# Patient Record
Sex: Male | Born: 1990 | Race: Black or African American | Hispanic: No | Smoking: Current every day smoker
Health system: Southern US, Community
[De-identification: ages and names within clinical notes are randomized; demographics above are authoritative.]

## PROBLEM LIST (undated history)

## (undated) ENCOUNTER — Emergency Department (HOSPITAL_COMMUNITY): Discharge status: Against Medical Advice | Payer: Self-pay

## (undated) DIAGNOSIS — F419 Anxiety disorder, unspecified: Secondary | ICD-10-CM

## (undated) DIAGNOSIS — F329 Major depressive disorder, single episode, unspecified: Secondary | ICD-10-CM

## (undated) DIAGNOSIS — I319 Disease of pericardium, unspecified: Secondary | ICD-10-CM

## (undated) DIAGNOSIS — F209 Schizophrenia, unspecified: Secondary | ICD-10-CM

## (undated) DIAGNOSIS — R079 Chest pain, unspecified: Secondary | ICD-10-CM

## (undated) DIAGNOSIS — F1020 Alcohol dependence, uncomplicated: Secondary | ICD-10-CM

## (undated) DIAGNOSIS — F32A Depression, unspecified: Secondary | ICD-10-CM

## (undated) HISTORY — DX: Alcohol dependence, uncomplicated: F10.20

## (undated) HISTORY — DX: Chest pain, unspecified: R07.9

## (undated) HISTORY — DX: Disease of pericardium, unspecified: I31.9

---

## 2011-12-08 ENCOUNTER — Emergency Department (HOSPITAL_COMMUNITY)
Admission: EM | Admit: 2011-12-08 | Discharge: 2011-12-08 | Disposition: A | Discharge status: Home / Self Care | Payer: Medicaid Other | Attending: Emergency Medicine | Admitting: Emergency Medicine

## 2011-12-08 ENCOUNTER — Encounter (HOSPITAL_COMMUNITY): Payer: Self-pay | Admitting: Emergency Medicine

## 2011-12-08 ENCOUNTER — Emergency Department (HOSPITAL_COMMUNITY): Payer: Medicaid Other

## 2011-12-08 DIAGNOSIS — R079 Chest pain, unspecified: Secondary | ICD-10-CM | POA: Insufficient documentation

## 2011-12-08 DIAGNOSIS — I319 Disease of pericardium, unspecified: Secondary | ICD-10-CM | POA: Insufficient documentation

## 2011-12-08 LAB — POCT I-STAT, CHEM 8
BUN: 13 mg/dL (ref 6–23)
Calcium, Ion: 1.15 mmol/L (ref 1.12–1.23)
Chloride: 103 mEq/L (ref 96–112)
Glucose, Bld: 84 mg/dL (ref 70–99)
TCO2: 23 mmol/L (ref 0–100)

## 2011-12-08 LAB — CBC WITH DIFFERENTIAL/PLATELET
Basophils Absolute: 0 10*3/uL (ref 0.0–0.1)
Basophils Relative: 1 % (ref 0–1)
Eosinophils Relative: 2 % (ref 0–5)
Lymphs Abs: 1.4 10*3/uL (ref 0.7–4.0)
MCH: 32.4 pg (ref 26.0–34.0)
Monocytes Absolute: 0.5 10*3/uL (ref 0.1–1.0)
Monocytes Relative: 10 % (ref 3–12)
Neutro Abs: 2.4 10*3/uL (ref 1.7–7.7)
Neutrophils Relative %: 55 % (ref 43–77)
Platelets: 116 10*3/uL — ABNORMAL LOW (ref 150–400)
RBC: 4.5 MIL/uL (ref 4.22–5.81)
WBC: 4.4 10*3/uL (ref 4.0–10.5)

## 2011-12-08 LAB — TROPONIN I: Troponin I: <0.3 ng/mL (ref <0.30)

## 2011-12-08 MED ORDER — NAPROXEN 375 MG PO TABS: 375.0000 mg | ORAL_TABLET | Freq: Two times a day (BID) | ORAL | Status: DC

## 2011-12-08 NOTE — ED Notes (Signed)
Report received-airway intact-no s/s's of distress-will continue to monitor 

## 2011-12-08 NOTE — ED Notes (Addendum)
Pt alert, nad,  arrives from home, c/o left rib pain, onset was 1 week  ago, denies trauma or injury, resp even unlabored, skin pwd, ambulates to triage, steady gait noted

## 2011-12-08 NOTE — ED Provider Notes (Signed)
History     CSN: [id number]  Arrival date & time 12/08/11  8295   First MD Initiated Contact with Patient 12/08/11 0542      Chief Complaint  Patient presents with  . Illegal value: [    Rib Pain    (Consider location/radiation/quality/duration/timing/severity/associated sxs/prior treatment) HPI  21 year old  male presents complaining of left rib pain. Patient reports for the past week he has been experiencing intermittent chest pain. Describe pain as a sharp stabbing sensation lasting for seconds and returned 30 minutes later pain initially started on his right chest but now has migrated over to his left chest. Nonradiating, with no associated nausea, diaphoresis, or shortness of breath. Pain is not alleviate or aggravate with positional change. As fever, chills, nausea, vomiting, diarrhea, abdominal pain, back pain, or rash. He denies any specific trauma.  No family history of premature cardiac death.  No hx of HTN or diabetes.  Patient denies IV drug use.  No hx of immunocompromise.  Pt initially took Peptol bismo thinking that it may be related to heart burn but it provides no relief.    History reviewed. No pertinent past medical history.  History reviewed. No pertinent past surgical history.  No family history on file.  History  Substance Use Topics  . Smoking status: Current Every Day Smoker    Types: Cigarettes  . Smokeless tobacco: Not on file  . Alcohol Use:       Review of Systems  All other systems reviewed and are negative.    Allergies  Review of patient's allergies indicates no known allergies.  Home Medications  No current outpatient prescriptions on file.  BP 116/72  Pulse 63  Temp 98.8 F (37.1 C)  Resp 17  Wt 140 lb (63.504 kg)  SpO2 98%  Physical Exam  Nursing note and vitals reviewed. Constitutional: He is oriented to person, place, and time. He appears well-developed and well-nourished. No distress.       Awake, alert, nontoxic  appearance  HENT:  Head: Atraumatic.  Eyes: Conjunctivae normal are normal. Right eye exhibits no discharge. Left eye exhibits no discharge.  Neck: Normal range of motion. Neck supple.  Cardiovascular: Normal rate and regular rhythm.  Exam reveals no gallop and no friction rub.   No murmur heard. Pulmonary/Chest: Effort normal. No respiratory distress. He exhibits no tenderness.  Abdominal: Soft. There is no tenderness. There is no rebound.  Musculoskeletal: He exhibits no edema and no tenderness.       ROM appears intact, no obvious focal weakness  Neurological: He is alert and oriented to person, place, and time.  Skin: Skin is warm and dry. No rash noted.  Psychiatric: He has a normal mood and affect.    ED Course  Procedures (including critical care time)  Labs Reviewed - No data to display Dg Chest 2 View  12/08/2011  *RADIOLOGY REPORT*  Clinical Data: Chest pain  CHEST - 2 VIEW  Comparison: None.  Findings: Lungs are clear. No pleural effusion or pneumothorax.  Cardiomediastinal silhouette is within normal limits.  Visualized osseous structures are within normal limits.  IMPRESSION: Normal chest radiographs.   Original Report Authenticated By: Charline Bills, M.D.     Results for orders placed during the hospital encounter of 12/08/11  TROPONIN I      Component Value Range   Troponin I <0.30  <0.30 ng/mL  CBC WITH DIFFERENTIAL      Component Value Range   WBC 4.4  4.0 - 10.5 K/uL   RBC 4.50  4.22 - 5.81 MIL/uL   Hemoglobin 14.6  13.0 - 17.0 g/dL   HCT 40.9  81.1 - 91.4 %   MCV 89.6  78.0 - 100.0 fL   MCH 32.4  26.0 - 34.0 pg   MCHC 36.2 (*) 30.0 - 36.0 g/dL   RDW 78.2  95.6 - 21.3 %   Platelets PENDING  150 - 400 K/uL   Neutrophils Relative 55  43 - 77 %   Neutro Abs 2.4  1.7 - 7.7 K/uL   Lymphocytes Relative 33  12 - 46 %   Lymphs Abs 1.4  0.7 - 4.0 K/uL   Monocytes Relative 10  3 - 12 %   Monocytes Absolute 0.5  0.1 - 1.0 K/uL   Eosinophils Relative 2  0 - 5 %     Eosinophils Absolute 0.1  0.0 - 0.7 K/uL   Basophils Relative 1  0 - 1 %   Basophils Absolute 0.0  0.0 - 0.1 K/uL  POCT I-STAT, CHEM 8      Component Value Range   Sodium 140  135 - 145 mEq/L   Potassium 3.6  3.5 - 5.1 mEq/L   Chloride 103  96 - 112 mEq/L   BUN 13  6 - 23 mg/dL   Creatinine, Ser 0.86  0.50 - 1.35 mg/dL   Glucose, Bld 84  70 - 99 mg/dL   Calcium, Ion 5.78  4.69 - 1.23 mmol/L   TCO2 23  0 - 100 mmol/L   Hemoglobin 14.6  13.0 - 17.0 g/dL   HCT 62.9  52.8 - 41.3 %   Dg Chest 2 View  12/08/2011  *RADIOLOGY REPORT*  Clinical Data: Chest pain  CHEST - 2 VIEW  Comparison: None.  Findings: Lungs are clear. No pleural effusion or pneumothorax.  Cardiomediastinal silhouette is within normal limits.  Visualized osseous structures are within normal limits.  IMPRESSION: Normal chest radiographs.   Original Report Authenticated By: Charline Bills, M.D.        Date: 12/08/2011  Rate: 74  Rhythm: normal sinus rhythm  QRS Axis: normal  Intervals: normal  ST/T Wave abnormalities: nonspecific ST/T changes  Conduction Disutrbances:none  Narrative Interpretation: diffused ST elevation suggests pericarditis  Old EKG Reviewed: none available  1. pericarditis  MDM  Presents with atypical chest pain. Pain is nonreproducible on exam. EKG shows diffuse ST elevation which suggested pericarditis. However, patient reports no symptoms changes with positional changes.  He is currently chest pain free.  Is afebrile and VSS.  No other red flags finding.     7:19 AM Pt remains CP free with normal stable VS.  Since ECG changes suggestive of pericarditis, but pt has normal WBC, normal electrolytes, normal troponin and no active pain, will d/c with NSAIDs.  Strict return precaution.  Will refer to cardiology for further care.    BP 116/72  Pulse 63  Temp 98.8 F (37.1 C)  Resp 17  Wt 140 lb (63.504 kg)  SpO2 98%         Fayrene Helper, PA-C 12/08/11 [phone]

## 2011-12-09 NOTE — ED Provider Notes (Signed)
Medical screening examination/treatment/procedure(s) were performed by non-physician practitioner and as supervising physician I was immediately available for consultation/collaboration.  Tobin Chad, MD 12/09/11 [phone]

## 2012-01-03 ENCOUNTER — Encounter: Payer: Self-pay | Admitting: *Deleted

## 2012-01-03 ENCOUNTER — Ambulatory Visit: Payer: Self-pay | Admitting: Cardiology

## 2012-01-03 ENCOUNTER — Encounter: Payer: Self-pay | Admitting: Cardiology

## 2015-07-24 ENCOUNTER — Encounter (HOSPITAL_COMMUNITY): Payer: Self-pay | Admitting: *Deleted

## 2015-07-24 ENCOUNTER — Emergency Department (HOSPITAL_COMMUNITY)
Admission: EM | Admit: 2015-07-24 | Discharge: 2015-07-24 | Disposition: A | Discharge status: Home / Self Care | Payer: Medicaid Other | Attending: Emergency Medicine | Admitting: Emergency Medicine

## 2015-07-24 ENCOUNTER — Emergency Department (HOSPITAL_COMMUNITY): Payer: Medicaid Other

## 2015-07-24 DIAGNOSIS — Y929 Unspecified place or not applicable: Secondary | ICD-10-CM | POA: Diagnosis not present

## 2015-07-24 DIAGNOSIS — F17210 Nicotine dependence, cigarettes, uncomplicated: Secondary | ICD-10-CM | POA: Diagnosis not present

## 2015-07-24 DIAGNOSIS — Z791 Long term (current) use of non-steroidal anti-inflammatories (NSAID): Secondary | ICD-10-CM | POA: Insufficient documentation

## 2015-07-24 DIAGNOSIS — Y939 Activity, unspecified: Secondary | ICD-10-CM | POA: Insufficient documentation

## 2015-07-24 DIAGNOSIS — Y999 Unspecified external cause status: Secondary | ICD-10-CM | POA: Insufficient documentation

## 2015-07-24 DIAGNOSIS — W25XXXA Contact with sharp glass, initial encounter: Secondary | ICD-10-CM | POA: Diagnosis not present

## 2015-07-24 DIAGNOSIS — S61411A Laceration without foreign body of right hand, initial encounter: Secondary | ICD-10-CM

## 2015-07-24 DIAGNOSIS — Z23 Encounter for immunization: Secondary | ICD-10-CM | POA: Diagnosis not present

## 2015-07-24 MED ORDER — BACITRACIN ZINC 500 UNIT/GM EX OINT: 1.0000 "application " | TOPICAL_OINTMENT | Freq: Two times a day (BID) | CUTANEOUS | Status: DC

## 2015-07-24 MED ORDER — LIDOCAINE HCL 2 % IJ SOLN
INTRAMUSCULAR | Status: AC
  Administered 2015-07-24: 400 mg
  Filled 2015-07-24: qty 20

## 2015-07-24 MED ORDER — TETANUS-DIPHTH-ACELL PERTUSSIS 5-2.5-18.5 LF-MCG/0.5 IM SUSP
0.5000 mL | Freq: Once | INTRAMUSCULAR | Status: AC
  Administered 2015-07-24: 0.5 mL via INTRAMUSCULAR
  Filled 2015-07-24: qty 0.5

## 2015-07-24 MED ORDER — NAPROXEN 500 MG PO TABS: 500.0000 mg | ORAL_TABLET | Freq: Two times a day (BID) | ORAL | Status: DC

## 2015-07-24 NOTE — Discharge Instructions (Signed)

## 2015-07-24 NOTE — ED Notes (Signed)
Patient d/c'd self care.  F/U and medications discussed.  Patient verbalized understanding. 

## 2015-07-24 NOTE — ED Notes (Signed)
Pt states that he cut his right hand on a glass candle holder that broke; pt with laceration to rt palm and rt ring finger

## 2015-07-24 NOTE — ED Provider Notes (Signed)
CSN: [id number]     Arrival date & time 07/24/15  0133 History   First MD Initiated Contact with Patient 07/24/15 0258     Chief Complaint  Patient presents with  . Extremity Laceration     (Consider location/radiation/quality/duration/timing/severity/associated sxs/prior Treatment) Patient is a 25 y.o. male presenting with skin laceration. The history is provided by the patient. No language interpreter was used.  Laceration Location:  Hand Hand laceration location:  R hand Length (cm):  4 Depth:  Through dermis Quality: jagged   Bleeding: controlled   Laceration mechanism:  Broken glass Pain details:    Quality:  Aching and throbbing   Severity:  Mild   Timing:  Constant   Progression:  Unchanged Foreign body present:  No foreign bodies Relieved by:  Nothing Ineffective treatments:  None tried Tetanus status:  Unknown   Past Medical History  Diagnosis Date  . Pericarditis   . Chest pain    History reviewed. No pertinent past surgical history. No family history on file. Social History  Substance Use Topics  . Smoking status: Current Every Day Smoker    Types: Cigarettes  . Smokeless tobacco: None  . Alcohol Use: None    Review of Systems  Musculoskeletal: Positive for myalgias.  Skin: Positive for wound.  Neurological: Negative for weakness and numbness.  All other systems reviewed and are negative.   Allergies  Review of patient's allergies indicates no known allergies.  Home Medications   Prior to Admission medications   Medication Sig Start Date End Date Taking? Authorizing Provider  bacitracin ointment Apply 1 application topically 2 (two) times daily. 07/24/15   Antony Madura, PA-C  naproxen (NAPROSYN) 500 MG tablet Take 1 tablet (500 mg total) by mouth 2 (two) times daily. 07/24/15   Antony Madura, PA-C   BP 133/88 mmHg  Pulse 78  Temp(Src) 98.8 F (37.1 C) (Oral)  Resp 13  Ht  (1.727 m)  Wt 63.05 kg  BMI 21.14 kg/m2  SpO2 100%   Physical  Exam  Constitutional: He is oriented to person, place, and time. He appears well-developed and well-nourished. No distress.  Nontoxic/nonseptic appearing  HENT:  Head: Normocephalic and atraumatic.  Eyes: Conjunctivae and EOM are normal. No scleral icterus.  Neck: Normal range of motion.  Cardiovascular: Normal rate, regular rhythm and intact distal pulses.   Distal radial pulse 2+ in the RUE. Capillary refill brisk in all digits.  Pulmonary/Chest: Effort normal. No respiratory distress.  Respirations even and unlabored  Musculoskeletal: Normal range of motion.       Right wrist: Normal.       Right hand: He exhibits tenderness and laceration. He exhibits normal range of motion, normal capillary refill and no deformity. Normal sensation noted. Normal strength noted.       Hands: Neurological: He is alert and oriented to person, place, and time. He exhibits normal muscle tone. Coordination normal.  GCS 15. Patient able to wiggle all fingers. Normal grips in the right hand.  Skin: Skin is warm and dry. No rash noted. He is not diaphoretic. No erythema. No pallor.  Psychiatric: He has a normal mood and affect. His behavior is normal.  Nursing note and vitals reviewed.   ED Course  Procedures (including critical care time) Labs Review Labs Reviewed - No data to display  Imaging Review Dg Hand Complete Right  07/24/2015  CLINICAL DATA:  Laceration with glass. Injury on glass candle holder. EXAM: RIGHT HAND - COMPLETE 3+ VIEW COMPARISON:  None. FINDINGS: No fracture or dislocation. The alignment and joint spaces are maintained. No radiopaque foreign body. A dressing overlies the palm are hand. IMPRESSION: No radiopaque foreign body or osseous abnormality. No evidence of retained glass. Electronically Signed   By: Rubye OaksMelanie  Ehinger M.D.   On: 07/24/2015 03:21   I have personally reviewed and evaluated these images and lab results as part of my medical decision-making.   EKG  Interpretation None       LACERATION REPAIR Performed by: Antony MaduraHUMES, Keyion Knack Authorized by: Antony MaduraHUMES, Christal Lagerstrom Consent: Verbal consent obtained. Risks and benefits: risks, benefits and alternatives were discussed Consent given by: patient Patient identity confirmed: provided demographic data Prepped and Draped in normal sterile fashion Wound explored  Laceration Location: R hand; lateral 2nd digit, proximal  Laceration Length: 4cm  No Foreign Bodies seen or palpated  Anesthesia: local infiltration  Local anesthetic: lidocaine 2% without epinephrine  Anesthetic total: 4ml  Irrigation method: syringe Amount of cleaning: standard  Skin closure: 4-0 prolene  Number of sutures: 5  Technique: simple interrupted  Patient tolerance: Patient tolerated the procedure well with no immediate complications.  MDM   Final diagnoses:  Laceration of right hand, initial encounter    Tdap booster given. Pressure irrigation performed. Laceration occurred < 8 hours prior to repair which was well tolerated. Pt has no comorbidities to effect normal wound healing. Discussed suture home care with pt and answered questions. Pt to follow up for wound check and suture removal in 10 days. Pt is hemodynamically stable with no complaints prior to discharge.     Filed Vitals:   07/24/15 0151  BP: 133/88  Pulse: 78  Temp: 98.8 F (37.1 C)  TempSrc: Oral  Resp: 13  Height: 5\' 8"  (1.727 m)  Weight: 63.05 kg  SpO2: 100%     Antony MaduraKelly Osvaldo Lamping, PA-C 07/24/15 0439  April Palumbo, MD 07/24/15 [phone]

## 2015-08-03 ENCOUNTER — Encounter (HOSPITAL_COMMUNITY): Payer: Self-pay | Admitting: Emergency Medicine

## 2015-08-03 ENCOUNTER — Emergency Department (HOSPITAL_COMMUNITY)
Admission: EM | Admit: 2015-08-03 | Discharge: 2015-08-03 | Disposition: A | Discharge status: Home / Self Care | Payer: Medicaid Other | Attending: Emergency Medicine | Admitting: Emergency Medicine

## 2015-08-03 DIAGNOSIS — Z4802 Encounter for removal of sutures: Secondary | ICD-10-CM | POA: Diagnosis not present

## 2015-08-03 DIAGNOSIS — F17210 Nicotine dependence, cigarettes, uncomplicated: Secondary | ICD-10-CM | POA: Diagnosis not present

## 2015-08-03 NOTE — ED Provider Notes (Signed)
CSN: [id number]     Arrival date & time 08/03/15  1411 History  By signing my name below, I, Chris Olson, attest that this documentation has been prepared under the direction and in the presence of Chris Tibbitts, PA-C.  Electronically Signed: Tanda RockersMargaux Olson, ED Scribe. 08/03/2015. 2:37 PM.   No chief complaint on file.  The history is provided by the patient. No language interpreter was used.    HPI Comments: Chris Olson is a 25 y.o. male who presents to the Emergency Department for suture removal. Pt was seen in the ED on 07/24/2015 (approximately 10 days ago) for laceration to right hand. Pt had 5 sutures placed at that time and was told to return in 10 days for suture removal. Pt reports numbness down his finger where the sutures are placed but otherwise the wound is healing well. Pt is able to flex and extend finger. Pt states he has not used any ointments on his finger. Denies drainage, swelling, or pain.   Past Medical History  Diagnosis Date  . Pericarditis   . Chest pain    No past surgical history on file. No family history on file. Social History  Substance Use Topics  . Smoking status: Current Every Day Smoker    Types: Cigarettes  . Smokeless tobacco: Not on file  . Alcohol Use: Not on file    Review of Systems  Constitutional: Negative for fever.  Musculoskeletal: Negative for joint swelling and arthralgias.  Skin: Negative for color change.    Allergies  Review of patient's allergies indicates no known allergies.  Home Medications   Prior to Admission medications   Medication Sig Start Date End Date Taking? Authorizing Provider  bacitracin ointment Apply 1 application topically 2 (two) times daily. 07/24/15   Antony MaduraKelly Humes, PA-C  naproxen (NAPROSYN) 500 MG tablet Take 1 tablet (500 mg total) by mouth 2 (two) times daily. 07/24/15   Antony MaduraKelly Humes, PA-C   There were no vitals taken for this visit. Physical Exam  Constitutional: He is oriented to person,  place, and time. He appears well-developed and well-nourished. No distress.  HENT:  Head: Normocephalic and atraumatic.  Eyes: Conjunctivae and EOM are normal.  Neck: Neck supple. No tracheal deviation present.  Cardiovascular: Normal rate.   Pulmonary/Chest: Effort normal. No respiratory distress.  Musculoskeletal: Normal range of motion.  Healing laceration to the right hand, specifically over the palmar surface of the hand just proximal to the index finger. 5 sutures are intact. No erythema, swelling, drainage. No dehiscence. Full range of motion of the right index finger and strength is intact with flexion and extension at all joints. There is some decreased sensation to the radial aspect of the finger which patient states does not bother him.  Neurological: He is alert and oriented to person, place, and time.  Skin: Skin is warm and dry.  Psychiatric: He has a normal mood and affect. His behavior is normal.  Nursing note and vitals reviewed.   ED Course  Procedures (including critical care time)  DIAGNOSTIC STUDIES: Oxygen Saturation is 100% on RA, normal by my interpretation.    COORDINATION OF CARE: 2:37 PM-Discussed treatment plan which includes suture removal with pt at bedside and pt agreed to plan.   SUTURE REMOVAL Performed by: Jaynie CrumbleKIRICHENKO, Milee Qualls A Consent: Verbal consent obtained. Patient identity confirmed: provided demographic data Time out: Immediately prior to procedure a "time out" was called to verify the correct patient, procedure, equipment, support staff and site/side marked as required.  Location: right hand Wound Appearance: clean Sutures/Staples Removed: 5 Patient tolerance: Patient tolerated the procedure well with no immediate complications.      EKG Interpretation None     MDM   Final diagnoses:  Visit for suture removal    Patient emergency department for suture removal, 10 days after placement. He does have some numbness to the finger  however he states he does not want to follow-up with hand specialist. Sutures removed. Wound is healing well. We'll discharge home with follow-up as needed.  Filed Vitals:   08/03/15 1420  BP: 134/71  Pulse: 95  Temp: 98.2 F (36.8 C)  TempSrc: Oral  Resp: 16  SpO2: 100%    I personally performed the services described in this documentation, which was scribed in my presence. The recorded information has been reviewed and is accurate.    Jaynie Crumble, PA-C 08/03/15 2145  Lorre Nick, MD 08/04/15 [phone]

## 2015-08-03 NOTE — ED Notes (Signed)
Pt here for suture removal from right hand  

## 2015-08-03 NOTE — Discharge Instructions (Signed)
Continue to keep hand clean. Wash with soap and water. Apply triple antibiotic ointment twice a day until healed. Follow up with your doctor as needed.    Suture Removal, Care After Refer to this sheet in the next few weeks. These instructions provide you with information on caring for yourself after your procedure. Your health care provider may also give you more specific instructions. Your treatment has been planned according to current medical practices, but problems sometimes occur. Call your health care provider if you have any problems or questions after your procedure. WHAT TO EXPECT AFTER THE PROCEDURE After your stitches (sutures) are removed, it is typical to have the following:  Some discomfort and swelling in the wound area.  Slight redness in the area. HOME CARE INSTRUCTIONS   If you have skin adhesive strips over the wound area, do not take the strips off. They will fall off on their own in a few days. If the strips remain in place after 14 days, you may remove them.  Change any bandages (dressings) at least once a day or as directed by your health care provider. If the bandage sticks, soak it off with warm, soapy water.  Apply cream or ointment only as directed by your health care provider. If using cream or ointment, wash the area with soap and water 2 times a day to remove all the cream or ointment. Rinse off the soap and pat the area dry with a clean towel.  Keep the wound area dry and clean. If the bandage becomes wet or dirty, or if it develops a bad smell, change it as soon as possible.  Continue to protect the wound from injury.  Use sunscreen when out in the sun. New scars become sunburned easily. SEEK MEDICAL CARE IF:  You have increasing redness, swelling, or pain in the wound.  You see pus coming from the wound.  You have a fever.  You notice a bad smell coming from the wound or dressing.  Your wound breaks open (edges not staying together).   This  information is not intended to replace advice given to you by your health care provider. Make sure you discuss any questions you have with your health care provider.   Document Released: 11/07/2000 Document Revised: 12/03/2012 Document Reviewed: 09/24/2012 Elsevier Interactive Patient Education [employer]2016 [employer]

## 2015-09-22 ENCOUNTER — Emergency Department (HOSPITAL_COMMUNITY): Payer: Medicaid Other

## 2015-09-22 ENCOUNTER — Encounter (HOSPITAL_COMMUNITY): Payer: Self-pay | Admitting: Emergency Medicine

## 2015-09-22 ENCOUNTER — Emergency Department (HOSPITAL_COMMUNITY)
Admission: EM | Admit: 2015-09-22 | Discharge: 2015-09-22 | Disposition: A | Discharge status: Home / Self Care | Payer: Medicaid Other | Attending: Physician Assistant | Admitting: Physician Assistant

## 2015-09-22 DIAGNOSIS — M545 Low back pain: Secondary | ICD-10-CM | POA: Diagnosis not present

## 2015-09-22 DIAGNOSIS — R0789 Other chest pain: Secondary | ICD-10-CM | POA: Insufficient documentation

## 2015-09-22 DIAGNOSIS — R0602 Shortness of breath: Secondary | ICD-10-CM | POA: Diagnosis not present

## 2015-09-22 DIAGNOSIS — R109 Unspecified abdominal pain: Secondary | ICD-10-CM | POA: Diagnosis not present

## 2015-09-22 DIAGNOSIS — F17210 Nicotine dependence, cigarettes, uncomplicated: Secondary | ICD-10-CM | POA: Diagnosis not present

## 2015-09-22 DIAGNOSIS — R079 Chest pain, unspecified: Secondary | ICD-10-CM

## 2015-09-22 LAB — CBC
HCT: 40.7 % (ref 39.0–52.0)
Hemoglobin: 14.4 g/dL (ref 13.0–17.0)
MCH: 32.8 pg (ref 26.0–34.0)
MCHC: 35.4 g/dL (ref 30.0–36.0)
MCV: 92.7 fL (ref 78.0–100.0)
PLATELETS: 124 10*3/uL — AB (ref 150–400)
RBC: 4.39 MIL/uL (ref 4.22–5.81)
RDW: 11.9 % (ref 11.5–15.5)
WBC: 5.1 10*3/uL (ref 4.0–10.5)

## 2015-09-22 LAB — BASIC METABOLIC PANEL
Anion gap: 6 (ref 5–15)
BUN: 17 mg/dL (ref 6–20)
CALCIUM: 9 mg/dL (ref 8.9–10.3)
CO2: 26 mmol/L (ref 22–32)
CREATININE: 0.96 mg/dL (ref 0.61–1.24)
Chloride: 107 mmol/L (ref 101–111)
Glucose, Bld: 89 mg/dL (ref 65–99)
Potassium: 3.7 mmol/L (ref 3.5–5.1)
SODIUM: 139 mmol/L (ref 135–145)

## 2015-09-22 LAB — URINALYSIS, ROUTINE W REFLEX MICROSCOPIC
BILIRUBIN URINE: NEGATIVE
GLUCOSE, UA: NEGATIVE mg/dL
Hgb urine dipstick: NEGATIVE
KETONES UR: NEGATIVE mg/dL
Leukocytes, UA: NEGATIVE
NITRITE: NEGATIVE
PH: 6 (ref 5.0–8.0)
Protein, ur: NEGATIVE mg/dL
Specific Gravity, Urine: 1.026 (ref 1.005–1.030)

## 2015-09-22 LAB — I-STAT TROPONIN, ED: TROPONIN I, POC: 0 ng/mL (ref 0.00–0.08)

## 2015-09-22 MED ORDER — KETOROLAC TROMETHAMINE 60 MG/2ML IM SOLN
60.0000 mg | Freq: Once | INTRAMUSCULAR | Status: AC
  Administered 2015-09-22: 60 mg via INTRAMUSCULAR
  Filled 2015-09-22: qty 2

## 2015-09-22 MED ORDER — INDOMETHACIN 50 MG PO CAPS: 50.0000 mg | ORAL_CAPSULE | Freq: Two times a day (BID) | ORAL | 0 refills | Status: DC

## 2015-09-22 NOTE — Discharge Instructions (Signed)
Your lab work and urinalysis are normal today. Take indocin for pain and inflammation as prescribed. Follow up with family doctor or cardiology.

## 2015-09-22 NOTE — ED Triage Notes (Signed)
Pt reports intermittent central and L side CP for the past week accompanied by SOB/dizziness. Also reports L flank pain, no dysuria.

## 2015-09-22 NOTE — ED Provider Notes (Signed)
[facility]-EMERGENCY DEPT Provider Note   CSN: [id number] Arrival date & time: 09/22/15  1254  First Provider Contact:  None       History   Chief Complaint Chief Complaint  Patient presents with  . Chest Pain    HPI Chris Olson is a 25 y.o. male.  Chris Olson is a 25 y.o. Male with hx of chest pain and pericarditis, presents to ED with complaint of chest pain for several weeks and bilateral lower back pain. Pt states pain comes and goes. Sharp. Reports associated shortness of breath and dizziness that comes and goes. Denies trying any medications. Denies fever or chills. States started new job, works with Web [employer]. States sometimes inhales fiberglass dust. He is also a current smoker. Denies any travel or surgeries. Denies any swelling in extremities. Diagnosed with possible pericarditis several years ago. Patient also reports bilateral lower back pain, states "my kidneys hurt." States that comes and goes as well. Reports sometimes feels like he has to push to urinate. Denies dysuria, frequency, urgency. No penile discharge.        Past Medical History:  Diagnosis Date  . Chest pain   . Pericarditis     There are no active problems to display for this patient.   History reviewed. No pertinent surgical history.     Home Medications    Prior to Admission medications   Medication Sig Start Date End Date Taking? Authorizing Provider  bacitracin ointment Apply 1 application topically 2 (two) times daily. Patient not taking: Reported on 09/22/2015 07/24/15   Antony Madura, PA-C  naproxen (NAPROSYN) 500 MG tablet Take 1 tablet (500 mg total) by mouth 2 (two) times daily. Patient not taking: Reported on 09/22/2015 07/24/15   Antony Madura, PA-C    Family History History reviewed. No pertinent family history.  Social History Social History  Substance Use Topics  . Smoking status: Current Every Day Smoker    Types: Cigarettes  . Smokeless tobacco: Not on file  .  Alcohol use Not on file     Allergies   Review of patient's allergies indicates no known allergies.   Review of Systems Review of Systems  Constitutional: Negative for chills and fever.  Respiratory: Positive for chest tightness and shortness of breath. Negative for cough.   Cardiovascular: Positive for chest pain. Negative for palpitations and leg swelling.  Gastrointestinal: Negative for abdominal distention, abdominal pain, diarrhea, nausea and vomiting.  Genitourinary: Positive for flank pain. Negative for dysuria, frequency, hematuria and urgency.  Musculoskeletal: Negative for arthralgias, myalgias, neck pain and neck stiffness.  Skin: Negative for rash.  Allergic/Immunologic: Negative for immunocompromised state.  Neurological: Positive for light-headedness. Negative for dizziness, weakness, numbness and headaches.  All other systems reviewed and are negative.    Physical Exam Updated Vital Signs BP 127/91   Pulse 87   Temp 98.1 F (36.7 C) (Oral)   Resp 16   SpO2 100%   Physical Exam  Constitutional: He appears well-developed and well-nourished. No distress.  HENT:  Head: Normocephalic and atraumatic.  Eyes: Conjunctivae are normal.  Neck: Neck supple.  Cardiovascular: Normal rate, regular rhythm and normal heart sounds.   Pulmonary/Chest: Effort normal. No respiratory distress. He has no wheezes. He has no rales. He exhibits no tenderness.  Abdominal: Soft. Bowel sounds are normal. He exhibits no distension. There is no tenderness. There is no rebound.  Musculoskeletal: He exhibits no edema.  Neurological: He is alert.  Skin: Skin is warm and dry.  Nursing note  and vitals reviewed.    ED Treatments / Results  Labs (all labs ordered are listed, but only abnormal results are displayed) Labs Reviewed  CBC - Abnormal; Notable for the following:       Result Value   Platelets 124 (*)    All other components within normal limits  BASIC METABOLIC PANEL    URINALYSIS, ROUTINE W REFLEX MICROSCOPIC (NOT AT [facility])  I-STAT TROPOININ, ED  GC/CHLAMYDIA PROBE AMP ([place]) NOT AT [facility]    EKG  EKG Interpretation  Date/Time:  Thursday September 22 2015 13:08:49 EDT Ventricular Rate:  80 PR Interval:    QRS Duration: 91 QT Interval:  365 QTC Calculation: 421 R Axis:   97 Text Interpretation:  Sinus rhythm Inferior infarct, acute (LCx) ST elevation, consider anterior injury Lateral leads are also involved No significant change since last tracing Confirmed by Kandis Mannan ([id number]) on 09/22/2015 3:19:33 PM       Radiology Dg Chest 2 View  Result Date: 09/22/2015 CLINICAL DATA:  Left-sided chest pain with shortness of breath EXAM: CHEST  2 VIEW COMPARISON:  December 08, 2011 FINDINGS: Lungs are clear. Heart size and pulmonary vascularity are normal. No adenopathy. No pneumothorax. No bone lesions. IMPRESSION: No edema or consolidation. Electronically Signed   By: Bretta Bang III M.D.   On: 09/22/2015 13:39   Procedures Procedures (including critical care time)  Medications Ordered in ED Medications  ketorolac (TORADOL) injection 60 mg (60 mg Intramuscular Given 09/22/15 1544)     Initial Impression / Assessment and Plan / ED Course  I have reviewed the triage vital signs and the nursing notes.  Pertinent labs & imaging results that were available during my care of the patient were reviewed by me and considered in my medical decision making (see chart for details).  Clinical Course   Pt in ED with left sided chest pain and bilateral lower back pain. Pt's EKG shows diffuse ST elevation, most consistent with early repolarization. Consider pericarditis. Patient's chest pain is not positional. He denies any fever or chills. Lungs are clear. Chest x-ray, labs including troponin negative. PERC negative, doubt PE. Patient just started new job, wondering if his pain is musculoskeletal. Will start on NSAIDs. Urinalysis negative. Most likely  muscular skeletal back pain as well. We'll have him follow-up with family doctor.   Final Clinical Impressions(s) / ED Diagnoses   Final diagnoses:  Nonspecific chest pain    New Prescriptions Discharge Medication List as of 09/22/2015  4:20 PM    START taking these medications   Details  indomethacin (INDOCIN) 50 MG capsule Take 1 capsule (50 mg total) by mouth 2 (two) times daily with a meal., Starting Thu 09/22/2015, Print         Jaynie Crumble, PA-C 09/22/15 2155    Courteney Lyn Mackuen, MD 09/22/15 2328

## 2015-09-23 LAB — GC/CHLAMYDIA PROBE AMP (~~LOC~~) NOT AT ARMC
CHLAMYDIA, DNA PROBE: NEGATIVE
NEISSERIA GONORRHEA: NEGATIVE

## 2015-10-17 ENCOUNTER — Emergency Department (HOSPITAL_COMMUNITY)
Admission: EM | Admit: 2015-10-17 | Discharge: 2015-10-17 | Discharge status: Against Medical Advice | Payer: Medicaid Other | Attending: Emergency Medicine | Admitting: Emergency Medicine

## 2015-10-17 DIAGNOSIS — Z791 Long term (current) use of non-steroidal anti-inflammatories (NSAID): Secondary | ICD-10-CM | POA: Insufficient documentation

## 2015-10-17 DIAGNOSIS — F17210 Nicotine dependence, cigarettes, uncomplicated: Secondary | ICD-10-CM | POA: Insufficient documentation

## 2015-10-17 DIAGNOSIS — R51 Headache: Secondary | ICD-10-CM | POA: Insufficient documentation

## 2015-10-17 DIAGNOSIS — R519 Headache, unspecified: Secondary | ICD-10-CM

## 2015-10-17 NOTE — ED Triage Notes (Addendum)
Pt states that he has had a headache since yesterday and also L elbow pain upon movement. Also reports chills last night. Afebrile here. Alert and oriented. Neuro intact.

## 2015-10-17 NOTE — ED Notes (Signed)
Pt walked out without notifying staff; left girlfriend/significant other and children in room.  Gave patient several minutes to come back; pt never came back.

## 2015-10-18 NOTE — ED Provider Notes (Signed)
Went to evaluate patient and found to no longer be in room. Told by RN that patient had eloped. I did not participate in the care or medical decision making of this patient.  Vitals:   10/17/15 2121 10/17/15 2217  BP: 117/75 123/76  Pulse: 79 63  Resp: 18 20  Temp: 98.4 F (36.9 C)   TempSrc: Oral   SpO2: 100% 100%  Weight: 63.5 kg   Height: 5\' 8"  (1.727 m)       Antony MaduraKelly Carlton Buskey, PA-C 10/18/15 [id number]    Tomasita CrumbleAdeleke Oni, MD 10/18/15 [phone]

## 2016-09-11 ENCOUNTER — Encounter: Payer: Self-pay | Admitting: Family Medicine

## 2016-09-11 ENCOUNTER — Ambulatory Visit (INDEPENDENT_AMBULATORY_CARE_PROVIDER_SITE_OTHER): Payer: Medicaid Other | Admitting: Family Medicine

## 2016-09-11 VITALS — BP 112/72 | HR 77 | Temp 98.7°F | Resp 14 | Ht 68.0 in | Wt 130.2 lb

## 2016-09-11 DIAGNOSIS — Z1329 Encounter for screening for other suspected endocrine disorder: Secondary | ICD-10-CM

## 2016-09-11 DIAGNOSIS — F39 Unspecified mood [affective] disorder: Secondary | ICD-10-CM | POA: Diagnosis not present

## 2016-09-11 DIAGNOSIS — Z131 Encounter for screening for diabetes mellitus: Secondary | ICD-10-CM | POA: Diagnosis not present

## 2016-09-11 DIAGNOSIS — Z113 Encounter for screening for infections with a predominantly sexual mode of transmission: Secondary | ICD-10-CM | POA: Diagnosis not present

## 2016-09-11 DIAGNOSIS — Z130 Encounter for screening for diseases of the blood and blood-forming organs and certain disorders involving the immune mechanism: Secondary | ICD-10-CM | POA: Diagnosis not present

## 2016-09-11 LAB — POCT URINALYSIS DIP (DEVICE)
BILIRUBIN URINE: NEGATIVE
Glucose, UA: NEGATIVE mg/dL
HGB URINE DIPSTICK: NEGATIVE
Ketones, ur: NEGATIVE mg/dL
Leukocytes, UA: NEGATIVE
NITRITE: NEGATIVE
PH: 7 (ref 5.0–8.0)
Protein, ur: NEGATIVE mg/dL
Specific Gravity, Urine: 1.01 (ref 1.005–1.030)
UROBILINOGEN UA: 0.2 mg/dL (ref 0.0–1.0)

## 2016-09-11 NOTE — Patient Instructions (Signed)
Exercising to Stay Healthy Exercising regularly is important. It has many health benefits, such as:  Improving your overall fitness, flexibility, and endurance.  Increasing your bone density.  Helping with weight control.  Decreasing your body fat.  Increasing your muscle strength.  Reducing stress and tension.  Improving your overall health.  In order to become healthy and stay healthy, it is recommended that you do moderate-intensity and vigorous-intensity exercise. You can tell that you are exercising at a moderate intensity if you have a higher heart rate and faster breathing, but you are still able to hold a conversation. You can tell that you are exercising at a vigorous intensity if you are breathing much harder and faster and cannot hold a conversation while exercising. How often should I exercise? Choose an activity that you enjoy and set realistic goals. Your health care provider can help you to make an activity plan that works for you. Exercise regularly as directed by your health care provider. This may include:  Doing resistance training twice each week, such as: ? Push-ups. ? Sit-ups. ? Lifting weights. ? Using resistance bands.  Doing a given intensity of exercise for a given amount of time. Choose from these options: ? 150 minutes of moderate-intensity exercise every week. ? 75 minutes of vigorous-intensity exercise every week. ? A mix of moderate-intensity and vigorous-intensity exercise every week.  Children, pregnant women, people who are out of shape, people who are overweight, and older adults may need to consult a health care provider for individual recommendations. If you have any sort of medical condition, be sure to consult your health care provider before starting a new exercise program. What are some exercise ideas? Some moderate-intensity exercise ideas include:  Walking at a rate of 1 mile in 15  minutes.  Biking.  Hiking.  Golfing.  Dancing.  Some vigorous-intensity exercise ideas include:  Walking at a rate of at least 4.5 miles per hour.  Jogging or running at a rate of 5 miles per hour.  Biking at a rate of at least 10 miles per hour.  Lap swimming.  Roller-skating or in-line skating.  Cross-country skiing.  Vigorous competitive sports, such as football, basketball, and soccer.  Jumping rope.  Aerobic dancing.  What are some everyday activities that can help me to get exercise?  Yard work, such as: ? Pushing a lawn mower. ? Raking and bagging leaves.  Washing and waxing your car.  Pushing a stroller.  Shoveling snow.  Gardening.  Washing windows or floors. How can I be more active in my day-to-day activities?  Use the stairs instead of the elevator.  Take a walk during your lunch break.  If you drive, park your car farther away from work or school.  If you take public transportation, get off one stop early and walk the rest of the way.  Make all of your phone calls while standing up and walking around.  Get up, stretch, and walk around every 30 minutes throughout the day. What guidelines should I follow while exercising?  Do not exercise so much that you hurt yourself, feel dizzy, or get very short of breath.  Consult your health care provider before starting a new exercise program.  Wear comfortable clothes and shoes with good support.  Drink plenty of water while you exercise to prevent dehydration or heat stroke. Body water is lost during exercise and must be replaced.  Work out until you breathe faster and your heart beats faster. This information is not   intended to replace advice given to you by your health care provider. Make sure you discuss any questions you have with your health care provider. Document Released: 03/17/2010 Document Revised: 07/21/2015 Document Reviewed: 07/16/2013 Elsevier Interactive Patient Education  2018  Elsevier Inc.  

## 2016-09-11 NOTE — Progress Notes (Signed)
Patient ID: Chris Olson, male    DOB: 10/13/1990, 26 y.o.   MRN: [id number]  PCP: Bing NeighborsHarris, Damisha Wolff S, FNP  Chief Complaint  Patient presents with  . Establish Care    Subjective:  HPI Chris Olson is a 26 y.o. male presents to establish care today. Medical history pericarditis and mood disorder. [facility] and prescribed Seroquel 100 mg daily at bedtime and [facility] and medication management. He denies any present anxiety or depression. He denies any other complaints today.Maxum is a current everyday smoker and reports on average that he smokes less than a half a pack per day. Chronic user of marijuana. Denies any history of asthma, chest tightness, or chronic cough. He has a positive family history of diabetes, mother is insulin-dependent and grandmother had diabetes. His mother also suffers from hypertension. He request STI testing today. Social History   Social History  . Marital status: Single    Spouse name: N/A  . Number of children: N/A  . Years of education: N/A   Occupational History  . Not on file.   Social History Main Topics  . Smoking status: Current Every Day Smoker    Types: Cigarettes  . Smokeless tobacco: Never Used  . Alcohol use Yes  . Drug use: Yes    Types: Marijuana  . Sexual activity: Not on file   Other Topics Concern  . Not on file   Social History Narrative  . No narrative on file    No family history on file.   Review of Systems  HENT: Negative.   Respiratory: Negative.   Cardiovascular: Negative.   Genitourinary: Negative.   Musculoskeletal: Negative.   Skin: Negative.   Neurological: Negative.   Psychiatric/Behavioral: Negative.     There are no active problems to display for this patient.   No Known Allergies  Prior to Admission medications   Medication Sig Start Date End Date Taking? Authorizing Provider  QUEtiapine (SEROQUEL) 100 MG  tablet Take 100 mg by mouth at bedtime.   Yes [provider]  bacitracin ointment Apply 1 application topically 2 (two) times daily. Patient not taking: Reported on 09/22/2015 07/24/15   Antony MaduraHumes, Kelly, PA-C  indomethacin (INDOCIN) 50 MG capsule Take 1 capsule (50 mg total) by mouth 2 (two) times daily with a meal. Patient not taking: Reported on 10/17/2015 09/22/15   Jaynie CrumbleKirichenko, Tatyana, PA-C  naproxen (NAPROSYN) 500 MG tablet Take 1 tablet (500 mg total) by mouth 2 (two) times daily. Patient not taking: Reported on 09/22/2015 07/24/15   Antony MaduraHumes, Kelly, PA-C    Past Medical, Surgical Family and Social History reviewed and updated.    Objective:   Today's Vitals   09/11/16 1324  BP: 112/72  Pulse: 77  Resp: 14  Temp: 98.7 F (37.1 C)  TempSrc: Oral  SpO2: 98%  Weight: 130 lb 3.2 oz (59.1 kg)  Height: 5\' 8"  (1.727 m)    Wt Readings from Last 3 Encounters:  09/11/16 130 lb 3.2 oz (59.1 kg)  10/17/15 140 lb (63.5 kg)  07/24/15 139 lb (63 kg)   Physical Exam  Constitutional: He is oriented to person, place, and time. He appears well-developed and well-nourished.  HENT:  Head: Normocephalic and atraumatic.  Right Ear: External ear normal.  Left Ear: External ear normal.  Nose: Nose normal.  Mouth/Throat: Oropharynx is clear and moist.  Eyes: Pupils are equal, round, and reactive to light. Conjunctivae and EOM are  normal.  Neck: Normal range of motion. Neck supple.  Cardiovascular: Normal rate, regular rhythm, normal heart sounds and intact distal pulses.   Pulmonary/Chest: Effort normal and breath sounds normal.  Abdominal: Soft. Bowel sounds are normal.  Musculoskeletal: Normal range of motion.  Neurological: He is alert and oriented to person, place, and time.  Skin: Skin is warm and dry.  Psychiatric: He has a normal mood and affect. His behavior is normal. Judgment and thought content normal.    Assessment & Plan:  1. Routine screening for STI (sexually transmitted  infection) - GC/Chlamydia Probe Amp - RPR - HSV(herpes simplex vrs) 1+2 ab-IgG - HIV antibody  2. Screening for thyroid disorder - Thyroid Panel With TSH  3. Screening for deficiency anemia - CBC with 3 part Differential  4. Screening for diabetes mellitus - COMPLETE METABOLIC PANEL WITH GFR - Hemoglobin A1c  5. Mood disorder (HCC) -Continue follow-up with [facility] services and current medication regimen.  RTC: 12 months for routine physical exam or sooner if needed.   Godfrey Pick. Tiburcio Pea, MSN, FNP-C [facility]  [address] [place], [place] [address] [phone]

## 2016-09-12 LAB — CBC WITH 3 PART DIFFERENTIAL
BASOS ABS: 0 {cells}/uL (ref 0–200)
Basophils Relative: 0 %
EOS PCT: 2 %
Eosinophils Absolute: 110 cells/uL (ref 15–500)
HCT: 45.2 % (ref 38.5–50.0)
Hemoglobin: 15.1 g/dL (ref 13.2–17.1)
LYMPHS ABS: 1375 {cells}/uL (ref 850–3900)
Lymphocytes Relative: 25 %
MCH: 32.5 pg (ref 27.0–33.0)
MCHC: 33.4 g/dL (ref 32.0–36.0)
MCV: 97.4 fL (ref 80.0–100.0)
MPV: 11.8 fL (ref 7.5–12.5)
Monocytes Absolute: 385 cells/uL (ref 200–950)
Monocytes Relative: 7 %
NEUTROS PCT: 66 %
Neutro Abs: 3630 cells/uL (ref 1500–7800)
Platelets: 149 10*3/uL (ref 140–400)
RBC: 4.64 MIL/uL (ref 4.20–5.80)
RDW: 13.7 % (ref 11.0–15.0)
WBC: 5.5 10*3/uL (ref 3.8–10.8)

## 2016-09-12 LAB — HSV(HERPES SIMPLEX VRS) I + II AB-IGG
HSV 1 GLYCOPROTEIN G AB, IGG: 2.97 {index} — AB (ref <0.90)
HSV 2 Glycoprotein G Ab, IgG: <0.9 Index (ref <0.90)

## 2016-09-12 LAB — COMPLETE METABOLIC PANEL WITH GFR
ALT: 20 U/L (ref 9–46)
AST: 21 U/L (ref 10–40)
Albumin: 4.8 g/dL (ref 3.6–5.1)
Alkaline Phosphatase: 48 U/L (ref 40–115)
BILIRUBIN TOTAL: 0.5 mg/dL (ref 0.2–1.2)
BUN: 10 mg/dL (ref 7–25)
CHLORIDE: 104 mmol/L (ref 98–110)
CO2: 26 mmol/L (ref 20–31)
CREATININE: 0.98 mg/dL (ref 0.60–1.35)
Calcium: 8.9 mg/dL (ref 8.6–10.3)
GFR, Est African American: >89 mL/min (ref >=60)
GFR, Est Non African American: >89 mL/min (ref >=60)
Glucose, Bld: 65 mg/dL (ref 65–99)
Potassium: 4.2 mmol/L (ref 3.5–5.3)
Sodium: 136 mmol/L (ref 135–146)
TOTAL PROTEIN: 6.9 g/dL (ref 6.1–8.1)

## 2016-09-12 LAB — GC/CHLAMYDIA PROBE AMP
CT PROBE, AMP APTIMA: NOT DETECTED
GC Probe RNA: NOT DETECTED

## 2016-09-12 LAB — RPR

## 2016-09-12 LAB — HEMOGLOBIN A1C
Hgb A1c MFr Bld: 4.9 % (ref <5.7)
MEAN PLASMA GLUCOSE: 94 mg/dL

## 2016-09-12 LAB — THYROID PANEL WITH TSH
Free Thyroxine Index: 2.3 (ref 1.4–3.8)
T3 Uptake: 35 % (ref 22–35)
T4, Total: 6.5 ug/dL (ref 4.5–12.0)
TSH: 0.84 m[IU]/L (ref 0.40–4.50)

## 2016-09-12 LAB — HIV ANTIBODY (ROUTINE TESTING W REFLEX): HIV 1&2 Ab, 4th Generation: NONREACTIVE

## 2016-09-15 DIAGNOSIS — F39 Unspecified mood [affective] disorder: Secondary | ICD-10-CM | POA: Insufficient documentation

## 2017-04-02 ENCOUNTER — Emergency Department (HOSPITAL_COMMUNITY)
Admission: EM | Admit: 2017-04-02 | Discharge: 2017-04-03 | Disposition: A | Discharge status: Other Acute Inpatient Hospital | Payer: Medicaid Other | Attending: Emergency Medicine | Admitting: Emergency Medicine

## 2017-04-02 DIAGNOSIS — F329 Major depressive disorder, single episode, unspecified: Secondary | ICD-10-CM | POA: Insufficient documentation

## 2017-04-02 DIAGNOSIS — R45851 Suicidal ideations: Secondary | ICD-10-CM | POA: Insufficient documentation

## 2017-04-02 DIAGNOSIS — F22 Delusional disorders: Secondary | ICD-10-CM | POA: Insufficient documentation

## 2017-04-02 DIAGNOSIS — F17210 Nicotine dependence, cigarettes, uncomplicated: Secondary | ICD-10-CM | POA: Insufficient documentation

## 2017-04-02 DIAGNOSIS — R45850 Homicidal ideations: Secondary | ICD-10-CM | POA: Insufficient documentation

## 2017-04-02 DIAGNOSIS — Z79899 Other long term (current) drug therapy: Secondary | ICD-10-CM | POA: Insufficient documentation

## 2017-04-03 ENCOUNTER — Inpatient Hospital Stay (HOSPITAL_COMMUNITY)
Admission: AD | Admit: 2017-04-03 | Discharge: 2017-04-07 | DRG: 885 | Disposition: A | Discharge status: Home / Self Care | Payer: Medicaid Other | Source: Intra-hospital | Attending: Psychiatry | Admitting: Psychiatry

## 2017-04-03 ENCOUNTER — Other Ambulatory Visit: Payer: Self-pay

## 2017-04-03 ENCOUNTER — Encounter (HOSPITAL_COMMUNITY): Payer: Self-pay

## 2017-04-03 DIAGNOSIS — Z598 Other problems related to housing and economic circumstances: Secondary | ICD-10-CM

## 2017-04-03 DIAGNOSIS — F332 Major depressive disorder, recurrent severe without psychotic features: Secondary | ICD-10-CM | POA: Diagnosis not present

## 2017-04-03 DIAGNOSIS — R45851 Suicidal ideations: Secondary | ICD-10-CM | POA: Diagnosis present

## 2017-04-03 DIAGNOSIS — G4700 Insomnia, unspecified: Secondary | ICD-10-CM | POA: Diagnosis present

## 2017-04-03 DIAGNOSIS — Z915 Personal history of self-harm: Secondary | ICD-10-CM | POA: Diagnosis not present

## 2017-04-03 DIAGNOSIS — F1011 Alcohol abuse, in remission: Secondary | ICD-10-CM | POA: Diagnosis present

## 2017-04-03 DIAGNOSIS — F1290 Cannabis use, unspecified, uncomplicated: Secondary | ICD-10-CM | POA: Diagnosis not present

## 2017-04-03 DIAGNOSIS — F1210 Cannabis abuse, uncomplicated: Secondary | ICD-10-CM | POA: Diagnosis present

## 2017-04-03 DIAGNOSIS — F22 Delusional disorders: Secondary | ICD-10-CM | POA: Diagnosis present

## 2017-04-03 DIAGNOSIS — R450 Nervousness: Secondary | ICD-10-CM | POA: Diagnosis not present

## 2017-04-03 DIAGNOSIS — Z630 Problems in relationship with spouse or partner: Secondary | ICD-10-CM

## 2017-04-03 DIAGNOSIS — F17210 Nicotine dependence, cigarettes, uncomplicated: Secondary | ICD-10-CM | POA: Diagnosis present

## 2017-04-03 DIAGNOSIS — Z9114 Patient's other noncompliance with medication regimen: Secondary | ICD-10-CM | POA: Diagnosis not present

## 2017-04-03 DIAGNOSIS — F419 Anxiety disorder, unspecified: Secondary | ICD-10-CM | POA: Diagnosis present

## 2017-04-03 LAB — ACETAMINOPHEN LEVEL: Acetaminophen (Tylenol), Serum: <10 ug/mL — ABNORMAL LOW (ref 10–30)

## 2017-04-03 LAB — COMPREHENSIVE METABOLIC PANEL
ALT: 21 U/L (ref 17–63)
AST: 32 U/L (ref 15–41)
Albumin: 5.2 g/dL — ABNORMAL HIGH (ref 3.5–5.0)
Alkaline Phosphatase: 64 U/L (ref 38–126)
Anion gap: 8 (ref 5–15)
BUN: 13 mg/dL (ref 6–20)
CO2: 27 mmol/L (ref 22–32)
Calcium: 9.4 mg/dL (ref 8.9–10.3)
Chloride: 101 mmol/L (ref 101–111)
Creatinine, Ser: 0.89 mg/dL (ref 0.61–1.24)
GFR calc Af Amer: >60 mL/min (ref >60)
GFR calc non Af Amer: >60 mL/min (ref >60)
Glucose, Bld: 80 mg/dL (ref 65–99)
Potassium: 3.7 mmol/L (ref 3.5–5.1)
Sodium: 136 mmol/L (ref 135–145)
Total Bilirubin: 0.6 mg/dL (ref 0.3–1.2)
Total Protein: 8 g/dL (ref 6.5–8.1)

## 2017-04-03 LAB — CBC
HCT: 46.8 % (ref 39.0–52.0)
Hemoglobin: 16.8 g/dL (ref 13.0–17.0)
MCH: 33.9 pg (ref 26.0–34.0)
MCHC: 35.9 g/dL (ref 30.0–36.0)
MCV: 94.4 fL (ref 78.0–100.0)
Platelets: 161 10*3/uL (ref 150–400)
RBC: 4.96 MIL/uL (ref 4.22–5.81)
RDW: 11.7 % (ref 11.5–15.5)
WBC: 11.4 10*3/uL — ABNORMAL HIGH (ref 4.0–10.5)

## 2017-04-03 LAB — RAPID URINE DRUG SCREEN, HOSP PERFORMED
Amphetamines: NOT DETECTED
Barbiturates: NOT DETECTED
Benzodiazepines: NOT DETECTED
Cocaine: NOT DETECTED
Opiates: NOT DETECTED
Tetrahydrocannabinol: NOT DETECTED

## 2017-04-03 LAB — SALICYLATE LEVEL: Salicylate Lvl: <7 mg/dL (ref 2.8–30.0)

## 2017-04-03 LAB — ETHANOL

## 2017-04-03 MED ORDER — ALUM & MAG HYDROXIDE-SIMETH 200-200-20 MG/5ML PO SUSP: 30.0000 mL | ORAL | Status: DC | PRN

## 2017-04-03 MED ORDER — TRAZODONE HCL 50 MG PO TABS
50.0000 mg | ORAL_TABLET | Freq: Every evening | ORAL | Status: DC | PRN
  Filled 2017-04-03: qty 1

## 2017-04-03 MED ORDER — HYDROXYZINE HCL 25 MG PO TABS
25.0000 mg | ORAL_TABLET | Freq: Three times a day (TID) | ORAL | Status: DC | PRN
  Administered 2017-04-03: 25 mg via ORAL
  Filled 2017-04-03: qty 1

## 2017-04-03 MED ORDER — QUETIAPINE FUMARATE 100 MG PO TABS
100.0000 mg | ORAL_TABLET | Freq: Every day | ORAL | Status: DC
  Administered 2017-04-03 – 2017-04-06 (×4): 100 mg via ORAL
  Filled 2017-04-03 (×7): qty 1

## 2017-04-03 MED ORDER — NICOTINE 21 MG/24HR TD PT24: 21.0000 mg | MEDICATED_PATCH | Freq: Every day | TRANSDERMAL | Status: DC

## 2017-04-03 MED ORDER — NICOTINE POLACRILEX 2 MG MT GUM
2.0000 mg | CHEWING_GUM | OROMUCOSAL | Status: DC | PRN
  Administered 2017-04-05 (×2): 2 mg via ORAL
  Filled 2017-04-03: qty 1

## 2017-04-03 MED ORDER — ACETAMINOPHEN 325 MG PO TABS: 650.0000 mg | ORAL_TABLET | Freq: Four times a day (QID) | ORAL | Status: DC | PRN

## 2017-04-03 MED ORDER — MAGNESIUM HYDROXIDE 400 MG/5ML PO SUSP: 30.0000 mL | Freq: Every day | ORAL | Status: DC | PRN

## 2017-04-03 MED ORDER — NICOTINE 21 MG/24HR TD PT24
21.0000 mg | MEDICATED_PATCH | Freq: Every day | TRANSDERMAL | Status: DC
  Filled 2017-04-03: qty 1

## 2017-04-03 NOTE — Progress Notes (Signed)
Chris Olson is a [age] year oldVerneita Olson male being admitted involuntarily to 302-1 from [facility].  He came in under IVC taken out by his wife for suicidal ideation and plan to jump off a bridge.  [facility] he denied SI/HI or A/V hallucinations.  He just stated that he has been having relationship issues and not on any psychiatric medications.  He did report quitting drinking two months ago.  He continues to report feeling depressed and worthless.  He denies any pain or discomfort and appears to be in no physical distress.  Oriented him to the unit.  Admission paperwork completed and signed.  Belongings searched and secured in locker # 23, no contraband found.  Skin assessment completed and no skin issues noted.  Q 15 minute checks initiated for safety.  We will continue to monitor the progress towards his goals.

## 2017-04-03 NOTE — BH Assessment (Addendum)
[facility]  Per Juanetta BeetsJacqueline Norman, DO, this pt requires psychiatric hospitalization at this time.  Malva LimesLinsey Strader, RN, [facility]; Linsey will call [facility].  Pt presents under IVC initiated by pt's wife, and upheld by Dr Sharma CovertNorman, and [facility]  Pt's nurse, Carlisle BeersLuann, has been notified, and agrees to call report to [phone].  Pt is to be transported via [employer] when the time comes.   Doylene Canninghomas Lechelle Wrigley, [place] Behavioral Health Coordinator [phone]   Addendum:  Richelle ItoLinsey calls to request that [facility] at [facility].  Morrie Sheldonshley has been notified.  Doylene Canninghomas Ennifer Harston, [place] Behavioral Health Coordinator [phone]

## 2017-04-03 NOTE — ED Notes (Signed)
[employer] called for transport 

## 2017-04-03 NOTE — ED Provider Notes (Signed)
[facility]-EMERGENCY DEPT Provider Note   CSN: [id number] Arrival date & time: 04/02/17  2327     History   Chief Complaint Chief Complaint  Patient presents with  . Suicidal  . Homicidal    HPI Chris Olson is a 27 y.o. male.  Patient here for evaluation of suicidal feelings, paranoia described as the feeling that people are laughing at him causing him to avoid public places. He is having relationship difficulty and feels he is always wrong and this makes him depressed.    The history is provided by the patient. No language interpreter was used.    Past Medical History:  Diagnosis Date  . Chest pain   . Pericarditis     Patient Active Problem List   Diagnosis Date Noted  . Mood disorder (HCC) 09/15/2016    History reviewed. No pertinent surgical history.     Home Medications    Prior to Admission medications   Medication Sig Start Date End Date Taking? Authorizing Provider  bacitracin ointment Apply 1 application topically 2 (two) times daily. Patient not taking: Reported on 09/22/2015 07/24/15   Antony Madura, PA-C  indomethacin (INDOCIN) 50 MG capsule Take 1 capsule (50 mg total) by mouth 2 (two) times daily with a meal. Patient not taking: Reported on 10/17/2015 09/22/15   Jaynie Crumble, PA-C  naproxen (NAPROSYN) 500 MG tablet Take 1 tablet (500 mg total) by mouth 2 (two) times daily. Patient not taking: Reported on 09/22/2015 07/24/15   Antony Madura, PA-C  QUEtiapine (SEROQUEL) 100 MG tablet Take 100 mg by mouth at bedtime.    [provider]    Family History History reviewed. No pertinent family history.  Social History Social History   Tobacco Use  . Smoking status: Current Every Day Smoker    Types: Cigarettes  . Smokeless tobacco: Never Used  Substance Use Topics  . Alcohol use: Yes  . Drug use: Yes    Types: Marijuana     Allergies   Patient has no known allergies.   Review of Systems Review of  Systems  Constitutional: Negative for chills and fever.  HENT: Negative.   Respiratory: Negative.   Cardiovascular: Negative.   Gastrointestinal: Negative.   Musculoskeletal: Negative.   Skin: Negative.   Neurological: Negative.   Psychiatric/Behavioral: Positive for dysphoric mood and suicidal ideas.       Paranoia     Physical Exam Updated Vital Signs BP (!) 134/92   Pulse 93   Temp 98.4 F (36.9 C) (Oral)   Resp 14   SpO2 100%   Physical Exam  Constitutional: He is oriented to person, place, and time. He appears well-developed and well-nourished.  Neck: Normal range of motion.  Pulmonary/Chest: Effort normal.  Musculoskeletal: Normal range of motion.  Neurological: He is alert and oriented to person, place, and time.  Skin: Skin is warm and dry.  Psychiatric: His speech is delayed. He is not actively hallucinating. Thought content is paranoid. He exhibits a depressed mood. He expresses suicidal ideation.     ED Treatments / Results  Labs (all labs ordered are listed, but only abnormal results are displayed) Labs Reviewed  COMPREHENSIVE METABOLIC PANEL - Abnormal; Notable for the following components:      Result Value   Albumin 5.2 (*)    All other components within normal limits  ACETAMINOPHEN LEVEL - Abnormal; Notable for the following components:   Acetaminophen (Tylenol), Serum <10 (*)    All other components within normal  limits  CBC - Abnormal; Notable for the following components:   WBC 11.4 (*)    All other components within normal limits  ETHANOL  SALICYLATE LEVEL  RAPID URINE DRUG SCREEN, HOSP PERFORMED    EKG  EKG Interpretation None       Radiology No results found.  Procedures Procedures (including critical care time)  Medications Ordered in ED Medications  nicotine (NICODERM CQ - dosed in mg/24 hours) patch 21 mg (not administered)     Initial Impression / Assessment and Plan / ED Course  I have reviewed the triage vital signs  and the nursing notes.  Pertinent labs & imaging results that were available during my care of the patient were reviewed by me and considered in my medical decision making (see chart for details).     Patient presents for help with depression and passive SI, HI. He describes paranoia interfering with usual activity. TTS consultation requested to determine disposition.   Final Clinical Impressions(s) / ED Diagnoses   Final diagnoses:  None   1. S 2. Depression 3. Paranoia  ED Discharge Orders    None       Elpidio AnisUpstill, Corrion Stirewalt, PA-C 04/03/17 [id number]    Derwood KaplanNanavati, Ankit, MD 04/04/17 [phone]

## 2017-04-03 NOTE — ED Notes (Signed)
Patient admitted on unit. Endorses depression of 5/10. Patient stated that he's been depressed for almost his lifetime. Denies pain, SI/HI, AH/VH.  Will continue to monitor patient.

## 2017-04-03 NOTE — Tx Team (Signed)
Initial Treatment Plan 04/03/2017 10:23 PM Chris Olson ZOX:[id number]    PATIENT STRESSORS: Financial difficulties Marital or family conflict   PATIENT STRENGTHS: General fund of knowledge Motivation for treatment/growth Physical Health   PATIENT IDENTIFIED PROBLEMS: Suicidal ideation  Depression  "I don't want to be sad and depressed anymore"  "Get better to be there for my kids"  "I want my family to be proud of me and see a better me"             DISCHARGE CRITERIA:  Improved stabilization in mood, thinking, and/or behavior Verbal commitment to aftercare and medication compliance  PRELIMINARY DISCHARGE PLAN: Outpatient therapy Medication management  PATIENT/FAMILY INVOLVEMENT: This treatment plan has been presented to and reviewed with the patient, Chris Olson.  The patient and family have been given the opportunity to ask questions and make suggestions.  Levin BaconHeather V Romanita Fager, RN 04/03/2017, 10:23 PM

## 2017-04-03 NOTE — ED Notes (Signed)
Pt dc to police transport

## 2017-04-03 NOTE — ED Triage Notes (Addendum)
Pt reports that he is going through a lot and having thoughts about hurting himself and others. He states that he is always feeling depressed, paranoid, and stressed. He is currently going through a divorce. He states that he feels like he is lost and cant do anything right. He feels like no one is giving him a chance. He wants a long term treatment. He did mention things to live for (his children). A&Ox4.

## 2017-04-03 NOTE — Progress Notes (Signed)
Report received from admitting RN.  Introduced self to pt.  Pt presents anxious and fidgety.  Support and encouragement provided.  He denies SI/HI, hallucinations, pain, and withdrawal symptoms.  He reports he has not consumed alcohol since 2 months ago.  Pt verbally contracts for safety.  PRN medication administered for anxiety.  Medication education provided.  Pt is on Q15 minute safety checks.  Will continue to monitor and assess.
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
## 2017-04-03 NOTE — ED Notes (Signed)
Wanded by security 

## 2017-04-04 ENCOUNTER — Encounter (HOSPITAL_COMMUNITY): Payer: Self-pay | Admitting: Registered Nurse

## 2017-04-04 DIAGNOSIS — F22 Delusional disorders: Secondary | ICD-10-CM | POA: Diagnosis present

## 2017-04-04 DIAGNOSIS — R450 Nervousness: Secondary | ICD-10-CM

## 2017-04-04 DIAGNOSIS — F419 Anxiety disorder, unspecified: Secondary | ICD-10-CM

## 2017-04-04 DIAGNOSIS — F332 Major depressive disorder, recurrent severe without psychotic features: Principal | ICD-10-CM

## 2017-04-04 DIAGNOSIS — R45851 Suicidal ideations: Secondary | ICD-10-CM

## 2017-04-04 MED ORDER — ARIPIPRAZOLE 2 MG PO TABS
2.0000 mg | ORAL_TABLET | Freq: Every day | ORAL | Status: DC
  Filled 2017-04-04 (×4): qty 1

## 2017-04-04 MED ORDER — BENZTROPINE MESYLATE 0.5 MG PO TABS
0.5000 mg | ORAL_TABLET | Freq: Every day | ORAL | Status: DC
  Filled 2017-04-04 (×3): qty 1

## 2017-04-04 MED ORDER — ARIPIPRAZOLE 2 MG PO TABS
2.0000 mg | ORAL_TABLET | Freq: Every day | ORAL | Status: DC
  Administered 2017-04-04: 2 mg via ORAL
  Filled 2017-04-04 (×2): qty 1

## 2017-04-04 MED ORDER — SERTRALINE HCL 50 MG PO TABS
50.0000 mg | ORAL_TABLET | Freq: Every day | ORAL | Status: DC
  Filled 2017-04-04 (×2): qty 1

## 2017-04-04 MED ORDER — BENZTROPINE MESYLATE 0.5 MG PO TABS
0.5000 mg | ORAL_TABLET | Freq: Every day | ORAL | Status: DC
  Administered 2017-04-04: 0.5 mg via ORAL
  Filled 2017-04-04 (×2): qty 1

## 2017-04-04 NOTE — [facility] Counselor (Signed)
Adult Comprehensive Assessment  Patient ID: Chris Olson, male   DOB: 06/02/1990, 27 y.o.   MRN: [id number]  Information Source: Information source: Patient  Current Stressors:  Educational / Learning stressors: graduate high school Employment / Job issues: "i've never been able to hold down a job." Family Relationships: fair relationship with father; wife is asking for divorce. close to his 2 kids; "I haven't spoken to my mother in months." Financial / Lack of resources (include bankruptcy): [place] and Disability income Housing / Lack of housing: living with wife temporarily. will likely move in with father Physical health (include injuries & life threatening diseases): none identified Social relationships: poor social support network Substance abuse: "I haven't drank or used any drugs in two months." prior to this, pt reports intermittent alcohol, cocaine, and marijuana use Bereavement / Loss: loss of marriage. "I'm having trouble accepting that my wife wants a divorce when I've been trying so hard to make things right."   Living/Environment/Situation:  Living Arrangements: Spouse/significant other, Children Living conditions (as described by patient or guardian): lives with wife temporarily. "I try to see my kids everyday and they stay with me sometimes."  How long has patient lived in current situation?: five years on and off  What is atmosphere in current home: Temporary, Chaotic  Family History:  Marital status: Separated Separated, when?: few days ago What types of issues is patient dealing with in the relationship?: Pt reports that his wife wants a divorce. "I don't know what the problem is but I feel like it's me." Additional relationship information: n/a  Are you sexually active?: Yes What is your sexual orientation?: heterosexual Has your sexual activity been affected by drugs, alcohol, medication, or emotional stress?: n/a  Does patient have children?:  Yes How many children?: 2 How is patient's relationship with their children?: 8yo boy and 6yo girl. "I see them everyday. We have a good relationship."   Childhood History:  By whom was/is the patient raised?: Both parents Additional childhood history information: Mom and dad were married until patient turned [phone]. "My childhood was okay. My parents were controlling."  Description of patient's relationship with caregiver when they were a child: close to mother; close to father Patient's description of current relationship with people who raised him/her: no relationship with mother currently; strained/fair relationship with father. "He tries to help me out but I really don't feel like I can talk to him."  How were you disciplined when you got in trouble as a child/adolescent?: n/a  Does patient have siblings?: Yes Number of Siblings: 2 Description of patient's current relationship with siblings: one brother and one sister. "My brother is very depressed." close to both siblings.  Did patient suffer any verbal/emotional/physical/sexual abuse as a child?: No Did patient suffer from severe childhood neglect?: No Has patient ever been sexually abused/assaulted/raped as an adolescent or adult?: No Was the patient ever a victim of a crime or a disaster?: No  Education:  Highest grade of school patient has completed: hs diploma Currently a student?: No Name of school: NA Learning disability?: No  Employment/Work Situation:   Employment situation: On disability Why is patient on disability: schizophrenia/mental illness How long has patient been on disability: few years Patient's job has been impacted by current illness: Yes Describe how patient's job has been impacted: unable to [employer] employment due to "thoughts getting jumpled in my head and agitation."  What is the longest time patient has a held a job?: 2 months in high school  Where was the patient employed at that time?: [employer]  Has  patient ever been in the [employer]?: No Has patient ever served in combat?: No Did You Receive Any Psychiatric Treatment/Services While in Equities trader?: No Are There Guns or Other Weapons in Your Home?: No Are These Weapons Safely Secured?: (n/a)  Financial Resources:   Financial resources: Insurance claims handler, Medicaid, Support from parents / caregiver Does patient have a Lawyer or guardian?: No  Alcohol/Substance Abuse:   What has been your use of drugs/alcohol within the last 12 months?: Pt reports that he has been sober for 2 months although in assessment he reports drinking recently. pt reports history of intermittent alcohol, marijuana, and occassional cocaine use.  If attempted suicide, did drugs/alcohol play a role in this?: No(pt reports that he was sober when he felt suicidal) Alcohol/Substance Abuse Treatment Hx: Denies past history, Past Tx, Outpatient If yes, describe treatment: [facility] history but has not gone to appt or taken psychiatric medications in over a month.  Has alcohol/substance abuse ever caused legal problems?: No  Social Support System:   Forensic psychologist System: Poor Describe Community Support System: pt denies positive social supports Type of faith/religion: christian How does patient's faith help to cope with current illness?: prayer; occassional church  Leisure/Recreation:   Leisure and Hobbies: spending time with my kids.   Strengths/Needs:   What things does the patient do well?: "I don't know." In what areas does patient struggle / problems for patient: coping with my wife wanting a divorce; lonliness; medication compliance; building a support network  Discharge Plan:   Does patient have access to transportation?: Yes(bus) Will patient be returning to same living situation after discharge?: Yes(may stay with wife or father until "my tax money comes in and I can get an apt." ) Currently receiving community mental health services:  No If no, would patient like referral for services when discharged?: Yes (What county?)([place]) Does patient have financial barriers related to discharge medications?: No  Summary/Recommendations:   Summary and Recommendations (to be completed by the evaluator): Patient is 26yo male living in [place], [place] [facility] county) with his wife. He presents involuntarily to the hospital due to [facility] with plan, increased mood lability/depression, (history of recent suicide attempts), and for medication stabilization. Patient reports that he is on disbility, is currently married with 2 chidlren, and has a diagnosis of Schizophrenia. Patient reports that he has not used drugs or alcohol in about 2 months with a history of intermittent alcohol, cocaine, and marijuana use. He denies SI/HI/AVH currently and states that he has been off psychiatric medication for about 1 month. Recommendations for patient include: crisis stabilization, therapeutic milieu, encourage group attendance and participation, medication management for mood stabilization, and development of comprehensive mental wellness plan. CSW assessing for appropriate referrals.   Ledell Peoples Smart LCSW 04/04/2017 10:40 AM

## 2017-04-04 NOTE — H&P (Signed)
Psychiatric Admission Assessment Adult  Patient Identification: Chris Olson MRN:  [id number] Date of Evaluation:  04/04/2017 Chief Complaint:  MDD RECURRENT WITH PSYCHOTIC FEATURES ALCOHOL USE DISORDER Principal Diagnosis: MDD (major depressive disorder), recurrent severe, without psychosis ([place]) Diagnosis:   Patient Active Problem List   Diagnosis Date Noted  . Paranoia ([place]) [F22] 04/04/2017  . MDD (major depressive disorder), recurrent severe, without psychosis ([place]) [F33.2] 04/03/2017  . Mood disorder ([place]) [F39] 09/15/2016   History of Present Illness: Chris Olson, 27 y.o., male admitted to [facility] after presenting to ED under IVC by his wife; with complaints that after an argument with his wife he told her that he was going to kill himself by jumping off bridge.  Patient also had complaints of paranoia stating he felt that people were watching and laughing at him.    Patient seen face to face by this provider; chart reviewed and consulted with Dr. Parke Poisson and treatment team on 04/04/17.  On evaluation Chris Olson reports that he is in the hospital because he was having depression related to him and his wife going through a divorce.  Patient states "I try to respect my wife and what she wants but get depressed when things don't go my way.  She acts like she doesn't even want me to hug her.  I just need to get my life together and get more independent so I can take care of my self and my kids."  Patient states that he has not had suicidal thoughts since admission but still feels worthless, hopeless, and useless.  Patient states that he has prior history psychiatric inpatient treatment and has taken psychotropic in past.  At this time patient denies homicidal ideation and psychosis; but states that he does have paranoia feel that everyone is talking about him and laughing at him.  States that he does have a violent history but no violence in the last 10 yrs.  patient states that he is  currently living with his wife but waiting on taxes to come back so that he can get his own place.  Unemployed but gets disability related to his psychiatric illness.      Associated Signs/Symptoms: Depression Symptoms:  depressed mood, insomnia, feelings of worthlessness/guilt, hopelessness, suicidal thoughts with specific plan, anxiety, (Hypo) Manic Symptoms:  Impulsivity, Irritable Mood, Anxiety Symptoms:  Denies Psychotic Symptoms:  Paranoia, PTSD Symptoms: Denies Total Time spent with patient: 45 minutes  Past Psychiatric History: MDD with psychiatric features; Anxiety.  Inpatient and outpatient services.  Has been on Zoloft and Seroquel in past but doesn't feel that the Zoloft really worked.  Prior outpatient services with [facility]  Is the patient at risk to self? Yes.    Has the patient been a risk to self in the past 6 months? No.  Has the patient been a risk to self within the distant past? Yes.    Is the patient a risk to others? No.  Has the patient been a risk to others in the past 6 months? No.  Has the patient been a risk to others within the distant past? No.   Prior Inpatient Therapy:   Prior Outpatient Therapy:    Alcohol Screening: 1. How often do you have a drink containing alcohol?: 2 to 4 times a month 2. How many drinks containing alcohol do you have on a typical day when you are drinking?: 3 or 4 3. How often do you have six or more drinks on one occasion?: Less than monthly  AUDIT-C Score: 4 4. How often during the last year have you found that you were not able to stop drinking once you had started?: Never 5. How often during the last year have you failed to do what was normally expected from you becasue of drinking?: Never 6. How often during the last year have you needed a first drink in the morning to get yourself going after a heavy drinking session?: Never 7. How often during the last year have you had a feeling of guilt of remorse after drinking?:  Never 8. How often during the last year have you been unable to remember what happened the night before because you had been drinking?: Never 9. Have you or someone else been injured as a result of your drinking?: No 10. Has a relative or friend or a doctor or another health worker been concerned about your drinking or suggested you cut down?: Yes, but not in the last year Alcohol Use Disorder Identification Test Final Score (AUDIT): 6 Intervention/Follow-up: AUDIT Score <7 follow-up not indicated Substance Abuse History in the last 12 months:  Yes.   Consequences of Substance Abuse: Legal Consequences:  Arrest No substance use in last couple months Previous Psychotropic Medications: Yes  Psychological Evaluations: No  Past Medical History:  Past Medical History:  Diagnosis Date  . Chest pain   . Pericarditis    History reviewed. No pertinent surgical history. Family History: History reviewed. No pertinent family history. Family Psychiatric  History: Unaware Tobacco Screening: Have you used any form of tobacco in the last 30 days? (Cigarettes, Smokeless Tobacco, Cigars, and/or Pipes): Yes Tobacco use, Select all that apply: 5 or more cigarettes per day Are you interested in Tobacco Cessation Medications?: Yes, will notify MD for an order Counseled patient on smoking cessation including recognizing danger situations, developing coping skills and basic information about quitting provided: Refused/Declined practical counseling Social History:  Social History   Substance and Sexual Activity  Alcohol Use Yes     Social History   Substance and Sexual Activity  Drug Use Yes  . Types: Marijuana    Additional Social History: Marital status: Separated Separated, when?: few days ago What types of issues is patient dealing with in the relationship?: Pt reports that his wife wants a divorce. "I don't know what the problem is but I feel like it's me." Additional relationship information:  n/a  Are you sexually active?: Yes What is your sexual orientation?: heterosexual Has your sexual activity been affected by drugs, alcohol, medication, or emotional stress?: n/a  Does patient have children?: Yes How many children?: 2 How is patient's relationship with their children?: 35yo boy and 6yo girl. "I see them everyday. We have a good relationship."                          Allergies:  No Known Allergies Lab Results:  Results for orders placed or performed during the hospital encounter of 04/02/17 (from the past 48 hour(s))  Rapid urine drug screen (hospital performed)     Status: None   Collection Time: 04/03/17 12:12 AM  Result Value Ref Range   Opiates NONE DETECTED NONE DETECTED   Cocaine NONE DETECTED NONE DETECTED   Benzodiazepines NONE DETECTED NONE DETECTED   Amphetamines NONE DETECTED NONE DETECTED   Tetrahydrocannabinol NONE DETECTED NONE DETECTED   Barbiturates NONE DETECTED NONE DETECTED    Comment: (NOTE) DRUG SCREEN FOR MEDICAL PURPOSES ONLY.  IF CONFIRMATION IS NEEDED FOR  ANY PURPOSE, NOTIFY LAB WITHIN 5 DAYS. LOWEST DETECTABLE LIMITS FOR URINE DRUG SCREEN Drug Class                     Cutoff (ng/mL) Amphetamine and metabolites    1000 Barbiturate and metabolites    200 Benzodiazepine                 092 Tricyclics and metabolites     300 Opiates and metabolites        300 Cocaine and metabolites        300 THC                            50 Performed at [facility], [place] [address], [place], [place] [address]   Comprehensive metabolic panel     Status: Abnormal   Collection Time: 04/03/17 12:29 AM  Result Value Ref Range   Sodium 136 135 - 145 mmol/L   Potassium 3.7 3.5 - 5.1 mmol/L   Chloride 101 101 - 111 mmol/L   CO2 27 22 - 32 mmol/L   Glucose, Bld 80 65 - 99 mg/dL   BUN 13 6 - 20 mg/dL   Creatinine, Ser 0.89 0.61 - 1.24 mg/dL   Calcium 9.4 8.9 - 10.3 mg/dL   Total Protein 8.0 6.5 - 8.1 g/dL   Albumin 5.2 (H)  3.5 - 5.0 g/dL   AST 32 15 - 41 U/L   ALT 21 17 - 63 U/L   Alkaline Phosphatase 64 38 - 126 U/L   Total Bilirubin 0.6 0.3 - 1.2 mg/dL   GFR calc non Af Amer >60 >60 mL/min   GFR calc Af Amer >60 >60 mL/min    Comment: (NOTE) The eGFR has been calculated using the CKD EPI equation. This calculation has not been validated in all clinical situations. eGFR's persistently <60 mL/min signify possible Chronic Kidney Disease.    Anion gap 8 5 - 15    Comment: Performed at [facility], [place] [address], [place], [place] [address]  Ethanol     Status: None   Collection Time: 04/03/17 12:29 AM  Result Value Ref Range   Alcohol, Ethyl (B) <10 <10 mg/dL    Comment:        LOWEST DETECTABLE LIMIT FOR SERUM ALCOHOL IS 10 mg/dL FOR MEDICAL PURPOSES ONLY Performed at [facility], [place] [address], [place], [place] [address]   Salicylate level     Status: None   Collection Time: 04/03/17 12:29 AM  Result Value Ref Range   Salicylate Lvl <6.2 2.8 - 30.0 mg/dL    Comment: Performed at [facility], [place] [address] [place], [place] [address]  Acetaminophen level     Status: Abnormal   Collection Time: 04/03/17 12:29 AM  Result Value Ref Range   Acetaminophen (Tylenol), Serum <10 (L) 10 - 30 ug/mL    Comment:        THERAPEUTIC CONCENTRATIONS VARY SIGNIFICANTLY. A RANGE OF 10-30 ug/mL MAY BE AN EFFECTIVE CONCENTRATION FOR MANY PATIENTS. HOWEVER, SOME ARE BEST TREATED AT CONCENTRATIONS OUTSIDE THIS RANGE. ACETAMINOPHEN CONCENTRATIONS >150 ug/mL AT 4 HOURS AFTER INGESTION AND >50 ug/mL AT 12 HOURS AFTER INGESTION ARE OFTEN ASSOCIATED WITH TOXIC REACTIONS. Performed at [facility] [place] [address], [place], [place] [address]   cbc     Status: Abnormal   Collection Time: 04/03/17 12:29 AM  Result  Value Ref Range   WBC 11.4 (H) 4.0 - 10.5 K/uL   RBC 4.96 4.22 - 5.81 MIL/uL   Hemoglobin 16.8 13.0 - 17.0 g/dL   HCT  46.8 39.0 - 52.0 %   MCV 94.4 78.0 - 100.0 fL   MCH 33.9 26.0 - 34.0 pg   MCHC 35.9 30.0 - 36.0 g/dL   RDW 11.7 11.5 - 15.5 %   Platelets 161 150 - 400 K/uL    Comment: Performed at [facility], [place] [address] [place], [place] [address]    Blood Alcohol level:  Lab Results  Component Value Date   ETH <10 42/68/3419    Metabolic Disorder Labs:  Lab Results  Component Value Date   HGBA1C 4.9 09/11/2016   MPG 94 09/11/2016   No results found for: PROLACTIN No results found for: CHOL, TRIG, HDL, CHOLHDL, VLDL, LDLCALC  Current Medications: Current Facility-Administered Medications  Medication Dose Route Frequency Provider Last Rate Last Dose  . acetaminophen (TYLENOL) tablet 650 mg  650 mg Oral Q6H PRN Ethelene Hal, NP      . alum & mag hydroxide-simeth (MAALOX/MYLANTA) 200-200-20 MG/5ML suspension 30 mL  30 mL Oral Q4H PRN Ethelene Hal, NP      . hydrOXYzine (ATARAX/VISTARIL) tablet 25 mg  25 mg Oral TID PRN Ethelene Hal, NP   25 mg at 04/03/17 2044  . magnesium hydroxide (MILK OF MAGNESIA) suspension 30 mL  30 mL Oral Daily PRN Ethelene Hal, NP      . nicotine polacrilex (NICORETTE) gum 2 mg  2 mg Oral PRN Izediuno, Laruth Bouchard, MD      . QUEtiapine (SEROQUEL) tablet 100 mg  100 mg Oral QHS Patriciaann Clan E, PA-C   100 mg at 04/03/17 2150  . sertraline (ZOLOFT) tablet 50 mg  50 mg Oral Daily Demyan Fugate, Myer Peer, MD       PTA Medications: Medications Prior to Admission  Medication Sig Dispense Refill Last Dose  . bacitracin ointment Apply 1 application topically 2 (two) times daily. (Patient not taking: Reported on 09/22/2015) 15 g 0 Not Taking  . indomethacin (INDOCIN) 50 MG capsule Take 1 capsule (50 mg total) by mouth 2 (two) times daily with a meal. (Patient not taking: Reported on 10/17/2015) 20 capsule 0 Not Taking  . naproxen (NAPROSYN) 500 MG tablet Take 1 tablet (500 mg total) by mouth 2 (two) times daily. (Patient not  taking: Reported on 09/22/2015) 30 tablet 0 Not Taking    Musculoskeletal: Strength & Muscle Tone: within normal limits Gait & Station: normal Patient leans: N/A  Psychiatric Specialty Exam: Physical Exam  Vitals reviewed. Constitutional: He is oriented to person, place, and time. He appears well-developed and well-nourished.  Neck: Normal range of motion. Neck supple.  Respiratory: Effort normal.  Musculoskeletal: Normal range of motion.  Neurological: He is alert and oriented to person, place, and time.  Skin: Skin is warm and dry.    Review of Systems  Psychiatric/Behavioral: Positive for depression and suicidal ideas. Negative for hallucinations, memory loss and substance abuse (History of substance use but has been clean for several months). The patient is nervous/anxious.   All other systems reviewed and are negative.   Blood pressure 108/79, pulse 87, temperature (!) 97.5 F (36.4 C), resp. rate 18, height _0  (1.727 m), weight 62.1 kg (137 lb).Body mass index is 20.83 kg/m.  General Appearance: Casual  Eye Contact:  Good  Speech:  Clear and Coherent and  Normal Rate  Volume:  Normal  Mood:  Depressed and Hopeless  Affect:  Congruent and Depressed  Thought Process:  Goal Directed  Orientation:  Full (Time, Place, and Person)  Thought Content:  Paranoid Ideation  Suicidal Thoughts:  Denies suicidal ideation at this current time; but states that he had a plan to jump off of bridge.  Homicidal Thoughts:  No  Memory:  Immediate;   Good Recent;   Good Remote;   Good  Judgement:  Fair  Insight:  Fair  Psychomotor Activity:  Normal  Concentration:  Concentration: Good and Attention Span: Good  Recall:  Good  Fund of Knowledge:  Fair  Language:  Good  Akathisia:  No  Handed:  Right  AIMS (if indicated):     Assets:  Communication Skills Desire for Improvement Housing Social Support  ADL's:  Intact  Cognition:  WNL  Sleep:  Number of Hours: 6.75    Treatment  Plan Summary: Daily contact with patient to assess and evaluate symptoms and progress in treatment and Medication management  Observation Level/Precautions:  15 minute checks  Laboratory:  CBC Chemistry Profile UDS UA  Psychotherapy:   Individual and group sessions  Medications:  Started Abilify 2 mg daily for MDD/paranoia//mood stabilization; may titrate up to 5 mg if no adverse reaction; Seroquel 100 mg mood stabilization/Insomnia, Vistaril 25 mg Tid prn Anxiety; Cogentin 0.5 mg daily EPS  Consultations:  As needed  Discharge Concerns:  Safety, stabilization, and access to medication  Estimated LOS:  5-7 days  Other:     Physician Treatment Plan for Primary Diagnosis: MDD (major depressive disorder), recurrent severe, without psychosis ([place]) Long Term Goal(s): Improvement in symptoms so as ready for discharge  Short Term Goals: Ability to identify changes in lifestyle to reduce recurrence of condition will improve, Ability to verbalize feelings will improve, Ability to disclose and discuss suicidal ideas, Ability to demonstrate self-control will improve, Ability to identify and develop effective coping behaviors will improve and Ability to identify triggers associated with substance abuse/mental health issues will improve  Physician Treatment Plan for Secondary Diagnosis: Principal Problem:   MDD (major depressive disorder), recurrent severe, without psychosis ([place]) Active Problems:   Paranoia ([place])  Long Term Goal(s): Improvement in symptoms so as ready for discharge  Short Term Goals: Ability to identify changes in lifestyle to reduce recurrence of condition will improve, Ability to maintain clinical measurements within normal limits will improve, Compliance with prescribed medications will improve and Ability to identify triggers associated with substance abuse/mental health issues will improve  I certify that inpatient services furnished can reasonably be expected to improve the  patient's condition.    Shuvon Rankin, NP 2/7/20195:05 PM   I have discussed case with NP and have met with patient  Agree with NP note and assessment  27 year old married male, has two children ( 6,8) who are currently with mother. On disability.  Patient came to ED on 2/6  - reports he has been depressed, which he attributes to going through divorce . ED notes indicate that he made suicidal statements , stating he wanted to jump off a bridge.  Patient states he made these statements during an argument with wife  and that he was not actually suicidal or intending to harm self . He reports neuro-vegetative symptoms of depression- poor sleep, low energy level, sense of sadness,anhedonia. Denies hallucinations, but reports vague sense of paranoia, which he describes as feeling that people are staring at him or possibly  laughing about him .  He does have a history of depression, prior suicidal attempts ( overdosed in November 2018), prior psychiatric admissions , most recently late last year.  Reports he had been on Seroquel and Zoloft in the past , ran out about a month ago.  Reports history of cannabis and alcohol abuse, but states he has not been drinking x 1 month. Admission BAL negative, admission UDS negative .  Denies medical illnesses, NKDA.  Dx- MDD. Alcohol Use Disorder in early remission  Plan- we discussed options- patient states he had done well on combination of Seroquel and Zoloft, without side effects and which he took for several months up to when he ran out a few weeks ago. Expresses interest in restarting these meds.  Seroquel 100 mgrs QHS Zoloft 50 mgrs QDAY  ( as on Seroquel, will discontinue Abilify , and will discontinue Cogentin, as EPS not likely on Seroquel )

## 2017-04-04 NOTE — Progress Notes (Signed)
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

## 2017-04-04 NOTE — [facility] Suicide Risk Assessment (Addendum)
[facility]   Nursing information obtained from:  Patient Demographic factors:  Male Current Mental Status:  Self-harm thoughts Loss Factors:  Financial problems / change in socioeconomic status, Loss of significant relationship Historical Factors:  Prior suicide attempts Risk Reduction Factors:  NA  Total Time spent with patient: 45 minutes Principal Problem:  Major Depression, Recurrent  Diagnosis:   Patient Active Problem List   Diagnosis Date Noted  . MDD (major depressive disorder), recurrent severe, without psychosis ([facility]) [F33.2] 04/03/2017  . Mood disorder ([facility]) [F39] 09/15/2016    Continued Clinical Symptoms:  Alcohol Use Disorder Identification Test Final Score (AUDIT): 6 The "Alcohol Use Disorders Identification Test", Guidelines for Use in Primary Care, Second Edition.  World Science writerHealth Organization [facility](WHO). Score between 0-7:  no or low risk or alcohol related problems. Score between 8-15:  moderate risk of alcohol related problems. Score between 16-19:  high risk of alcohol related problems. Score 20 or above:  warrants further diagnostic evaluation for alcohol dependence and treatment.   CLINICAL FACTORS:  27 year old married male, has two children ( 6,8) who are currently with mother. On disability.  Patient came to ED on 2/6  - reports he has been depressed, which he attributes to going through divorce . ED notes indicate that he made suicidal statements , stating he wanted to jump off a bridge.  Patient states he made these statements during an argument with wife  and that he was not actually suicidal or intending to harm self . He reports neuro-vegetative symptoms of depression- poor sleep, low energy level, sense of sadness,anhedonia. Denies hallucinations, but reports vague sense of paranoia, which he describes as feeling that people are staring at him or possibly laughing about him .  He does have a history of depression, prior suicidal  attempts ( overdosed in November 2018), prior psychiatric admissions , most recently late last year.  Reports he had been on Seroquel and Zoloft in the past , ran out about a month ago.  Reports history of cannabis and alcohol abuse, but states he has not been drinking x 1 month. Admission BAL negative, admission UDS negative .  Denies medical illnesses, NKDA.  Dx- MDD. Alcohol Use Disorder in early remission  Plan- we discussed options- patient states he had done well on combination of Seroquel and Zoloft, without side effects and which he took for several months up to when he ran out a few weeks ago. Expresses interest in restarting these meds.  Seroquel 100 mgrs QHS Zoloft 50 mgrs QDAY  ( as on Seroquel, will discontinue Abilify , and will discontinue Cogentin, as EPS not likely on Seroquel )    Musculoskeletal: Strength & Muscle Tone: within normal limits Gait & Station: normal Patient leans: N/A  Psychiatric Specialty Exam: Physical Exam  ROS no headache, no chest pain, no shortness of breath, no fever, no chills, no fever   Blood pressure 108/79, pulse 87, temperature (!) 97.5 F (36.4 C), resp. rate 18, height 5\' 8"  (1.727 m), weight 62.1 kg (137 lb).Body mass index is 20.83 kg/m.  General Appearance: Fairly Groomed  Eye Contact:  Fair  Speech:  Normal Rate  Volume:  Normal  Mood:  Depressed  Affect:  constricted , irritable   Thought Process:  Linear and Descriptions of Associations: Intact  Orientation:  Other:  fully alert and attentive   Thought Content:  denies hallucinations, no delusions endorses but reports self referential ideations   Suicidal Thoughts:  No  denies any suicidal ideations at this time, denies any homicidal or violent ideations   Homicidal Thoughts:  No  Memory:  recent and remote grossly intact   Judgement:  Fair  Insight:  Fair  Psychomotor Activity:  Normal  Concentration:  Concentration: Good and Attention Span: Good  Recall:  Good  Fund  of Knowledge:  Good  Language:  Good  Akathisia:  Negative  Handed:  Right  AIMS (if indicated):     Assets:  Communication Skills Desire for Improvement Resilience  ADL's:  Intact  Cognition:  WNL  Sleep:  Number of Hours: 6.75      COGNITIVE FEATURES THAT CONTRIBUTE TO RISK:  Closed-mindedness and Loss of executive function    SUICIDE RISK:   Moderate:  Frequent suicidal ideation with limited intensity, and duration, some specificity in terms of plans, no associated intent, good self-control, limited dysphoria/symptomatology, some risk factors present, and identifiable protective factors, including available and accessible social support.  PLAN OF CARE: Patient will be admitted to inpatient psychiatric unit for stabilization and safety. Will provide and encourage milieu participation. Provide medication management and maked adjustments as needed.  Will follow daily.    I certify that inpatient services furnished can reasonably be expected to improve the patient's condition.   Craige Cotta, MD 04/04/2017, 4:18 PM

## 2017-04-04 NOTE — [facility] Group Notes (Signed)
LCSW Group Therapy Note  04/04/2017 1:15pm  Type of Therapy/Topic:  Group Therapy:  Emotion Regulation  Participation Level:  Active   Description of Group:   The purpose of this group is to assist patients in learning to regulate negative emotions and experience positive emotions. Patients will be guided to discuss ways in which they have been vulnerable to their negative emotions. These vulnerabilities will be juxtaposed with experiences of positive emotions or situations, and patients will be challenged to use positive emotions to combat negative ones. Special emphasis will be placed on coping with negative emotions in conflict situations, and patients will process healthy conflict resolution skills.  Therapeutic Goals: 1. Patient will identify two positive emotions or experiences to reflect on in order to balance out negative emotions 2. Patient will label two or more emotions that they find the most difficult to experience 3. Patient will demonstrate positive conflict resolution skills through discussion and/or role plays  Summary of Patient Progress:  Chris Olson was attentive and engaged during today's processing group. He shared that he struggles with feelings of regret, anger, and depression. "I get really mad when people don't accept my apology." Chris Olson demonstrates limited insight but was able to participate in group discussion and was receptive to feedback from the group. He continues to show progress in the group setting with improving insight.    Therapeutic Modalities:   Cognitive Behavioral Therapy Feelings Identification Dialectical Behavioral Therapy   Chris PeoplesHeather N Smart, LCSW 04/04/2017 2:37 PM

## 2017-04-04 NOTE — Progress Notes (Signed)
Patient ID: Chris Olson, male   DOB: 10/01/1990, 27 y.o.   MRN: [id number] D: Patient has been visible in the milieu so far, and has been interactive with staff and peers.  Pt is cooperative with care and is calm, and denies SI/HI/AVH.  Pt reported that his sleep quality is good, reports his appetite as being good, reports that his energy level is normal, and that his concentration level is good.  Pt states that his depression level is 0 (10 being the highest level possible), denies being anxious, and denies being hopeless.  Pt has eaten breakfast and lunch so far, and has attended all group sessions for the day.  A: Q15 minute safety checks being maintained for pt's safety.  R: Pt denies having any current concerns, will continue to monitor.

## 2017-04-05 DIAGNOSIS — F17210 Nicotine dependence, cigarettes, uncomplicated: Secondary | ICD-10-CM

## 2017-04-05 DIAGNOSIS — F1290 Cannabis use, unspecified, uncomplicated: Secondary | ICD-10-CM

## 2017-04-05 LAB — LIPID PANEL
Cholesterol: 165 mg/dL (ref 0–200)
HDL: 44 mg/dL (ref >40)
LDL Cholesterol: 78 mg/dL (ref 0–99)
Total CHOL/HDL Ratio: 3.8 RATIO
Triglycerides: 213 mg/dL — ABNORMAL HIGH (ref <150)
VLDL: 43 mg/dL — ABNORMAL HIGH (ref 0–40)

## 2017-04-05 LAB — HEMOGLOBIN A1C
HEMOGLOBIN A1C: 5.2 % (ref 4.8–5.6)
MEAN PLASMA GLUCOSE: 102.54 mg/dL

## 2017-04-05 MED ORDER — ARIPIPRAZOLE 5 MG PO TABS
5.0000 mg | ORAL_TABLET | Freq: Every day | ORAL | Status: DC
  Filled 2017-04-05 (×2): qty 1

## 2017-04-05 NOTE — Progress Notes (Signed)
[facility] MD Progress Note  04/05/2017 1:46 PM Chris Olson  MRN:  [id number]   Subjective:  Patient reports " I need to discharged soon in order to help my wife and kids."  Objective: Lory Routt is awake, alert and oriented. Patient seen resting in bed with concerns of discharging soon. Denies suicidal or homicidal ideation. Denies auditory or visual hallucination and does not appear to be responding to internal stimuli.  Patient continues to express that  "everything is good."   Family phone session with social worker and patient's father is schedule for later today.   Patient reports he is medication compliant without mediation side effects. Dicussed will consider a possible weekend discharge.  Reports good appetite and states he is well. Support, encouragement and reassurance was provided.   Principal Problem: MDD (major depressive disorder), recurrent severe, without psychosis (HCC) Diagnosis:   Patient Active Problem List   Diagnosis Date Noted  . Paranoia (HCC) [F22] 04/04/2017  . MDD (major depressive disorder), recurrent severe, without psychosis (HCC) [F33.2] 04/03/2017  . Mood disorder (HCC) [F39] 09/15/2016   Total Time spent with patient: 20 minutes  Past Psychiatric History:   Past Medical History:  Past Medical History:  Diagnosis Date  . Chest pain   . Pericarditis    History reviewed. No pertinent surgical history. Family History: History reviewed. No pertinent family history. Family Psychiatric  History:  Social History:  Social History   Substance and Sexual Activity  Alcohol Use Yes     Social History   Substance and Sexual Activity  Drug Use Yes  . Types: Marijuana    Social History   Socioeconomic History  . Marital status: Single    Spouse name: None  . Number of children: None  . Years of education: None  . Highest education level: None  Social Needs  . Financial resource strain: None  . Food insecurity - worry: None  . Food insecurity -  inability: None  . Transportation needs - medical: None  . Transportation needs - non-medical: None  Occupational History  . None  Tobacco Use  . Smoking status: Current Every Day Smoker    Types: Cigarettes  . Smokeless tobacco: Never Used  Substance and Sexual Activity  . Alcohol use: Yes  . Drug use: Yes    Types: Marijuana  . Sexual activity: None  Other Topics Concern  . None  Social History Narrative  . None   Additional Social History:                         Sleep: Fair  Appetite:  Fair  Current Medications: Current Facility-Administered Medications  Medication Dose Route Frequency Provider Last Rate Last Dose  . acetaminophen (TYLENOL) tablet 650 mg  650 mg Oral Q6H PRN Laveda Abbe, NP      . alum & mag hydroxide-simeth (MAALOX/MYLANTA) 200-200-20 MG/5ML suspension 30 mL  30 mL Oral Q4H PRN Laveda Abbe, NP      . ARIPiprazole (ABILIFY) tablet 2 mg  2 mg Oral Daily Rankin, Shuvon B, NP   Stopped at 04/05/17 0830  . benztropine (COGENTIN) tablet 0.5 mg  0.5 mg Oral Daily Rankin, Shuvon B, NP   Stopped at 04/05/17 0830  . hydrOXYzine (ATARAX/VISTARIL) tablet 25 mg  25 mg Oral TID PRN Laveda Abbe, NP   25 mg at 04/03/17 2044  . magnesium hydroxide (MILK OF MAGNESIA) suspension 30 mL  30 mL Oral Daily PRN Arville Care,  Dorise HissLaurie Britton, NP      . nicotine polacrilex (NICORETTE) gum 2 mg  2 mg Oral PRN Georgiann CockerIzediuno, Vincent A, MD   2 mg at 04/05/17 0909  . QUEtiapine (SEROQUEL) tablet 100 mg  100 mg Oral QHS Kerry HoughSimon, Spencer E, PA-C   100 mg at 04/04/17 2203    Lab Results:  Results for orders placed or performed during the hospital encounter of 04/03/17 (from the past 48 hour(s))  Hemoglobin A1c     Status: None   Collection Time: 04/05/17  7:33 AM  Result Value Ref Range   Hgb A1c MFr Bld 5.2 4.8 - 5.6 %    Comment: (NOTE) Pre diabetes:          5.7%-6.4% Diabetes:              >6.4% Glycemic control for   <7.0% adults with diabetes     Mean Plasma Glucose 102.54 mg/dL    Comment: Performed at [facility], [address] [place], [place] [address]  Lipid panel     Status: Abnormal   Collection Time: 04/05/17  7:33 AM  Result Value Ref Range   Cholesterol 165 0 - 200 mg/dL   Triglycerides 914213 (H) <150 mg/dL   HDL 44 >78>40 mg/dL   Total CHOL/HDL Ratio 3.8 RATIO   VLDL 43 (H) 0 - 40 mg/dL   LDL Cholesterol 78 0 - 99 mg/dL    Comment:        Total Cholesterol/HDL:CHD Risk Coronary Heart Disease Risk Table                     Men   Women  1/2 Average Risk   3.4   3.3  Average Risk       5.0   4.4  2 X Average Risk   9.6   7.1  3 X Average Risk  23.4   11.0        Use the calculated Patient Ratio above and the CHD Risk Table to determine the patient's CHD Risk.        ATP III CLASSIFICATION (LDL):  <100     mg/dL   Optimal  295-621100-129  mg/dL   Near or Above                    Optimal  130-159  mg/dL   Borderline  [id number]  mg/dL   High  >846>190     mg/dL   Very High Performed at [facility], [address] [place], [place] [address]     Blood Alcohol level:  Lab Results  Component Value Date   ETH <10 04/03/2017    Metabolic Disorder Labs: Lab Results  Component Value Date   HGBA1C 5.2 04/05/2017   MPG 102.54 04/05/2017   MPG 94 09/11/2016   No results found for: PROLACTIN Lab Results  Component Value Date   CHOL 165 04/05/2017   TRIG 213 (H) 04/05/2017   HDL 44 04/05/2017   CHOLHDL 3.8 04/05/2017   VLDL 43 (H) 04/05/2017   LDLCALC 78 04/05/2017    Physical Findings: AIMS: Facial and Oral Movements Muscles of Facial Expression: None, normal Lips and Perioral Area: None, normal Jaw: None, normal Tongue: None, normal,Extremity Movements Upper (arms, wrists, hands, fingers): None, normal Lower (legs, knees, ankles, toes): None, normal, Trunk Movements Neck, shoulders, hips: None, normal, Overall Severity Severity of abnormal movements (highest score from questions  above): None, normal Incapacitation  due to abnormal movements: None, normal Patient's awareness of abnormal movements (rate only patient's report): No Awareness, Dental Status Current problems with teeth and/or dentures?: No Does patient usually wear dentures?: No  CIWA:    COWS:     Musculoskeletal: Strength & Muscle Tone: within normal limits Gait & Station: normal Patient leans: N/A  Psychiatric Specialty Exam: Physical Exam  Vitals reviewed. Constitutional: He appears well-developed.  Cardiovascular: Normal rate.  Neurological: He is alert.  Psychiatric: He has a normal mood and affect. His behavior is normal.    Review of Systems  Psychiatric/Behavioral: Positive for depression. The patient is nervous/anxious.   All other systems reviewed and are negative.   Blood pressure 115/65, pulse 69, temperature 98.2 F (36.8 C), resp. rate 18, height 5\' 8"  (1.727 m), weight 62.1 kg (137 lb).Body mass index is 20.83 kg/m.  General Appearance: Casual and Guarded  Eye Contact:  Fair  Speech:  Clear and Coherent  Volume:  Normal  Mood:  Depressed and Dysphoric  Affect:  Congruent  Thought Process:  Coherent  Orientation:  Full (Time, Place, and Person)  Thought Content:  Hallucinations: None  Suicidal Thoughts:  No  Homicidal Thoughts:  No  Memory:  Immediate;   Fair Recent;   Fair Remote;   Fair  Judgement:  Fair  Insight:  Fair  Psychomotor Activity:  Normal  Concentration:  Concentration: Fair  Recall:  [employer] of Knowledge:  Fair  Language:  Fair  Akathisia:  No  Handed:  Right  AIMS (if indicated):     Assets:  Communication Skills Desire for Improvement Resilience Social Support  ADL's:  Intact  Cognition:  WNL  Sleep:  Number of Hours: 4     Treatment Plan Summary: Daily contact with patient to assess and evaluate symptoms and progress in treatment and Medication management    Continue with current treatment plan on 04/05/2017 except where  noted  Continue with Cogentin 0.5mg , Seroquel 100 mg and Increased Abilify 2 mg to 5 mg  for mood stabilization.  Will continue to monitor vitals ,medication compliance and treatment side effects while patient is here.  CSW will start working on disposition.  Patient to participate in therapeutic milieu  Oneta Rack, NP 04/05/2017, 1:46 PM    Agree with NP Progress Note

## 2017-04-05 NOTE — Progress Notes (Signed)
Psychoeducational Group Note  Date:  04/04/2017 Time: 2045  Group Topic/Focus:  wrap up group  Participation Level: Did Not Attend  Participation Quality:  Not Applicable  Affect:  Not Applicable  Cognitive:  Not Applicable  Insight:  Not Applicable  Engagement in Group: Not Applicable  Additional Comments:  Pt was notified that group was beginning but remained in room.  Johann CapersMcNeil, Jorgina Binning S 04/05/2017, 12:03 AM

## 2017-04-05 NOTE — [facility] Group Notes (Signed)
LCSW Group Therapy Note 04/05/2017 3:26 PM  Type of Therapy and Topic: Group Therapy: Feelings around Relapse and Recovery  Participation Level: Did Not Attend   Description of Group:  Patients in this group will discuss emotions they experience before and after a relapse. They will process how experiencing these feelings, or avoidance of experiencing them, relates to having a relapse. Facilitator will guide patients to explore emotions they have related to recovery. Patients will be encouraged to process which emotions are more powerful. They will be guided to discuss the emotional reaction significant others in their lives may have to their relapse or recovery. Patients will be assisted in exploring ways to respond to the emotions of others without this contributing to a relapse.  Therapeutic Goals: 1. Patient will identify two or more emotions that lead to a relapse for them 2. Patient will identify two emotions that result when they relapse 3. Patient will identify two emotions related to recovery 4. Patient will demonstrate ability to communicate their needs through discussion and/or role plays  Summary of Patient Progress:   Invited, chose not to attend.    Therapeutic Modalities:  Cognitive Behavioral Therapy Solution-Focused Therapy Assertiveness Training Relapse Prevention Therapy   Chris Olson LCSWA Clinical Social Worker

## 2017-04-05 NOTE — Tx Team (Signed)
Interdisciplinary Treatment and Diagnostic Plan Update  04/05/2017 Time of Session: [id number] Chris CalamityLendell Olson MRN: [id number]  Principal Diagnosis: MDD (major depressive disorder), recurrent severe, without psychosis (HCC)  Secondary Diagnoses: Principal Problem:   MDD (major depressive disorder), recurrent severe, without psychosis (HCC) Active Problems:   Paranoia (HCC)   Current Medications:  Current Facility-Administered Medications  Medication Dose Route Frequency Provider Last Rate Last Dose  . acetaminophen (TYLENOL) tablet 650 mg  650 mg Oral Q6H PRN Laveda AbbeParks, Laurie Britton, NP      . alum & mag hydroxide-simeth (MAALOX/MYLANTA) 200-200-20 MG/5ML suspension 30 mL  30 mL Oral Q4H PRN Laveda AbbeParks, Laurie Britton, NP      . ARIPiprazole (ABILIFY) tablet 2 mg  2 mg Oral Daily Rankin, Shuvon B, NP   Stopped at 04/05/17 0830  . benztropine (COGENTIN) tablet 0.5 mg  0.5 mg Oral Daily Rankin, Shuvon B, NP   Stopped at 04/05/17 0830  . hydrOXYzine (ATARAX/VISTARIL) tablet 25 mg  25 mg Oral TID PRN Laveda AbbeParks, Laurie Britton, NP   25 mg at 04/03/17 2044  . magnesium hydroxide (MILK OF MAGNESIA) suspension 30 mL  30 mL Oral Daily PRN Laveda AbbeParks, Laurie Britton, NP      . nicotine polacrilex (NICORETTE) gum 2 mg  2 mg Oral PRN Izediuno, Delight OvensVincent A, MD      . QUEtiapine (SEROQUEL) tablet 100 mg  100 mg Oral QHS Donell SievertSimon, Spencer E, PA-C   100 mg at 04/04/17 2203   PTA Medications: Medications Prior to Admission  Medication Sig Dispense Refill Last Dose  . bacitracin ointment Apply 1 application topically 2 (two) times daily. (Patient not taking: Reported on 09/22/2015) 15 g 0 Not Taking  . indomethacin (INDOCIN) 50 MG capsule Take 1 capsule (50 mg total) by mouth 2 (two) times daily with a meal. (Patient not taking: Reported on 10/17/2015) 20 capsule 0 Not Taking  . naproxen (NAPROSYN) 500 MG tablet Take 1 tablet (500 mg total) by mouth 2 (two) times daily. (Patient not taking: Reported on 09/22/2015) 30 tablet 0 Not  Taking    Patient Stressors: Financial difficulties Marital or family conflict  Patient Strengths: General fund of knowledge Motivation for treatment/growth Physical Health  Treatment Modalities: Medication Management, Group therapy, Case management,  1 to 1 session with clinician, Psychoeducation, Recreational therapy.   Physician Treatment Plan for Primary Diagnosis: MDD (major depressive disorder), recurrent severe, without psychosis (HCC) Long Term Goal(s): Improvement in symptoms so as ready for discharge Improvement in symptoms so as ready for discharge   Short Term Goals: Ability to identify changes in lifestyle to reduce recurrence of condition will improve Ability to verbalize feelings will improve Ability to disclose and discuss suicidal ideas Ability to demonstrate self-control will improve Ability to identify and develop effective coping behaviors will improve Ability to identify triggers associated with substance abuse/mental health issues will improve Ability to identify changes in lifestyle to reduce recurrence of condition will improve Ability to maintain clinical measurements within normal limits will improve Compliance with prescribed medications will improve Ability to identify triggers associated with substance abuse/mental health issues will improve  Medication Management: Evaluate patient's response, side effects, and tolerance of medication regimen.  Therapeutic Interventions: 1 to 1 sessions, Unit Group sessions and Medication administration.  Evaluation of Outcomes: Progressing  Physician Treatment Plan for Secondary Diagnosis: Principal Problem:   MDD (major depressive disorder), recurrent severe, without psychosis (HCC) Active Problems:   Paranoia (HCC)  Long Term Goal(s): Improvement in symptoms so as ready for discharge  Improvement in symptoms so as ready for discharge   Short Term Goals: Ability to identify changes in lifestyle to reduce  recurrence of condition will improve Ability to verbalize feelings will improve Ability to disclose and discuss suicidal ideas Ability to demonstrate self-control will improve Ability to identify and develop effective coping behaviors will improve Ability to identify triggers associated with substance abuse/mental health issues will improve Ability to identify changes in lifestyle to reduce recurrence of condition will improve Ability to maintain clinical measurements within normal limits will improve Compliance with prescribed medications will improve Ability to identify triggers associated with substance abuse/mental health issues will improve     Medication Management: Evaluate patient's response, side effects, and tolerance of medication regimen.  Therapeutic Interventions: 1 to 1 sessions, Unit Group sessions and Medication administration.  Evaluation of Outcomes: Progressing   RN Treatment Plan for Primary Diagnosis: MDD (major depressive disorder), recurrent severe, without psychosis (HCC) Long Term Goal(s): Knowledge of disease and therapeutic regimen to maintain health will improve  Short Term Goals: Ability to remain free from injury will improve, Ability to disclose and discuss suicidal ideas and Ability to identify and develop effective coping behaviors will improve  Medication Management: RN will administer medications as ordered by provider, will assess and evaluate patient's response and provide education to patient for prescribed medication. RN will report any adverse and/or side effects to prescribing provider.  Therapeutic Interventions: 1 on 1 counseling sessions, Psychoeducation, Medication administration, Evaluate responses to treatment, Monitor vital signs and CBGs as ordered, Perform/monitor CIWA, COWS, AIMS and Fall Risk screenings as ordered, Perform wound care treatments as ordered.  Evaluation of Outcomes: Progressing   LCSW Treatment Plan for Primary  Diagnosis: MDD (major depressive disorder), recurrent severe, without psychosis (HCC) Long Term Goal(s): Safe transition to appropriate next level of care at discharge, Engage patient in therapeutic group addressing interpersonal concerns.  Short Term Goals: Engage patient in aftercare planning with referrals and resources, Facilitate patient progression through stages of change regarding substance use diagnoses and concerns and Increase skills for wellness and recovery  Therapeutic Interventions: Assess for all discharge needs, 1 to 1 time with Social worker, Explore available resources and support systems, Assess for adequacy in community support network, Educate family and significant other(s) on suicide prevention, Complete Psychosocial Assessment, Interpersonal group therapy.  Evaluation of Outcomes: Progressing   Progress in Treatment: Attending groups: Yes. Participating in groups: Yes. Taking medication as prescribed: Yes. Toleration medication: Yes. Family/Significant other contact made: No, will contact:  pt's father for collateral contact. SPI pamphlet and [facility] Crisis informaton provided to pt as well.  Patient understands diagnosis: Yes. Discussing patient identified problems/goals with staff: Yes. Medical problems stabilized or resolved: Yes Denies suicidal/homicidal ideation: Yes. Issues/concerns per patient self-inventory: No. Other: n/a   New problem(s) identified: No, Describe:  n/a  New Short Term/Long Term Goal(s): Medication management for mood stabilization; elimination of SI thoughts; development of comprehensive mental wellness/sobriety plan.   Patient Goal: "To get back on my medicine and figure out how to get a support system for outside of the hospital."   Discharge Plan or Barriers: CSW assessing. Pt plans to attend [facility] for outpatient mental health follow-up. MHAG pamphlet and AA/NA list provided for additional community support. He plans to stay with  either his wife or father until his tax money comes in and he can get his own apartment at discharge.  .  Reason for Continuation of Hospitalization: Anxiety Depression Medication stabilization  Estimated Length of Stay:  Monday, 04/08/17  Attendees: Patient: Chris Olson  04/05/2017 8:58 AM  Physician: Dr. Jama Flavors MD; Dr. Altamese [place] MD 04/05/2017 8:58 AM  Nursing: Rayfield Citizen RN; [place] RN 04/05/2017 8:58 AM  RN Care Manager: Onnie Boer CM 04/05/2017 8:58 AM  Social Worker: Trula Slade, LCSW 04/05/2017 8:58 AM  Recreational Therapist: x 04/05/2017 8:58 AM  Other: Armandina Stammer NP; Hillery Jacks NP 04/05/2017 8:58 AM  Other:  04/05/2017 8:58 AM  Other: 04/05/2017 8:58 AM    Scribe for Treatment Team: Ledell Peoples Smart, LCSW 04/05/2017 8:58 AM

## 2017-04-05 NOTE — Progress Notes (Signed)
Adult Psychoeducational Group Note  Date:  04/05/2017 Time:  7:24 PM  Group Topic/Focus:  Relapse Prevention Planning:   The focus of this group is to define relapse and discuss the need for planning to combat relapse.  Participation Level:  Active  Participation Quality:  Appropriate, Attentive, Sharing and Supportive  Affect:  Appropriate  Cognitive:  Alert, Appropriate and Oriented  Insight: Improving  Engagement in Group:  Developing/Improving  Modes of Intervention:  Discussion, Education, Problem-solving and Support  Additional Comments:  Pt. Shared openly about hope for his future and spoke with peers about the importance of obtaining a sponsor and going to groups. Problems solved to avoid substance use  Delila PereyraMichels, Kyaire Gruenewald Louise 04/05/2017, 7:24 PM

## 2017-04-05 NOTE — [facility] Suicide Risk Assessment (Signed)
[facility] INPATIENT:  Family/Significant Other Suicide Prevention Education  Suicide Prevention Education:  Education Completed; Chris Olson (pt's father) [phone] has been identified by the patient as the family member/significant other with whom the patient will be residing, and identified as the person(s) who will aid the patient in the event of a mental health crisis (suicidal ideations/suicide attempt).  With written consent from the patient, the family member/significant other has been provided the following suicide prevention education, prior to the and/or following the discharge of the patient.  The suicide prevention education provided includes the following:  Suicide risk factors  Suicide prevention and interventions  National Suicide Hotline telephone number  [facility] assessment telephone number  [facility] Emergency Assistance 911  [facility] and/or Residential Mobile Crisis Unit telephone number  Request made of family/significant other to:  Remove weapons (e.g., guns, rifles, knives), all items previously/currently identified as safety concern.    Remove drugs/medications (over-the-counter, prescriptions, illicit drugs), all items previously/currently identified as a safety concern.  The family member/significant other verbalizes understanding of the suicide prevention education information provided.  The family member/significant other agrees to remove the items of safety concern listed above.  SPE and aftercare reviewed with pt's father. Pt's father states that pt does not have access to weapons/firearms. He states that he wishes he could force his son into long term treatment but understands that his son must make that decision for himself. He reports no other questions or concerns at this time.   Chris Olson Smart LCSW 04/05/2017, 11:17 AM

## 2017-04-05 NOTE — Progress Notes (Signed)
Recreation Therapy Notes  Date: 04/05/17 Time: 0930 Location: 300 [address] Dayroom  Group Topic: Stress Management  Goal Area(s) Addresses:  Patient will verbalize importance of using healthy stress management.  Patient will identify positive emotions associated with healthy stress management.   Behavioral Response: Engaged  Intervention: Stress Management  Activity :  Progressive Muscle Relaxation.  LRT introduced the stress management technique of progressive muscle relaxation.  Patients were to follow along as LRT read script to tense each muscle group and then relax them separately.   Education:  Stress Management, Discharge Planning.   Education Outcome: Acknowledges edcuation/In group clarification offered/Needs additional education  Clinical Observations/Feedback: Pt attended group.     Maritza Goldsborough, LRT/CTRS         Elaine Roanhorse A 04/05/2017 11:59 AM 

## 2017-04-05 NOTE — Progress Notes (Signed)
D: Patient refused his abilify and cogentin this morning stating, "I'm supposed to be on seroquel.  Patient wanted to speak with MD regarding his medications.  He is pleasant upon approach.  He denies any depressive or thoughts of self harm.  He is visible in the milieu and seen interacting with his peers.  He is sleeping and eating well; his energy level is normal and his concentration is good.  He wrote on his self inventory that he is "ready to go home."  A: Continue to monitor medication management and MD orders.  Safety checks continued every 15 minutes per protocol.  Offer support and encouragement as needed.  R: Patient is receptive to staff; his behavior is appropriate.

## 2017-04-05 NOTE — Progress Notes (Signed)
D   Pt is pleasant on approach and cooperative  He interacts appropriately with others   He attends groups   He did not request medications for sleep he just wanted his scheduled medications A   Verbal support given   Medication administered and effectiveness monitored   Q 15 min checks  R   Pt is safe at present time

## 2017-04-06 MED ORDER — NICOTINE 21 MG/24HR TD PT24
MEDICATED_PATCH | TRANSDERMAL | Status: AC
  Filled 2017-04-06: qty 1

## 2017-04-06 MED ORDER — NICOTINE POLACRILEX 2 MG MT GUM
CHEWING_GUM | OROMUCOSAL | Status: AC
  Filled 2017-04-06: qty 1

## 2017-04-06 MED ORDER — SERTRALINE HCL 25 MG PO TABS
25.0000 mg | ORAL_TABLET | Freq: Every day | ORAL | Status: DC
  Administered 2017-04-06 – 2017-04-07 (×2): 25 mg via ORAL
  Filled 2017-04-06 (×4): qty 1

## 2017-04-06 MED ORDER — NICOTINE 21 MG/24HR TD PT24
21.0000 mg | MEDICATED_PATCH | Freq: Every day | TRANSDERMAL | Status: DC
  Administered 2017-04-06: 21 mg via TRANSDERMAL
  Filled 2017-04-06 (×2): qty 1

## 2017-04-06 MED ORDER — NICOTINE POLACRILEX 2 MG MT GUM
2.0000 mg | CHEWING_GUM | OROMUCOSAL | Status: DC | PRN
  Administered 2017-04-07: 2 mg via ORAL
  Filled 2017-04-06: qty 1

## 2017-04-06 MED ORDER — SERTRALINE HCL 50 MG PO TABS
50.0000 mg | ORAL_TABLET | Freq: Every day | ORAL | Status: DC
  Filled 2017-04-06 (×2): qty 1

## 2017-04-06 MED ORDER — SERTRALINE HCL 25 MG PO TABS: 25.0000 mg | ORAL_TABLET | Freq: Every day | ORAL | Status: DC

## 2017-04-06 NOTE — Progress Notes (Signed)
D   Pt is pleasant on approach and cooperative  He interacts appropriately with others   He attends groups   He did not request medications for sleep he just wanted his scheduled medications   Pt expressed desire to be discharged in the morning and requesting help with housing  A   Verbal support given   Medication administered and effectiveness monitored   Q 15 min checks    Encouraged pt to talk to his social worker in the morning R   Pt is safe at present time

## 2017-04-06 NOTE — [facility] Group Notes (Signed)
[facility] Group Notes:  (Nursing/MHT/Case Management/Adjunct)  Date:  04/06/2017  Time:  4:39 PM  Type of Therapy:  Goals/Orientation Group.  Participation Level:  Active  Participation Quality:  Appropriate  Affect:  Appropriate  Cognitive:  Appropriate  Insight:  Appropriate  Engagement in Group:  Engaged  Modes of Intervention:  Discussion  Summary of Progress/Problems: Pt attended goals/orientation group, pt was receptive.   Jacquelyne BalintForrest, Tasmine Hipwell Shanta 04/06/2017, 4:39 PM

## 2017-04-06 NOTE — Progress Notes (Signed)
[facility] MD Progress Note  04/06/2017 2:51 PM Chris Olson  MRN:  [id number]   Subjective:  Patient reports " I am feeling ready to discharge now that I am back on my right medications"   Objective: Chris Olson is awake, alert and oriented. Patient reports he didn't like the Abilify and is requesting to be restarted on Zoloft and Seroquel . Reports he was prescribed Zoloft 50 mg however has been of his medication for the past few months. Discussed restarting Zoloft 25 mg. Patient is requesting to be discharged as soon a possible.  Reports he dosnt have a plan as of now, however is sure that he will be able to sleep on his wife couch. States he will get his own apartment after his taxes come back.   Denies suicidal or homicidal ideation. Denies auditory or visual hallucination and does not appear to be responding to internal stimuli.    Patient reports he is medication compliant without mediation side effects. Support, encouragement and reassurance was provided.   Principal Problem: MDD (major depressive disorder), recurrent severe, without psychosis (HCC) Diagnosis:   Patient Active Problem List   Diagnosis Date Noted  . Paranoia (HCC) [F22] 04/04/2017  . MDD (major depressive disorder), recurrent severe, without psychosis (HCC) [F33.2] 04/03/2017  . Mood disorder (HCC) [F39] 09/15/2016   Total Time spent with patient: 20 minutes  Past Psychiatric History:   Past Medical History:  Past Medical History:  Diagnosis Date  . Chest pain   . Pericarditis    History reviewed. No pertinent surgical history. Family History: History reviewed. No pertinent family history. Family Psychiatric  History:  Social History:  Social History   Substance and Sexual Activity  Alcohol Use Yes     Social History   Substance and Sexual Activity  Drug Use Yes  . Types: Marijuana    Social History   Socioeconomic History  . Marital status: Single    Spouse name: None  . Number of children: None   . Years of education: None  . Highest education level: None  Social Needs  . Financial resource strain: None  . Food insecurity - worry: None  . Food insecurity - inability: None  . Transportation needs - medical: None  . Transportation needs - non-medical: None  Occupational History  . None  Tobacco Use  . Smoking status: Current Every Day Smoker    Types: Cigarettes  . Smokeless tobacco: Never Used  Substance and Sexual Activity  . Alcohol use: Yes  . Drug use: Yes    Types: Marijuana  . Sexual activity: None  Other Topics Concern  . None  Social History Narrative  . None   Additional Social History:                         Sleep: Fair  Appetite:  Fair  Current Medications: Current Facility-Administered Medications  Medication Dose Route Frequency Provider Last Rate Last Dose  . nicotine (NICODERM CQ - dosed in mg/24 hours) 21 mg/24hr patch           . acetaminophen (TYLENOL) tablet 650 mg  650 mg Oral Q6H PRN Laveda Abbe, NP      . alum & mag hydroxide-simeth (MAALOX/MYLANTA) 200-200-20 MG/5ML suspension 30 mL  30 mL Oral Q4H PRN Laveda Abbe, NP      . hydrOXYzine (ATARAX/VISTARIL) tablet 25 mg  25 mg Oral TID PRN Laveda Abbe, NP   25 mg at  04/03/17 2044  . magnesium hydroxide (MILK OF MAGNESIA) suspension 30 mL  30 mL Oral Daily PRN Laveda Abbe, NP      . nicotine (NICODERM CQ - dosed in mg/24 hours) patch 21 mg  21 mg Transdermal Q0600 Oneta Rack, NP   21 mg at 04/06/17 0823  . QUEtiapine (SEROQUEL) tablet 100 mg  100 mg Oral QHS Donell Sievert E, PA-C   100 mg at 04/05/17 2202  . sertraline (ZOLOFT) tablet 25 mg  25 mg Oral Daily Oneta Rack, NP   25 mg at 04/06/17 1610    Lab Results:  Results for orders placed or performed during the hospital encounter of 04/03/17 (from the past 48 hour(s))  Hemoglobin A1c     Status: None   Collection Time: 04/05/17  7:33 AM  Result Value Ref Range   Hgb A1c MFr Bld  5.2 4.8 - 5.6 %    Comment: (NOTE) Pre diabetes:          5.7%-6.4% Diabetes:              >6.4% Glycemic control for   <7.0% adults with diabetes    Mean Plasma Glucose 102.54 mg/dL    Comment: Performed at [facility], [address], [place], [place] [address]  Lipid panel     Status: Abnormal   Collection Time: 04/05/17  7:33 AM  Result Value Ref Range   Cholesterol 165 0 - 200 mg/dL   Triglycerides 409 (H) <150 mg/dL   HDL 44 >81 mg/dL   Total CHOL/HDL Ratio 3.8 RATIO   VLDL 43 (H) 0 - 40 mg/dL   LDL Cholesterol 78 0 - 99 mg/dL    Comment:        Total Cholesterol/HDL:CHD Risk Coronary Heart Disease Risk Table                     Men   Women  1/2 Average Risk   3.4   3.3  Average Risk       5.0   4.4  2 X Average Risk   9.6   7.1  3 X Average Risk  23.4   11.0        Use the calculated Patient Ratio above and the CHD Risk Table to determine the patient's CHD Risk.        ATP III CLASSIFICATION (LDL):  <100     mg/dL   Optimal  191-478  mg/dL   Near or Above                    Optimal  130-159  mg/dL   Borderline  295-621  mg/dL   High  >308     mg/dL   Very High Performed at [facility], [address], [place], [place] [address]     Blood Alcohol level:  Lab Results  Component Value Date   ETH <10 04/03/2017    Metabolic Disorder Labs: Lab Results  Component Value Date   HGBA1C 5.2 04/05/2017   MPG 102.54 04/05/2017   MPG 94 09/11/2016   No results found for: PROLACTIN Lab Results  Component Value Date   CHOL 165 04/05/2017   TRIG 213 (H) 04/05/2017   HDL 44 04/05/2017   CHOLHDL 3.8 04/05/2017   VLDL 43 (H) 04/05/2017   LDLCALC 78 04/05/2017    Physical Findings: AIMS: Facial and Oral Movements Muscles of Facial Expression: None, normal Lips and  Perioral Area: None, normal Jaw: None, normal Tongue: None, normal,Extremity Movements Upper (arms, wrists, hands, fingers): None, normal Lower (legs, knees, ankles,  toes): None, normal, Trunk Movements Neck, shoulders, hips: None, normal, Overall Severity Severity of abnormal movements (highest score from questions above): None, normal Incapacitation due to abnormal movements: None, normal Patient's awareness of abnormal movements (rate only patient's report): No Awareness, Dental Status Current problems with teeth and/or dentures?: No Does patient usually wear dentures?: No  CIWA:    COWS:     Musculoskeletal: Strength & Muscle Tone: within normal limits Gait & Station: normal Patient leans: N/A  Psychiatric Specialty Exam: Physical Exam  Vitals reviewed. Constitutional: He appears well-developed.  Cardiovascular: Normal rate.  Neurological: He is alert.  Psychiatric: He has a normal mood and affect. His behavior is normal.    Review of Systems  Psychiatric/Behavioral: Positive for depression. The patient is nervous/anxious.   All other systems reviewed and are negative.   Blood pressure 122/90, pulse 90, temperature 97.6 F (36.4 C), temperature source Oral, resp. rate 20, height 5\' 8"  (1.727 m), weight 62.1 kg (137 lb).Body mass index is 20.83 kg/m.  General Appearance: Casual and Guarded  Eye Contact:  Fair  Speech:  Clear and Coherent  Volume:  Normal  Mood:  Depressed and Dysphoric  Affect:  Congruent  Thought Process:  Coherent  Orientation:  Full (Time, Place, and Person)  Thought Content:  Hallucinations: None  Suicidal Thoughts:  No  Homicidal Thoughts:  No  Memory:  Immediate;   Fair Recent;   Fair Remote;   Fair  Judgement:  Fair  Insight:  Fair  Psychomotor Activity:  Normal  Concentration:  Concentration: Fair  Recall:  [employer]Fair  Fund of Knowledge:  Fair  Language:  Fair  Akathisia:  No  Handed:  Right  AIMS (if indicated):     Assets:  Communication Skills Desire for Improvement Resilience Social Support  ADL's:  Intact  Cognition:  WNL  Sleep:  Number of Hours: 6.25     Treatment Plan Summary: Daily  contact with patient to assess and evaluate symptoms and progress in treatment and Medication management    Continue with current treatment plan on 04/06/2017 except where noted  Mood stabilization:   Discontinue with Cogentin 0.5mg   Discontinued Abilify 5 mg    . Restarted Zoloft 25 mg  Continue Seroquel 100 mg   Will continue to monitor vitals ,medication compliance and treatment side effects while patient is here.  CSW will start working on disposition.  Patient to participate in therapeutic milieu  Oneta Rackanika N Lewis, NP 04/06/2017, 2:51 PM  Agree with NP Progress Note

## 2017-04-06 NOTE — Progress Notes (Signed)
Patient ID: Chris Olson, male   DOB: 10/02/1990, 27 y.o.   MRN: [id number]    D: Pt has been appropriate today on the unit today, he attended all groups and engaged in treatment. Pt reported that he was supposed to be taken off Abilify and started on Zoloft, Tanika NP was made aware new orders noted. Pt took his first dose of Zoloft today. Pt reported that his depression was a 0, his hopelessness was a 0, and his anxiety was a 0. Pt reported that his goal for today was to stay on track. Pt reported being negative SI/HI, no AH/VH noted. A: 15 min checks continued for patient safety. R: Pt safety maintained.

## 2017-04-06 NOTE — [facility] Group Notes (Signed)
LCSW Group Therapy Note  04/06/2017 9:30-10:30AM - 300 Hall, 10:30-11:30 - 400 Hall, 11:30-12:00 - 500 Hall  Type of Therapy and Topic:  Group Therapy: Anger Cues and Responses  Participation Level:  Active   Description of Group:   In this group, patients learned how to recognize the physical, cognitive, emotional, and behavioral responses they have to anger-provoking situations.  They identified a recent time they became angry and how they reacted.  They analyzed how their reaction was possibly beneficial and how it was possibly unhelpful.  The group discussed a variety of healthier coping skills that could help with such a situation in the future.  Deep breathing was practiced briefly.  Therapeutic Goals: 1. Patients will remember their last incident of anger and how they felt emotionally and physically, what their thoughts were at the time, and how they behaved. 2. Patients will identify how their behavior at that time worked for them, as well as how it worked against them. 3. Patients will explore possible new behaviors to use in future anger situations. 4. Patients will learn that anger itself is normal and cannot be eliminated, and that healthier reactions can assist with resolving conflict rather than worsening situations.  Summary of Patient Progress:  The patient shared that their most recent time of anger was about a week ago before [facility] and he participated fully, not always with appropriate comments, but redirectable.  Therapeutic Modalities:   Cognitive Behavioral Therapy  Lynnell ChadMareida J Grossman-Orr  04/06/2017 8:31 AM

## 2017-04-06 NOTE — [facility] Group Notes (Signed)
[facility] Group Notes:  (Nursing/MHT/Case Management/Adjunct)  Date:  04/06/2017  Time:  4:45 PM  Type of Therapy:  Psychoeducational Skills  Participation Level:  Active  Participation Quality:  Appropriate  Affect:  Appropriate  Cognitive:  Appropriate  Insight:  Appropriate  Engagement in Group:  Engaged  Modes of Intervention:  Problem-solving  Summary of Progress/Problems: Pt attended Psychoeducational group with top topic anger management.   Jacquelyne BalintForrest, Marckus Hanover Shanta 04/06/2017, 4:45 PM

## 2017-04-07 MED ORDER — SERTRALINE HCL 25 MG PO TABS: 25.0000 mg | ORAL_TABLET | Freq: Every day | ORAL | 0 refills | Status: DC

## 2017-04-07 MED ORDER — QUETIAPINE FUMARATE 100 MG PO TABS: 100.0000 mg | ORAL_TABLET | Freq: Every day | ORAL | 0 refills | Status: DC

## 2017-04-07 MED ORDER — NICOTINE POLACRILEX 2 MG MT GUM: 2.0000 mg | CHEWING_GUM | OROMUCOSAL | 0 refills | Status: DC | PRN

## 2017-04-07 NOTE — [facility] Suicide Risk Assessment (Addendum)
Chris Olson Discharge Suicide Risk Assessment   Principal Problem: MDD (major depressive disorder), recurrent severe, without psychosis (HCC) Discharge Diagnoses:  Patient Active Problem List   Diagnosis Date Noted  . Paranoia (HCC) [F22] 04/04/2017  . MDD (major depressive disorder), recurrent severe, without psychosis (HCC) [F33.2] 04/03/2017  . Mood disorder (HCC) [F39] 09/15/2016    Total Time spent with patient: 30 minutes  Musculoskeletal: Strength & Muscle Tone: within normal limits Gait & Station: normal Patient leans: N/A  Psychiatric Specialty Exam: ROS no headache, no nausea, no vomiting, no chest pain, no shortness of breath  Blood pressure 120/80, pulse 60, temperature 98.2 F (36.8 C), temperature source Oral, resp. rate 20, height 5\' 8"  (1.727 m), weight 62.1 kg (137 lb).Body mass index is 20.83 kg/m.  General Appearance: improved grooming   Eye Contact::  Good  Speech:  Normal Rate409  Volume:  Normal  Mood:  reports he is feeling "OK", states " I feel my mind is clearer", denies depression  Affect:  more reactive   Thought Process:  Linear and Descriptions of Associations: Intact  Orientation:  Other:  fully alert and attentive  Thought Content:  no hallucinations, no delusions, not internally preoccupied, future oriented States he has been feeling less anxious in group settings , with less concerns about people talking about him or laughing at him.  Suicidal Thoughts:  No denies suicidal ideations, denies self injurious ideations, presents future oriented, states " I am going to stay with my wife for a little while but I am going to move out on my own when I have the money"  Homicidal Thoughts:  No denies any homicidal ideations, specifically denies any homicidal or violent ideations towards wife   Memory:  recent and remote grossly intact   Judgement:  Other:  improving   Insight:  improving  Psychomotor Activity:  Normal  Concentration:  Good  Recall:  Good  Fund  of Knowledge:Good  Language: Good  Akathisia:  Negative  Handed:  Right  AIMS (if indicated):     Assets:  Communication Skills Desire for Improvement Resilience  Sleep:  Number of Hours: 6.25  Cognition: WNL  ADL's:  Intact   Mental Status Per Nursing Assessment::   On Admission:  Self-harm thoughts  Demographic Factors:  27 year old male , separated, has two children, on disability  Loss Factors: Marital issues, separation, disability Medication non compliance prior to admission  Historical Factors: History of depression, history of prior suicide attempt by overdose 11/18.   Risk Reduction Factors:   Sense of responsibility to family and Positive coping skills or problem solving skills  Continued Clinical Symptoms:  At this time patient is alert, attentive, calm, mood is reported as improving, affect is fuller in range, smiles at times appropriately, no thought disorder, no suicidal or self injurious ideations, no homicidal or violent ideations, and specifically denies any homicidal or violent ideations towards his wife, no hallucinations, no delusions, decreased self referential ideations. States "  I felt better each group I went to group, I did not feel so anxious", and states he is not currently feeling as though patient are talking about him behind his back. He presents future oriented at this time. States " my wife is going to let me stay at home for a period of time until I can get a place of my own". States " I am thinking of going to [employer]  Denies medication side effects. Side effects discussed including potential for sedation, weight  gain, metabolic and motor side effects.  On unit has remained calm, interactive with peers, visible on unit, currently calm , pleasant on approach.  Cognitive Features That Contribute To Risk:  No gross cognitive deficits noted upon discharge. Is alert , attentive, and oriented x 3   Suicide Risk:  Mild:  Suicidal ideation of  limited frequency, intensity, duration, and specificity.  There are no identifiable plans, no associated intent, mild dysphoria and related symptoms, good self-control (both objective and subjective assessment), few other risk factors, and identifiable protective factors, including available and accessible social support.  Follow-up Information    [facility] Follow up on 04/10/2017.   Specialty:  Behavioral Health Why:  Hospital follow-up on Wednesday, 2/13 at 8:45AM. Please bring: photo ID, medicaid card, and hospital discharge paperwork. Thank you.  Contact information: [address] [place] [id number] [phone]           Plan Of Care/Follow-up recommendations:  Activity:  as tolerated  Diet:  Regular  Tests:  NA Other:  See below  Patient is expressing readiness for discharge and there are no current grounds for ongoing involuntary commitment  He is leaving in good spirits. States his wife is coming to pick up later today . As above , patient states he will stay with her for several days ( they are separating but still living in the same household) but plans to get a place of his own in the near future once he has the funds . Follow up as above .    Craige CottaFernando A Cobos, MD 04/07/2017, 1:20 PM

## 2017-04-07 NOTE — [facility] Group Notes (Signed)
[facility] Group Notes:  (Nursing/MHT/Case Management/Adjunct)  Date:  04/07/2017  Time:  1:54 PM  Type of Therapy:  Psychoeducational Skills  Participation Level:  Did Not Attend  Participation Quality:  Did not attend  Affect:  Did not attend  Cognitive:  Did not attend  Insight:  None  Engagement in Group:  Did not attend  Modes of Intervention:  Did not attend  Summary of Progress/Problems: Pt did not attend Psychoeducational group with topic healthy support systems.   Jacquelyne BalintForrest, Ledarrius Beauchaine Shanta 04/07/2017, 1:54 PM

## 2017-04-07 NOTE — [facility] Group Notes (Signed)
[facility]  Date/Time:  04/07/2017 10:00-11:00AM  Type of Therapy and Topic:  Group Therapy:  Healthy and Unhealthy Supports  Participation Level:  Active   Description of Group:  Patients in this group were introduced to the idea of adding a variety of healthy supports to address the various needs in their lives.Patients discussed what additional healthy supports could be helpful in their recovery and wellness after discharge in order to prevent future hospitalizations.   An emphasis was placed on using counselor, doctor, therapy groups, 12-step groups, and problem-specific support groups to expand supports.  They also worked as a group on developing a specific plan for several patients to deal with unhealthy supports through boundary-setting, psychoeducation with loved ones, and even termination of relationships.   Therapeutic Goals:   1)  discuss importance of adding supports to stay well once out of the hospital  2)  compare healthy versus unhealthy supports and identify some examples of each  3)  generate ideas and descriptions of healthy supports that can be added  4)  offer mutual support about how to address unhealthy supports  5)  encourage active participation in and adherence to discharge plan    Summary of Patient Progress:  The patient expressed a willingness to add support people and a healthier environment, including a therapist, to help in his recovery journey.  He said there are drugs everywhere in his world and indicated a real doubt that he will be able to create boundaries to avoid being around them.   Therapeutic Modalities:   Motivational Interviewing Brief Solution-Focused Therapy  Ambrose MantleMareida Grossman-Orr, LCSW

## 2017-04-07 NOTE — [facility] Group Notes (Signed)
[facility] Group Notes:  (Nursing/MHT/Case Management/Adjunct)  Date:  04/07/2017  Time:  11:51 AM  Type of Therapy:  Goals/Orientation Group.  Participation Level:  Active  Participation Quality:  Appropriate  Affect:  Appropriate  Cognitive:  Appropriate  Insight:  Appropriate  Engagement in Group:  Engaged  Modes of Intervention:  Discussion  Summary of Progress/Problems: Pt attended goals/orientation group, pt was receptive.   Jacquelyne BalintForrest, Yong Grieser Shanta 04/07/2017, 11:51 AM

## 2017-04-07 NOTE — Discharge Summary (Signed)
Physician Discharge Summary Note  Patient:  Chris Olson is an 27 y.o., male MRN:  [id number] DOB:  02/09/1991 Patient phone:  [phone] (home)  Patient address:   [address] [place] [id number],  Total Time spent with patient: 15 minutes  Date of Admission:  04/03/2017 Date of Discharge: 04/07/2017  Reason for Admission: Per assessment note- Chris Olson, 27 y.o., male [facility] after presenting to ED under IVC by his wife; with complaints that after an argument with his wife he told her that he was going to kill himself by jumping off bridge.  Patient also had complaints of paranoia stating he felt that people were watching and laughing at him.    Patient seen face to face by this provider; chart reviewed and consulted with Dr. Jama Flavorsobos and treatment team on 04/04/17.  On evaluation [name] reports that he is in the hospital because he was having depression related to him and his wife going through a divorce.  Patient states "I try to respect my wife and what she wants but get depressed when things don't go my way.  She acts like she doesn't even want me to hug her.  I just need to get my life together and get more independent so I can take care of my self and my kids."  Patient states that he has not had suicidal thoughts since admission but still feels worthless, hopeless, and useless.  Patient states that he has prior history psychiatric inpatient treatment and has taken psychotropic in past.  At this time patient denies homicidal ideation and psychosis; but states that he does have paranoia feel that everyone is talking about him and laughing at him.  States that he does have a violent history but no violence in the last 10 yrs.  patient states that he is currently living with his wife but waiting on taxes to come back so that he can get his own place.  Unemployed but gets disability related to his psychiatric illness.       Principal Problem: MDD (major  depressive disorder), recurrent severe, without psychosis [facility](HCC) Discharge Diagnoses: Patient Active Problem List   Diagnosis Date Noted  . Paranoia (HCC) [F22] 04/04/2017  . MDD (major depressive disorder), recurrent severe, without psychosis (HCC) [F33.2] 04/03/2017  . Mood disorder (HCC) [F39] 09/15/2016    Past Psychiatric History:   Past Medical History:  Past Medical History:  Diagnosis Date  . Chest pain   . Pericarditis    History reviewed. No pertinent surgical history. Family History: History reviewed. No pertinent family history. Family Psychiatric  History:  Social History:  Social History   Substance and Sexual Activity  Alcohol Use Yes     Social History   Substance and Sexual Activity  Drug Use Yes  . Types: Marijuana    Social History   Socioeconomic History  . Marital status: Single    Spouse name: None  . Number of children: None  . Years of education: None  . Highest education level: None  Social Needs  . Financial resource strain: None  . Food insecurity - worry: None  . Food insecurity - inability: None  . Transportation needs - medical: None  . Transportation needs - non-medical: None  Occupational History  . None  Tobacco Use  . Smoking status: Current Every Day Smoker    Types: Cigarettes  . Smokeless tobacco: Never Used  Substance and Sexual Activity  . Alcohol use: Yes  . Drug  use: Yes    Types: Marijuana  . Sexual activity: None  Other Topics Concern  . None  Social History Narrative  . None    Hospital Course: Britain Spurgin was admitted for MDD (major depressive disorder), recurrent severe, without psychosis (HCC) , with psychosis and crisis management.  Pt was treated discharged with the medications listed below under Medication List.  Medical problems were identified and treated as needed.  Home medications were restarted as appropriate.  Improvement was monitored by observation and Terryn Hooley 's daily report of  symptom reduction.  Emotional and mental status was monitored by daily self-inventory reports completed by Frankey Ballentine and clinical staff.         Carless Vaeth was evaluated by the treatment team for stability and plans for continued recovery upon discharge. Farren Stetler 's motivation was an integral factor for scheduling further treatment. Employment, transportation, bed availability, health status, family support, and any pending legal issues were also considered during hospital stay. Pt was offered further treatment options upon discharge including but not limited to Residential, Intensive Outpatient, and Outpatient treatment.  Kaimana Favaro will follow up with the services as listed below under Follow Up Information.     Upon completion of this admission the patient was both mentally and medically stable for discharge denying suicidal or homicidal ideation, auditory visual delusional thoughts and paranoia.    Honor Bonus responded well to treatment with Abilify and  Seroquel without adverse effects. Initially, pt did complain of " not feeling my self with the Abilify"  with these medications, but this resolved and NP discontinued the Abilify and Restarted Zoloft. Pt demonstrated improvement without reported or observed adverse effects to the point of stability appropriate for outpatient management. Pertinent labs include: Lipid and CBC, CMP  for which outpatient follow-up is necessary for lab recheck as mentioned below. Reviewed CBC, CMP, BAL, and UDS; all unremarkable aside from noted exceptions.   Physical Findings: AIMS: Facial and Oral Movements Muscles of Facial Expression: None, normal Lips and Perioral Area: None, normal Jaw: None, normal Tongue: None, normal,Extremity Movements Upper (arms, wrists, hands, fingers): None, normal Lower (legs, knees, ankles, toes): None, normal, Trunk Movements Neck, shoulders, hips: None, normal, Overall Severity Severity of abnormal  movements (highest score from questions above): None, normal Incapacitation due to abnormal movements: None, normal Patient's awareness of abnormal movements (rate only patient's report): No Awareness, Dental Status Current problems with teeth and/or dentures?: No Does patient usually wear dentures?: No  CIWA:    COWS:     Musculoskeletal: Strength & Muscle Tone: within normal limits Gait & Station: normal Patient leans: N/A  Psychiatric Specialty Exam: Physical Exam  Vitals reviewed. Constitutional: He is oriented to person, place, and time. He appears well-developed.  Neurological: He is alert and oriented to person, place, and time.  Psychiatric: He has a normal mood and affect. His behavior is normal.    Review of Systems  Psychiatric/Behavioral: Negative for suicidal ideas. Depression: stable. The patient is not nervous/anxious.     Blood pressure 120/80, pulse 60, temperature 98.2 F (36.8 C), temperature source Oral, resp. rate 20, height 5\' 8"  (1.727 m), weight 62.1 kg (137 lb).Body mass index is 20.83 kg/m.  Have you used any form of tobacco in the last 30 days? (Cigarettes, Smokeless Tobacco, Cigars, and/or Pipes): Yes  Has this patient used any form of tobacco in the last 30 days? (Cigarettes, Smokeless Tobacco, Cigars, and/or Pipes) Yes, Yes, A prescription for an FDA-approved tobacco cessation  medication was offered at discharge and the patient refused  Blood Alcohol level:  Lab Results  Component Value Date   ETH <10 04/03/2017    Metabolic Disorder Labs:  Lab Results  Component Value Date   HGBA1C 5.2 04/05/2017   MPG 102.54 04/05/2017   MPG 94 09/11/2016   No results found for: PROLACTIN Lab Results  Component Value Date   CHOL 165 04/05/2017   TRIG 213 (H) 04/05/2017   HDL 44 04/05/2017   CHOLHDL 3.8 04/05/2017   VLDL 43 (H) 04/05/2017   LDLCALC 78 04/05/2017    See Psychiatric Specialty Exam and Suicide Risk Assessment completed by Attending  Physician prior to discharge.  Discharge destination:  Home  Is patient on multiple antipsychotic therapies at discharge:  No   Has Patient had three or more failed trials of antipsychotic monotherapy by history:  No  Recommended Plan for Multiple Antipsychotic Therapies: NA  Discharge Instructions    Diet - low sodium heart healthy   Complete by:  As directed    Discharge instructions   Complete by:  As directed    Take all medications as prescribed. Keep all follow-up appointments as scheduled.  Do not consume alcohol or use illegal drugs while on prescription medications. Report any adverse effects from your medications to your primary care provider promptly.  In the event of recurrent symptoms or worsening symptoms, call 911, a crisis hotline, or go to the nearest emergency department for evaluation.   Increase activity slowly   Complete by:  As directed      Allergies as of 04/07/2017   No Known Allergies     Medication List    STOP taking these medications   bacitracin ointment   indomethacin 50 MG capsule Commonly known as:  INDOCIN   naproxen 500 MG tablet Commonly known as:  NAPROSYN     TAKE these medications     Indication  nicotine polacrilex 2 MG gum Commonly known as:  NICORETTE Take 1 each (2 mg total) by mouth as needed for smoking cessation.  Indication:  Nicotine Addiction   QUEtiapine 100 MG tablet Commonly known as:  SEROQUEL Take 1 tablet (100 mg total) by mouth at bedtime.  Indication:  Depressive Phase of Manic-Depression   sertraline 25 MG tablet Commonly known as:  ZOLOFT Take 1 tablet (25 mg total) by mouth daily. Start taking on:  04/08/2017  Indication:  Major Depressive Disorder      Follow-up Information    [facility] Follow up on 04/10/2017.   Specialty:  Behavioral Health Why:  Hospital follow-up on Wednesday, 2/13 at 8:45AM. Please bring: photo ID, medicaid card, and hospital discharge paperwork. Thank you.  Contact  [address] [place] [address] [phone]           Follow-up recommendations:  Activity:  as tolerated Diet:  heart healthy  Comments:  Take all medications as prescribed. Keep all follow-up appointments as scheduled.  Do not consume alcohol or use illegal drugs while on prescription medications. Report any adverse effects from your medications to your primary care provider promptly.  In the event of recurrent symptoms or worsening symptoms, call 911, a crisis hotline, or go to the nearest emergency department for evaluation.   Signed: Oneta Rack, NP 04/07/2017, 10:48 AM    Patient seen, Suicide Assessment Completed.  Disposition Plan Reviewed

## 2017-04-07 NOTE — Progress Notes (Signed)
Patient ID: Chris Olson, male   DOB: 02/10/1991, 27 y.o.   MRN: [id number]    Pt was discharged home with a bus pass, no issues or concerns noted. Pt reported that his depression was a 0, his hopelessness was a 0, and his anxiety was a 0. Pt reported being negative SI/HI, no AH/VH noted. Pt was given all discharge paperwork.

## 2017-04-07 NOTE — Progress Notes (Signed)
  [facility] Adult Case Management Discharge Plan :  Will you be returning to the same living situation after discharge:  No.  Going to sleep on wife's couch.  Confirmed by CSW through phone call to wife Marisa SeverinKimberly Ortloff [phone]. At discharge, do you have transportation home?: Yes,  wife Do you have the ability to pay for your medications: Yes,  patient denied barriers  Release of information consent forms completed and turned in to Medical Records by CSW.  Patient to Follow up at: Follow-up Information    [facility] Follow up on 04/10/2017.   Specialty:  Behavioral Health Why:  Hospital follow-up on Wednesday, 2/13 at 8:45AM. Please bring: photo ID, medicaid card, and hospital discharge paperwork. Thank you.  Contact information: [address] [place] [phone]           Next level of care provider has access to [facility] Link:no  Safety Planning and Suicide Prevention discussed: Yes,  with father  Have you used any form of tobacco in the last 30 days? (Cigarettes, Smokeless Tobacco, Cigars, and/or Pipes): Yes  Has patient been referred to the Quitline?: Patient refused referral  Patient has been referred for addiction treatment: Yes  Lynnell ChadMareida J Grossman-Orr, LCSW 04/07/2017, 1:11 PM

## 2017-04-17 ENCOUNTER — Encounter (HOSPITAL_COMMUNITY): Payer: Self-pay

## 2017-04-17 ENCOUNTER — Emergency Department (HOSPITAL_COMMUNITY)
Admission: EM | Admit: 2017-04-17 | Discharge: 2017-04-18 | Disposition: A | Discharge status: Home / Self Care | Payer: Medicaid Other | Attending: Emergency Medicine | Admitting: Emergency Medicine

## 2017-04-17 DIAGNOSIS — Z79899 Other long term (current) drug therapy: Secondary | ICD-10-CM | POA: Diagnosis not present

## 2017-04-17 DIAGNOSIS — T438X1A Poisoning by other psychotropic drugs, accidental (unintentional), initial encounter: Secondary | ICD-10-CM | POA: Diagnosis not present

## 2017-04-17 DIAGNOSIS — R000 Tachycardia, unspecified: Secondary | ICD-10-CM | POA: Diagnosis not present

## 2017-04-17 DIAGNOSIS — F17210 Nicotine dependence, cigarettes, uncomplicated: Secondary | ICD-10-CM | POA: Diagnosis not present

## 2017-04-17 DIAGNOSIS — T50901A Poisoning by unspecified drugs, medicaments and biological substances, accidental (unintentional), initial encounter: Secondary | ICD-10-CM | POA: Diagnosis present

## 2017-04-17 DIAGNOSIS — T50992A Poisoning by other drugs, medicaments and biological substances, intentional self-harm, initial encounter: Secondary | ICD-10-CM | POA: Diagnosis present

## 2017-04-17 DIAGNOSIS — F4325 Adjustment disorder with mixed disturbance of emotions and conduct: Secondary | ICD-10-CM | POA: Diagnosis not present

## 2017-04-17 DIAGNOSIS — F1290 Cannabis use, unspecified, uncomplicated: Secondary | ICD-10-CM | POA: Diagnosis not present

## 2017-04-17 DIAGNOSIS — F329 Major depressive disorder, single episode, unspecified: Secondary | ICD-10-CM | POA: Insufficient documentation

## 2017-04-17 LAB — ETHANOL: Alcohol, Ethyl (B): <10 mg/dL (ref <10)

## 2017-04-17 LAB — COMPREHENSIVE METABOLIC PANEL
ALBUMIN: 4.5 g/dL (ref 3.5–5.0)
ALT: 19 U/L (ref 17–63)
ANION GAP: 10 (ref 5–15)
AST: 24 U/L (ref 15–41)
Alkaline Phosphatase: 56 U/L (ref 38–126)
BUN: 14 mg/dL (ref 6–20)
CHLORIDE: 101 mmol/L (ref 101–111)
CO2: 22 mmol/L (ref 22–32)
CREATININE: 0.89 mg/dL (ref 0.61–1.24)
Calcium: 8.9 mg/dL (ref 8.9–10.3)
GFR calc non Af Amer: >60 mL/min (ref >60)
Glucose, Bld: 133 mg/dL — ABNORMAL HIGH (ref 65–99)
Potassium: 3.6 mmol/L (ref 3.5–5.1)
SODIUM: 133 mmol/L — AB (ref 135–145)
Total Bilirubin: 0.9 mg/dL (ref 0.3–1.2)
Total Protein: 7.2 g/dL (ref 6.5–8.1)

## 2017-04-17 LAB — ACETAMINOPHEN LEVEL

## 2017-04-17 LAB — RAPID URINE DRUG SCREEN, HOSP PERFORMED
Amphetamines: NOT DETECTED
BARBITURATES: NOT DETECTED
Benzodiazepines: NOT DETECTED
COCAINE: NOT DETECTED
Opiates: NOT DETECTED
Tetrahydrocannabinol: NOT DETECTED

## 2017-04-17 LAB — SALICYLATE LEVEL

## 2017-04-17 MED ORDER — SODIUM CHLORIDE 0.9 % IV BOLUS (SEPSIS): 500.0000 mL | Freq: Once | INTRAVENOUS | Status: DC

## 2017-04-17 MED ORDER — NICOTINE POLACRILEX 2 MG MT GUM
2.0000 mg | CHEWING_GUM | OROMUCOSAL | Status: DC | PRN
  Administered 2017-04-17: 2 mg via ORAL
  Filled 2017-04-17: qty 1

## 2017-04-17 NOTE — ED Notes (Signed)
Patient's father Verneita GriffesLendell [phone]

## 2017-04-17 NOTE — ED Notes (Addendum)
I have just spoken with Rose at [facility] and she advised the following: 1 Monitor for CNS depression 2 Monitor for possibility of seizure(s) and if necessary they may be treated with benzodiazepines 3 Monitor for QTc prolongation 4 Monitor for tachycardia, and give IV fluids as necessary for this 5 Based on time of ingestion, obtain Tylenol level at 1300 hours 6 Recommend EKG and electrolytes 7 Recommend 6 hour observation time.

## 2017-04-17 NOTE — ED Notes (Signed)
SBAR Report received from previous nurse. Pt received calm and visible on unit. Pt denies current SI/ HI, A/V H,  anxiety, or pain at this time, and appears otherwise stable and free of distress. Pt rated depression 6/10 but has contracted for safety. Pt reminded of camera surveillance, q 15 min rounds, and rules of the milieu. Will continue to assess.

## 2017-04-17 NOTE — ED Notes (Signed)
[facility] nurse phones and I give her an update; they are pleased with pt's. Condition and results and are signing out of his case.
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
## 2017-04-17 NOTE — ED Notes (Signed)
Bed: RESA Expected date:  Expected time:  Means of arrival:  Comments: EMS/overdose 

## 2017-04-17 NOTE — ED Triage Notes (Signed)
He phoned EMS to report that he had taken ~20 each: 100mg  Quetiapine and 25mg  Sertraline tablets. He denies any intention of self-harm or suicide. He states he was "lonely and down and I just wanted to relax". He arrives here awake, alert and in no distress.

## 2017-04-17 NOTE — ED Provider Notes (Addendum)
[facility]-EMERGENCY DEPT Provider Note   CSN: [id number] Arrival date & time: 04/17/17  1108     History   Chief Complaint Chief Complaint  Patient presents with  . Drug Overdose    HPI Shoichi Difonzo is a 27 y.o. male. Level 5 caveat due to intoxication. HPI Patient presents with overdose.  Patient took between 10 and 20 of 100 mg Seroquel's and between 10 and 20 of 25 mill grams Zoloft.  Took them around 3 hours prior to arrival.  States it was not a suicide attempt but states he is very unhappy.  States he was doing it to get high and not have to deal with everything.  History of major depressive order.  He is somewhat sedated.  Difficult to get a history from.  They were his own medicines.  States he was occasionally drink some alcohol and has not been drinking today. Past Medical History:  Diagnosis Date  . Chest pain   . Pericarditis     Patient Active Problem List   Diagnosis Date Noted  . Paranoia (HCC) 04/04/2017  . MDD (major depressive disorder), recurrent severe, without psychosis (HCC) 04/03/2017  . Mood disorder (HCC) 09/15/2016    History reviewed. No pertinent surgical history.     Home Medications    Prior to Admission medications   Medication Sig Start Date End Date Taking? Authorizing Provider  QUEtiapine (SEROQUEL) 100 MG tablet Take 1 tablet (100 mg total) by mouth at bedtime. 04/07/17  Yes Oneta Rack, NP  sertraline (ZOLOFT) 25 MG tablet Take 1 tablet (25 mg total) by mouth daily. 04/08/17  Yes Oneta Rack, NP  nicotine polacrilex (NICORETTE) 2 MG gum Take 1 each (2 mg total) by mouth as needed for smoking cessation. Patient not taking: Reported on 04/17/2017 04/07/17   Oneta Rack, NP    Family History History reviewed. No pertinent family history.  Social History Social History   Tobacco Use  . Smoking status: Current Every Day Smoker    Types: Cigarettes  . Smokeless tobacco: Never Used  Substance Use  Topics  . Alcohol use: Yes  . Drug use: Yes    Types: Marijuana     Allergies   Patient has no known allergies.   Review of Systems Review of Systems  Unable to perform ROS: Mental status change     Physical Exam Updated Vital Signs BP 133/85 (BP Location: Left Arm)   Pulse 82   Temp 98.3 F (36.8 C) (Oral)   Resp 17   SpO2 99%   Physical Exam  Constitutional: He appears well-developed.  HENT:  Head: Atraumatic.  Eyes: Pupils are equal, round, and reactive to light.  Neck: Neck supple.  Cardiovascular:  Tachycardia  Pulmonary/Chest: Effort normal.  Abdominal: Soft.  Musculoskeletal: He exhibits no tenderness.  Neurological:  Patient is sedate.  Speech is somewhat slurred  Skin: Skin is warm.  Psychiatric:  Flat affect him to have slurred speech.     ED Treatments / Results  Labs (all labs ordered are listed, but only abnormal results are displayed) Labs Reviewed  COMPREHENSIVE METABOLIC PANEL - Abnormal; Notable for the following components:      Result Value   Sodium 133 (*)    Glucose, Bld 133 (*)    All other components within normal limits  ACETAMINOPHEN LEVEL - Abnormal; Notable for the following components:   Acetaminophen (Tylenol), Serum <10 (*)    All other components within normal limits  ETHANOL  SALICYLATE LEVEL  RAPID URINE DRUG SCREEN, HOSP PERFORMED    EKG  EKG Interpretation  Date/Time:  Wednesday April 17 2017 11:44:43 EST Ventricular Rate:  133 PR Interval:    QRS Duration: 73 QT Interval:  290 QTC Calculation: 432 R Axis:   90 Text Interpretation:  Sinus tachycardia LAE, consider biatrial enlargement Borderline right axis deviation Abnrm T, consider ischemia, anterolateral lds Borderline ST elevation, anterior leads lateral T wave changes are new Confirmed by Benjiman CorePickering, Aysha Livecchi [phone]([id number]) on 04/17/2017 11:47:52 AM       Radiology No results found.  Procedures Procedures (including critical care time)  Medications  Ordered in ED Medications  sodium chloride 0.9 % bolus 500 mL (500 mLs Intravenous Refused 04/17/17 1223)  nicotine polacrilex (NICORETTE) gum 2 mg (not administered)     Initial Impression / Assessment and Plan / ED Course  I have reviewed the triage vital signs and the nursing notes.  Pertinent labs & imaging results that were available during my care of the patient were reviewed by me and considered in my medical decision making (see chart for details).     Patient presents with intentional overdose of Seroquel and Zoloft.  Questionable self-harm intent.  At this point he appears to be medically cleared.  To be seen by TTS.  Patient's wife and father both think that he may need long patient treatment.  Recent inpatient treatment at behavioral health and overdosed again.  F Final Clinical Impressions(s) / ED Diagnoses   Final diagnoses:  Intentional drug overdose, initial encounter [facility](HCC)    ED Discharge Orders    None       Benjiman CorePickering, Alene Bergerson, MD 04/17/17 1606    Benjiman CorePickering, Berenize Gatlin, MD 04/17/17 [phone]

## 2017-04-18 DIAGNOSIS — T438X1A Poisoning by other psychotropic drugs, accidental (unintentional), initial encounter: Secondary | ICD-10-CM

## 2017-04-18 DIAGNOSIS — T50901A Poisoning by unspecified drugs, medicaments and biological substances, accidental (unintentional), initial encounter: Secondary | ICD-10-CM | POA: Diagnosis present

## 2017-04-18 DIAGNOSIS — F1290 Cannabis use, unspecified, uncomplicated: Secondary | ICD-10-CM | POA: Diagnosis not present

## 2017-04-18 DIAGNOSIS — F4325 Adjustment disorder with mixed disturbance of emotions and conduct: Secondary | ICD-10-CM

## 2017-04-18 DIAGNOSIS — F17210 Nicotine dependence, cigarettes, uncomplicated: Secondary | ICD-10-CM

## 2017-04-18 NOTE — Consult Note (Addendum)
[facility] Consult   Reason for Consult:  Overdose to get "high" Referring Physician:  EDP Patient Identification: Chris Olson MRN:  [id number] Principal Diagnosis: Accidental drug overdose Diagnosis:   Patient Active Problem List   Diagnosis Date Noted  . Adjustment disorder with mixed disturbance of emotions and conduct [F43.25] 04/18/2017    Priority: High    Total Time spent with patient: 45 minutes  Subjective:   Chris Olson is a 27 y.o. male patient does not warrant admission.  HPI: 27 yo male who presented to the ED after taking Seroquel to get "high", denies this was a suicide attempt.  No suicidal/homicidal ideations, hallucinations, or withdrawal symptoms.  His wife was contacted for collateral and she had no safety concerns.  He was on the phone prior to this with her and saying that he would be able to take their children to the bus stop tomorrow and that he loved her.  Explained to him, however, if he presented in the future with questionable overdose even though he may be trying to get high, he would be admitted.  At [facility] earlier this month for suicidal ideations and reports he does not feel that way at this time.  Past Psychiatric History: substance abuse, depression  Risk to Self: None Risk to Others: Homicidal Ideation: No Thoughts of Harm to Others: No Current Homicidal Intent: No Current Homicidal Plan: No Access to Homicidal Means: No History of harm to others?: No Assessment of Violence: None Noted Does patient have access to weapons?: No Criminal Charges Pending?: No Does patient have a court date: No Prior Inpatient Therapy: Prior Inpatient Therapy: Yes Prior Therapy Dates: Feb 2019; November 2018 Prior Therapy Facilty/Provider(s): [facility], Facility in [place] Reason for Treatment: Suicide Attempt; SI Prior Outpatient Therapy: Prior Outpatient Therapy: Yes Prior Therapy Dates: 2018 Prior Therapy Facilty/Provider(s): [facility] [place] Reason for  Treatment: depression, paranoioa Does patient have an ACCT team?: No Does patient have Intensive In-House Services?  : No Does patient have [facility] services? : No Does patient have P4[facility] services?: No  Past Medical History:  Past Medical History:  Diagnosis Date  . Chest pain   . Pericarditis    History reviewed. No pertinent surgical history. Family History: History reviewed. No pertinent family history. Family Psychiatric  History: none Social History:  Social History   Substance and Sexual Activity  Alcohol Use Yes   Comment: social use -- BAC was clear     Social History   Substance and Sexual Activity  Drug Use Yes  . Types: Marijuana   Comment: UDS was clear    Social History   Socioeconomic History  . Marital status: Single    Spouse name: None  . Number of children: None  . Years of education: None  . Highest education level: None  Social Needs  . Financial resource strain: None  . Food insecurity - worry: None  . Food insecurity - inability: None  . Transportation needs - medical: None  . Transportation needs - non-medical: None  Occupational History  . None  Tobacco Use  . Smoking status: Current Every Day Smoker    Types: Cigarettes  . Smokeless tobacco: Never Used  Substance and Sexual Activity  . Alcohol use: Yes    Comment: social use -- BAC was clear  . Drug use: Yes    Types: Marijuana    Comment: UDS was clear  . Sexual activity: Not Currently  Other Topics Concern  . None  Social History Narrative  .  None   Additional Social History: N/A    Allergies:  No Known Allergies  Labs:  Results for orders placed or performed during the hospital encounter of 04/17/17 (from the past 48 hour(s))  Comprehensive metabolic panel     Status: Abnormal   Collection Time: 04/17/17 11:51 AM  Result Value Ref Range   Sodium 133 (L) 135 - 145 mmol/L   Potassium 3.6 3.5 - 5.1 mmol/L   Chloride 101 101 - 111 mmol/L   CO2 22 22 - 32 mmol/L   Glucose,  Bld 133 (H) 65 - 99 mg/dL   BUN 14 6 - 20 mg/dL   Creatinine, Ser 0.89 0.61 - 1.24 mg/dL   Calcium 8.9 8.9 - 10.3 mg/dL   Total Protein 7.2 6.5 - 8.1 g/dL   Albumin 4.5 3.5 - 5.0 g/dL   AST 24 15 - 41 U/L   ALT 19 17 - 63 U/L   Alkaline Phosphatase 56 38 - 126 U/L   Total Bilirubin 0.9 0.3 - 1.2 mg/dL   GFR calc non Af Amer >60 >60 mL/min   GFR calc Af Amer >60 >60 mL/min    Comment: (NOTE) The eGFR has been calculated using the CKD EPI equation. This calculation has not been validated in all clinical situations. eGFR's persistently <60 mL/min signify possible Chronic Kidney Disease.    Anion gap 10 5 - 15    Comment: Performed at [facility], [place] [address] [place], [place] [address]  Ethanol     Status: None   Collection Time: 04/17/17 11:51 AM  Result Value Ref Range   Alcohol, Ethyl (B) <10 <10 mg/dL    Comment:        LOWEST DETECTABLE LIMIT FOR SERUM ALCOHOL IS 10 mg/dL FOR MEDICAL PURPOSES ONLY Performed at [facility], [place] [address] [place], [place] [address]   Salicylate level     Status: None   Collection Time: 04/17/17 11:51 AM  Result Value Ref Range   Salicylate Lvl <8.6 2.8 - 30.0 mg/dL    Comment: Performed at [facility], [place] [address], [place], [place] [address]  Acetaminophen level     Status: Abnormal   Collection Time: 04/17/17 11:51 AM  Result Value Ref Range   Acetaminophen (Tylenol), Serum <10 (L) 10 - 30 ug/mL    Comment:        THERAPEUTIC CONCENTRATIONS VARY SIGNIFICANTLY. A RANGE OF 10-30 ug/mL MAY BE AN EFFECTIVE CONCENTRATION FOR MANY PATIENTS. HOWEVER, SOME ARE BEST TREATED AT CONCENTRATIONS OUTSIDE THIS RANGE. ACETAMINOPHEN CONCENTRATIONS >150 ug/mL AT 4 HOURS AFTER INGESTION AND >50 ug/mL AT 12 HOURS AFTER INGESTION ARE OFTEN ASSOCIATED WITH TOXIC REACTIONS. Performed at [facility], [place] [address], [place], [place] [address]   Urine rapid drug  screen (hosp performed)     Status: None   Collection Time: 04/17/17 12:27 PM  Result Value Ref Range   Opiates NONE DETECTED NONE DETECTED   Cocaine NONE DETECTED NONE DETECTED   Benzodiazepines NONE DETECTED NONE DETECTED   Amphetamines NONE DETECTED NONE DETECTED   Tetrahydrocannabinol NONE DETECTED NONE DETECTED   Barbiturates NONE DETECTED NONE DETECTED    Comment: (NOTE) DRUG SCREEN FOR MEDICAL PURPOSES ONLY.  IF CONFIRMATION IS NEEDED FOR ANY PURPOSE, NOTIFY LAB WITHIN 5 DAYS. LOWEST DETECTABLE LIMITS FOR URINE DRUG SCREEN Drug Class                     Cutoff (ng/mL) Amphetamine  and metabolites    1000 Barbiturate and metabolites    200 Benzodiazepine                 202 Tricyclics and metabolites     300 Opiates and metabolites        300 Cocaine and metabolites        300 THC                            50 Performed at [facility], [place] [address] [place], [place] [address]     Current Facility-Administered Medications  Medication Dose Route Frequency Provider Last Rate Last Dose  . nicotine polacrilex (NICORETTE) gum 2 mg  2 mg Oral PRN Davonna Belling, MD   2 mg at 04/17/17 1639  . sodium chloride 0.9 % bolus 500 mL  500 mL Intravenous Once Davonna Belling, MD       Current Outpatient Medications  Medication Sig Dispense Refill  . QUEtiapine (SEROQUEL) 100 MG tablet Take 1 tablet (100 mg total) by mouth at bedtime. 30 tablet 0  . sertraline (ZOLOFT) 25 MG tablet Take 1 tablet (25 mg total) by mouth daily. 30 tablet 0  . nicotine polacrilex (NICORETTE) 2 MG gum Take 1 each (2 mg total) by mouth as needed for smoking cessation. (Patient not taking: Reported on 04/17/2017) 100 tablet 0    Musculoskeletal: Strength & Muscle Tone: within normal limits Gait & Station: normal Patient leans: N/A  Psychiatric Specialty Exam: Physical Exam  Nursing note and vitals reviewed. Constitutional: He is oriented to person, place, and time. He appears  well-developed and well-nourished.  HENT:  Head: Normocephalic and atraumatic.  Neck: Normal range of motion.  Respiratory: Effort normal.  Musculoskeletal: Normal range of motion.  Neurological: He is alert and oriented to person, place, and time.  Psychiatric: He has a normal mood and affect. His speech is normal and behavior is normal. Judgment and thought content normal. Cognition and memory are normal.    Review of Systems  Psychiatric/Behavioral: Negative for suicidal ideas.  All other systems reviewed and are negative.   Blood pressure 119/69, pulse 62, temperature 98.5 F (36.9 C), resp. rate 18, SpO2 99 %.There is no height or weight on file to calculate BMI.  General Appearance: Casual  Eye Contact:  Good  Speech:  Normal Rate  Volume:  Normal  Mood:  Euthymic  Affect:  Congruent  Thought Process:  Coherent and Descriptions of Associations: Intact  Orientation:  Full (Time, Place, and Person)  Thought Content:  WDL and Logical  Suicidal Thoughts:  No  Homicidal Thoughts:  No  Memory:  Immediate;   Good Recent;   Good Remote;   Good  Judgement:  Fair  Insight:  Fair  Psychomotor Activity:  Normal  Concentration:  Concentration: Good and Attention Span: Good  Recall:  Good  Fund of Knowledge:  Good  Language:  Good  Akathisia:  No  Handed:  Right  AIMS (if indicated):   N/A  Assets:  Housing Leisure Time Physical Health Resilience Social Support  ADL's:  Intact  Cognition:  WNL  Sleep:   N/A     Treatment Plan Summary: Daily contact with patient to assess and evaluate symptoms and progress in treatment, Medication management and Plan adjustment disorder with mixed disturbance of emotions and conduct:  -Crisis stabilization -Medication management:  None restarted given overdose.  -Individual counseling  Disposition: No evidence of  imminent risk to self or others at present.    Waylan Boga, NP 04/18/2017 11:23 AM   Patient seen face-to-face for  psychiatric evaluation, chart reviewed and case discussed with the physician extender and developed treatment plan. Reviewed the information documented and agree with the treatment plan.  Buford Dresser, DO 04/18/17 8:09 PM

## 2017-04-18 NOTE — ED Notes (Signed)
Patient coming out of room occasionally asking when he can go home.  Explained to patient that doctor would be around to see him soon.  Patient said, "I need to pick up my kids."

## 2017-04-18 NOTE — Discharge Instructions (Signed)
For your mental health needs, you are advised to follow up with [facility].  New and returning patients are seen at their walk-in clinic.  Walk-in hours are Monday - Friday from 8:00 am - 3:00 pm.  Walk-in patients are seen on a first come, first served basis.  Try to arrive as early as possible for he best chance of being seen the same day: ° °     [facility] °     [address] °     [place], [place] [address] °     [phone] °

## 2017-04-18 NOTE — BH Assessment (Signed)
[facility] Assessment Progress Note  Per Jacqueline Norman, DO, this pt does not require psychiatric hospitalization at this time.  Pt is to be discharged from [facility] with recommendation to follow up with [facility].  This has been included in pt's discharge instructions.  Pt's nurse, Cynthia, has been notified.  Chris Mahler, MA Triage Specialist [phone]     

## 2017-04-21 ENCOUNTER — Emergency Department (HOSPITAL_COMMUNITY)
Admission: EM | Admit: 2017-04-21 | Discharge: 2017-04-22 | Disposition: A | Discharge status: Other Acute Inpatient Hospital | Payer: Medicaid Other | Attending: Emergency Medicine | Admitting: Emergency Medicine

## 2017-04-21 ENCOUNTER — Encounter (HOSPITAL_COMMUNITY): Payer: Self-pay

## 2017-04-21 DIAGNOSIS — F17210 Nicotine dependence, cigarettes, uncomplicated: Secondary | ICD-10-CM | POA: Diagnosis not present

## 2017-04-21 DIAGNOSIS — T50902A Poisoning by unspecified drugs, medicaments and biological substances, intentional self-harm, initial encounter: Secondary | ICD-10-CM | POA: Diagnosis not present

## 2017-04-21 DIAGNOSIS — F332 Major depressive disorder, recurrent severe without psychotic features: Secondary | ICD-10-CM | POA: Insufficient documentation

## 2017-04-21 DIAGNOSIS — R45851 Suicidal ideations: Secondary | ICD-10-CM | POA: Insufficient documentation

## 2017-04-21 DIAGNOSIS — Z79899 Other long term (current) drug therapy: Secondary | ICD-10-CM | POA: Diagnosis not present

## 2017-04-21 LAB — COMPREHENSIVE METABOLIC PANEL
ALK PHOS: 50 U/L (ref 38–126)
ALT: 15 U/L — ABNORMAL LOW (ref 17–63)
ANION GAP: 15 (ref 5–15)
AST: 22 U/L (ref 15–41)
Albumin: 4.4 g/dL (ref 3.5–5.0)
BUN: 8 mg/dL (ref 6–20)
CALCIUM: 9.3 mg/dL (ref 8.9–10.3)
CO2: 21 mmol/L — AB (ref 22–32)
CREATININE: 0.97 mg/dL (ref 0.61–1.24)
Chloride: 104 mmol/L (ref 101–111)
Glucose, Bld: 102 mg/dL — ABNORMAL HIGH (ref 65–99)
Potassium: 3.4 mmol/L — ABNORMAL LOW (ref 3.5–5.1)
SODIUM: 140 mmol/L (ref 135–145)
Total Bilirubin: 0.6 mg/dL (ref 0.3–1.2)
Total Protein: 6.9 g/dL (ref 6.5–8.1)

## 2017-04-21 LAB — CBC
HCT: 43.5 % (ref 39.0–52.0)
HEMOGLOBIN: 15.3 g/dL (ref 13.0–17.0)
MCH: 33.2 pg (ref 26.0–34.0)
MCHC: 35.2 g/dL (ref 30.0–36.0)
MCV: 94.4 fL (ref 78.0–100.0)
PLATELETS: 144 10*3/uL — AB (ref 150–400)
RBC: 4.61 MIL/uL (ref 4.22–5.81)
RDW: 11.9 % (ref 11.5–15.5)
WBC: 9.8 10*3/uL (ref 4.0–10.5)

## 2017-04-21 LAB — RAPID URINE DRUG SCREEN, HOSP PERFORMED
Amphetamines: NOT DETECTED
Barbiturates: NOT DETECTED
Benzodiazepines: NOT DETECTED
COCAINE: NOT DETECTED
OPIATES: NOT DETECTED
TETRAHYDROCANNABINOL: POSITIVE — AB

## 2017-04-21 LAB — ETHANOL: ALCOHOL ETHYL (B): 103 mg/dL — AB (ref <10)

## 2017-04-21 LAB — ACETAMINOPHEN LEVEL

## 2017-04-21 LAB — SALICYLATE LEVEL

## 2017-04-21 MED ORDER — POTASSIUM CHLORIDE CRYS ER 20 MEQ PO TBCR
40.0000 meq | EXTENDED_RELEASE_TABLET | Freq: Once | ORAL | Status: AC
  Administered 2017-04-22: 40 meq via ORAL
  Filled 2017-04-21: qty 2

## 2017-04-21 NOTE — ED Notes (Signed)
Safety sitter requested @ 2146, none coming.  Checked and camera-safety-sitter not appropriate.

## 2017-04-21 NOTE — ED Notes (Signed)
Paged [place] poison control to [employer] @ [phone]

## 2017-04-21 NOTE — ED Notes (Signed)
Suicide sitter is needed, not safety.

## 2017-04-21 NOTE — ED Triage Notes (Signed)
Reports drinking 2 40oz beers and liquor

## 2017-04-21 NOTE — ED Triage Notes (Signed)
Pt reports that he took 14 trazadone in a attempt to kill himself. Took overdose 3 days ago also. Denies HI/AVH. Vomited x 1 Bp 64/49 HR 49

## 2017-04-21 NOTE — ED Provider Notes (Addendum)
[facility]   CSN: [id number] Arrival date & time: 04/21/17  2136     History   Chief Complaint Chief Complaint  Patient presents with  . Suicidal  . Ingestion    HPI Konor Erdahl is a 27 y.o. male.  5 caveat psychiatric complaint.  Patient overdosed on 14 trazodone tablets 1 or 2 hours prior to admission he does not know whether trazodone tablets were 100 mg or 50 mg each.  He also drank 240 ounce beers.  He did this intent to harm himself as he is depressed over his recent divorce.  Presently feels drowsy.  Denies other complaint.  HPI  Past Medical History:  Diagnosis Date  . Chest pain   . Pericarditis   Schizophrenia  Patient Active Problem List   Diagnosis Date Noted  . 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 Accidental drug overdose   Seen here 04/17/2017 for overdose of Seroquel and Zoloft.  History reviewed. No pertinent surgical history.     Home Medications    Prior to Admission medications   Medication Sig Start Date End Date Taking? Authorizing Provider  QUEtiapine (SEROQUEL) 100 MG tablet Take 1 tablet (100 mg total) by mouth at bedtime. 04/07/17  Yes Oneta RackLewis, Tanika N, NP  sertraline (ZOLOFT) 25 MG tablet Take 1 tablet (25 mg total) by mouth daily. 04/08/17  Yes Oneta RackLewis, Tanika N, NP  nicotine polacrilex (NICORETTE) 2 MG gum Take 1 each (2 mg total) by mouth as needed for smoking cessation. Patient not taking: Reported on 04/17/2017 04/07/17   Oneta RackLewis, Tanika N, NP    Family History No family history on file.  Social History Social History   Tobacco Use  . Smoking status: Current Every Day Smoker    Types: Cigarettes  . Smokeless tobacco: Never Used  Substance Use Topics  . Alcohol use: Yes    Comment: social use -- BAC was clear  . Drug use: Yes    Types: Marijuana    Comment: UDS was clear     Allergies   Patient has no known  allergies.   Review of Systems Review of Systems  Unable to perform ROS: Psychiatric disorder  Psychiatric/Behavioral: Positive for suicidal ideas.     Physical Exam Updated Vital Signs BP 118/76   Pulse 61   Temp 97.6 F (36.4 C) (Oral)   Resp 20   SpO2 98%   Physical Exam  Constitutional: He appears well-developed and well-nourished.  HENT:  Head: Normocephalic and atraumatic.  Eyes: Conjunctivae are normal. Pupils are equal, round, and reactive to light.  Neck: Neck supple. No tracheal deviation present. No thyromegaly present.  Cardiovascular: Normal rate and regular rhythm.  No murmur heard. Pulmonary/Chest: Effort normal and breath sounds normal.  Abdominal: Soft. Bowel sounds are normal. He exhibits no distension. There is no tenderness.  Musculoskeletal: Normal range of motion. He exhibits no edema or tenderness.  Neurological: He is alert. Coordination normal.  Osco coma score 14.  Speech slightly slurred.  Cranial nerves II through XII intact moves all extremities well.  Skin: Skin is warm and dry. No rash noted.  Psychiatric: He has a normal mood and affect.  Nursing note and vitals reviewed.    ED Treatments / Results  Labs (all labs ordered are listed, but only abnormal results are displayed) Labs Reviewed  CBC - Abnormal; Notable for the following components:      Result Value  Platelets 144 (*)    All other components within normal limits  COMPREHENSIVE METABOLIC PANEL  ETHANOL  SALICYLATE LEVEL  ACETAMINOPHEN LEVEL  RAPID URINE DRUG SCREEN, HOSP PERFORMED  ACETAMINOPHEN LEVEL    EKG  EKG Interpretation  Date/Time:  Sunday April 21 2017 21:48:57 EST Ventricular Rate:  71 PR Interval:  130 QRS Duration: 110 QT Interval:  442 QTC Calculation: 480 R Axis:   101 Text Interpretation:  Normal sinus rhythm Rightward axis ST elevation consider anterior injury or acute infarct Prolonged QT Abnormal ECG early repolarization Since last tracing  rate slower Confirmed by Doug Sou [phone]) on 04/21/2017 10:07:04 PM      Results for orders placed or performed during the hospital encounter of 04/21/17  Comprehensive metabolic panel  Result Value Ref Range   Sodium 140 135 - 145 mmol/L   Potassium 3.4 (L) 3.5 - 5.1 mmol/L   Chloride 104 101 - 111 mmol/L   CO2 21 (L) 22 - 32 mmol/L   Glucose, Bld 102 (H) 65 - 99 mg/dL   BUN 8 6 - 20 mg/dL   Creatinine, Ser 7.82 0.61 - 1.24 mg/dL   Calcium 9.3 8.9 - 95.6 mg/dL   Total Protein 6.9 6.5 - 8.1 g/dL   Albumin 4.4 3.5 - 5.0 g/dL   AST 22 15 - 41 U/L   ALT 15 (L) 17 - 63 U/L   Alkaline Phosphatase 50 38 - 126 U/L   Total Bilirubin 0.6 0.3 - 1.2 mg/dL   GFR calc non Af Amer >60 >60 mL/min   GFR calc Af Amer >60 >60 mL/min   Anion gap 15 5 - 15  Ethanol  Result Value Ref Range   Alcohol, Ethyl (B) 103 (H) <10 mg/dL  Salicylate level  Result Value Ref Range   Salicylate Lvl <7.0 2.8 - 30.0 mg/dL  Acetaminophen level  Result Value Ref Range   Acetaminophen (Tylenol), Serum <10 (L) 10 - 30 ug/mL  cbc  Result Value Ref Range   WBC 9.8 4.0 - 10.5 K/uL   RBC 4.61 4.22 - 5.81 MIL/uL   Hemoglobin 15.3 13.0 - 17.0 g/dL   HCT 21.3 08.6 - 57.8 %   MCV 94.4 78.0 - 100.0 fL   MCH 33.2 26.0 - 34.0 pg   MCHC 35.2 30.0 - 36.0 g/dL   RDW 46.9 62.9 - 52.8 %   Platelets 144 (L) 150 - 400 K/uL  Rapid urine drug screen (hospital performed)  Result Value Ref Range   Opiates NONE DETECTED NONE DETECTED   Cocaine NONE DETECTED NONE DETECTED   Benzodiazepines NONE DETECTED NONE DETECTED   Amphetamines NONE DETECTED NONE DETECTED   Tetrahydrocannabinol POSITIVE (A) NONE DETECTED   Barbiturates NONE DETECTED NONE DETECTED   No results found. Radiology No results found.  Procedures Procedures (including critical care time)  Medications Ordered in ED Medications - No data to display   Initial Impression / Assessment and Plan / ED Course  I have reviewed the triage vital signs and  the nursing notes.  Pertinent labs & imaging results that were available during my care of the patient were reviewed by me and considered in my medical decision making (see chart for details). Results for orders placed or performed during the hospital encounter of 04/21/17  Comprehensive metabolic panel  Result Value Ref Range   Sodium 140 135 - 145 mmol/L   Potassium 3.4 (L) 3.5 - 5.1 mmol/L   Chloride 104 101 - 111 mmol/L   CO2  21 (L) 22 - 32 mmol/L   Glucose, Bld 102 (H) 65 - 99 mg/dL   BUN 8 6 - 20 mg/dL   Creatinine, Ser 8.29 0.61 - 1.24 mg/dL   Calcium 9.3 8.9 - 56.2 mg/dL   Total Protein 6.9 6.5 - 8.1 g/dL   Albumin 4.4 3.5 - 5.0 g/dL   AST 22 15 - 41 U/L   ALT 15 (L) 17 - 63 U/L   Alkaline Phosphatase 50 38 - 126 U/L   Total Bilirubin 0.6 0.3 - 1.2 mg/dL   GFR calc non Af Amer >60 >60 mL/min   GFR calc Af Amer >60 >60 mL/min   Anion gap 15 5 - 15  Ethanol  Result Value Ref Range   Alcohol, Ethyl (B) 103 (H) <10 mg/dL  Salicylate level  Result Value Ref Range   Salicylate Lvl <7.0 2.8 - 30.0 mg/dL  Acetaminophen level  Result Value Ref Range   Acetaminophen (Tylenol), Serum <10 (L) 10 - 30 ug/mL  cbc  Result Value Ref Range   WBC 9.8 4.0 - 10.5 K/uL   RBC 4.61 4.22 - 5.81 MIL/uL   Hemoglobin 15.3 13.0 - 17.0 g/dL   HCT 13.0 86.5 - 78.4 %   MCV 94.4 78.0 - 100.0 fL   MCH 33.2 26.0 - 34.0 pg   MCHC 35.2 30.0 - 36.0 g/dL   RDW 69.6 29.5 - 28.4 %   Platelets 144 (L) 150 - 400 K/uL  Rapid urine drug screen (hospital performed)  Result Value Ref Range   Opiates NONE DETECTED NONE DETECTED   Cocaine NONE DETECTED NONE DETECTED   Benzodiazepines NONE DETECTED NONE DETECTED   Amphetamines NONE DETECTED NONE DETECTED   Tetrahydrocannabinol POSITIVE (A) NONE DETECTED   Barbiturates NONE DETECTED NONE DETECTED  Acetaminophen level  Result Value Ref Range   Acetaminophen (Tylenol), Serum <10 (L) 10 - 30 ug/mL   No results found. Clinical Course as of Apr 22 1505   Mon Apr 22, 2017  0205 Acetaminophen (Tylenol), S: (!) <10 [JT]    Clinical Course User Index [JT] Hillery Aldo, [place] Loraine Leriche), [employer] center consulted.  Suggest observation for4-6 hours post ingestion.  4-hour acetaminophen level LFTs, cardiac monitoring.  Watch for widening QRS, and long QT.  Monitor for hypotension Patient should have repeat urine urinary EKG for   11:40 PM patient is resting comfortably.  Alert Glasgow Coma Score 15.  States he feels okay.  Normal sinus rhythm on cardiac monitor.  QRS appears normal on heart monitor  Remains hemodynamically stable.  Lab work consistent with mild hypokalemia and alcohol intoxication as well as cannabis abuse.  Oral potassium supplementation ordered. Patient signed out to Dr.Delo 11:45 AM. Final Clinical Impressions(s) / ED Diagnoses   #1 intentional drug overdose Final diagnoses:  None   #2 suicide attempt #3 polysubstance abuse ED Discharge Orders    None    CRITICAL CARE Performed by: Doug Sou Total critical care time: 30 minutes Critical care time was exclusive of separately billable procedures and treating other patients. Critical care was necessary to treat or prevent imminent or life-threatening deterioration. Critical care was time spent personally by me on the following activities: development of treatment plan with patient and/or surrogate as well as nursing, discussions with consultants, evaluation of patient's response to treatment, examination of patient, obtaining history from patient or surrogate, ordering and performing treatments and interventions, ordering and review of laboratory studies, ordering and review of radiographic studies, pulse  oximetry and re-evaluation of patient's condition.   Doug Sou, MD 04/21/17 2350    Doug Sou, MD 04/22/17 [id number]

## 2017-04-22 ENCOUNTER — Encounter (HOSPITAL_COMMUNITY): Payer: Self-pay

## 2017-04-22 ENCOUNTER — Inpatient Hospital Stay (HOSPITAL_COMMUNITY)
Admission: AD | Admit: 2017-04-22 | Discharge: 2017-04-25 | DRG: 885 | Disposition: A | Discharge status: Home / Self Care | Payer: Medicaid Other | Source: Intra-hospital | Attending: Psychiatry | Admitting: Psychiatry

## 2017-04-22 ENCOUNTER — Other Ambulatory Visit: Payer: Self-pay

## 2017-04-22 DIAGNOSIS — F419 Anxiety disorder, unspecified: Secondary | ICD-10-CM | POA: Diagnosis not present

## 2017-04-22 DIAGNOSIS — T50902A Poisoning by unspecified drugs, medicaments and biological substances, intentional self-harm, initial encounter: Secondary | ICD-10-CM | POA: Diagnosis not present

## 2017-04-22 DIAGNOSIS — F17210 Nicotine dependence, cigarettes, uncomplicated: Secondary | ICD-10-CM | POA: Diagnosis not present

## 2017-04-22 DIAGNOSIS — Z915 Personal history of self-harm: Secondary | ICD-10-CM | POA: Diagnosis not present

## 2017-04-22 DIAGNOSIS — Z818 Family history of other mental and behavioral disorders: Secondary | ICD-10-CM | POA: Diagnosis not present

## 2017-04-22 DIAGNOSIS — F332 Major depressive disorder, recurrent severe without psychotic features: Principal | ICD-10-CM | POA: Diagnosis present

## 2017-04-22 DIAGNOSIS — R450 Nervousness: Secondary | ICD-10-CM | POA: Diagnosis not present

## 2017-04-22 DIAGNOSIS — F1290 Cannabis use, unspecified, uncomplicated: Secondary | ICD-10-CM | POA: Diagnosis not present

## 2017-04-22 DIAGNOSIS — T43212A Poisoning by selective serotonin and norepinephrine reuptake inhibitors, intentional self-harm, initial encounter: Secondary | ICD-10-CM | POA: Diagnosis not present

## 2017-04-22 DIAGNOSIS — T1491XA Suicide attempt, initial encounter: Secondary | ICD-10-CM | POA: Diagnosis not present

## 2017-04-22 DIAGNOSIS — G4700 Insomnia, unspecified: Secondary | ICD-10-CM | POA: Diagnosis not present

## 2017-04-22 DIAGNOSIS — F1010 Alcohol abuse, uncomplicated: Secondary | ICD-10-CM | POA: Diagnosis not present

## 2017-04-22 DIAGNOSIS — T43211A Poisoning by selective serotonin and norepinephrine reuptake inhibitors, accidental (unintentional), initial encounter: Secondary | ICD-10-CM | POA: Diagnosis present

## 2017-04-22 DIAGNOSIS — Y905 Blood alcohol level of 100-119 mg/100 ml: Secondary | ICD-10-CM | POA: Diagnosis not present

## 2017-04-22 DIAGNOSIS — T510X2A Toxic effect of ethanol, intentional self-harm, initial encounter: Secondary | ICD-10-CM | POA: Diagnosis not present

## 2017-04-22 LAB — ACETAMINOPHEN LEVEL

## 2017-04-22 MED ORDER — ALUM & MAG HYDROXIDE-SIMETH 200-200-20 MG/5ML PO SUSP: 30.0000 mL | ORAL | Status: DC | PRN

## 2017-04-22 MED ORDER — TRAZODONE HCL 50 MG PO TABS: 50.0000 mg | ORAL_TABLET | Freq: Every evening | ORAL | Status: DC | PRN

## 2017-04-22 MED ORDER — ACETAMINOPHEN 325 MG PO TABS: 650.0000 mg | ORAL_TABLET | Freq: Four times a day (QID) | ORAL | Status: DC | PRN

## 2017-04-22 MED ORDER — MAGNESIUM HYDROXIDE 400 MG/5ML PO SUSP
30.0000 mL | Freq: Every day | ORAL | Status: DC | PRN
Start: 2017-04-22 — End: 2017-04-25

## 2017-04-22 MED ORDER — QUETIAPINE FUMARATE 100 MG PO TABS
100.0000 mg | ORAL_TABLET | Freq: Every evening | ORAL | Status: DC | PRN
  Administered 2017-04-22: 100 mg via ORAL
  Filled 2017-04-22 (×7): qty 1

## 2017-04-22 MED ORDER — NICOTINE POLACRILEX 2 MG MT GUM
2.0000 mg | CHEWING_GUM | OROMUCOSAL | Status: DC | PRN
  Administered 2017-04-23 – 2017-04-24 (×4): 2 mg via ORAL
  Filled 2017-04-22 (×2): qty 1

## 2017-04-22 NOTE — ED Notes (Signed)
Pt belongings inventoried and placed in locker 5, valuables placed with security, pt wand by security at this time.

## 2017-04-22 NOTE — BH Assessment (Signed)
Pt's nurse Magda PaganiniAudrey phoned to report the pt wanted to go home. This Clinical research associatewriter touched base with AC and TTS recommendation is still that pt meets inpatient criteria due to serious attempt to over dose and end his life.

## 2017-04-22 NOTE — Progress Notes (Signed)
Pt attended AA group this evening.  

## 2017-04-22 NOTE — Progress Notes (Signed)
TTS consulted with Nira ConnJason Berry, NP who recommends inpt treatment. EDP Dr Judd Lienelo, MD and pt's nurse Koleen NimrodAdrian, RN have been advised of the disposition. TTS to seek placement. [facility]  Princess BruinsAquicha Niamh Rada, MSW, LCSW Therapeutic Triage Specialist  [phone]

## 2017-04-22 NOTE — ED Triage Notes (Signed)
TC from Poison Control for follow up on Pt condition. Vital signs provided . Pt reported a Friend gave him the meds he overdosed on.

## 2017-04-22 NOTE — Progress Notes (Signed)
Chris Olson is a 27 year old male being admitted voluntarily to [facility] from [facility].  He came in after ingesting 14 trazodone pills in a suicide attempt.  [facility]  [facility] he was pleasant and cooperative.  Affect sad.  He denies any SI/HI or A/V hallucinations.  He continues to report feeling hopeless, helpless and worthless.  He continues to report his stressors as his wife wanting a divorce and life stressors.  He reported after he left [facility] earlier this month, he wasn't able to follow up with [facility].  "I didn't have a ride there."  Oriented him to the unit.  Admission paperwork completed and signed.  Belongings searched and secured in locker # 37, no contraband found.  Skin assessment completed and no skin issues ntoed.  Q 15 minute checks initiated for safety.  We will continue to monitor the progress towards his goals.

## 2017-04-22 NOTE — Progress Notes (Signed)
Pt is pre-accepted to bed 306-1 Nira ConnJason Berry, NP is the accepting provider.  Dr. Jama Flavorsobos is the attending provider.  Call report to [phone]  [email or web address] notified.   Pt is Voluntary.  [facility], Lanny CrampLinsey S, RN, to notify when bed becomes available.  Timmothy EulerJean T. Kaylyn LimSutter, MSW, LCSWA Disposition Clinical Social Work [phone] (cell) [phone] (office)

## 2017-04-22 NOTE — BH Assessment (Signed)
Reassessment-   Pt was awakened for reassessment. Pt seemed "groggy". Pt reports he slept "a little bit" and had not eaten breakfast but had an appetite. Pt attempted drug overdose last night but denies SI this AM stating " I'm OK". Pt reports he lives with his father. This Clinical research associatewriter inquired about pt contracting for safety through his Father and the pt reported "if he's not working". Per Fransisca KaufmannLaura Davis, NP pt continues to meet inpatient criteria.

## 2017-04-22 NOTE — Tx Team (Signed)
Initial Treatment Plan 04/22/2017 4:32 PM Chris Olson [id number]    PATIENT STRESSORS: Financial difficulties Marital or family conflict Substance abuse   PATIENT STRENGTHS: Wellsite geologistCommunication skills General fund of knowledge Motivation for treatment/growth Physical Health   PATIENT IDENTIFIED PROBLEMS: Depression  Suicidal ideation  "I need help with my depression"  "Stop beating myself up over the things I can't fix"               DISCHARGE CRITERIA:  Improved stabilization in mood, thinking, and/or behavior Verbal commitment to aftercare and medication compliance Withdrawal symptoms are absent or subacute and managed without 24-hour nursing intervention  PRELIMINARY DISCHARGE PLAN: Outpatient therapy Medication management  PATIENT/FAMILY INVOLVEMENT: This treatment plan has been presented to and reviewed with the patient, Chris Olson.  The patient and family have been given the opportunity to ask questions and make suggestions.  Levin BaconHeather V Layla Kesling, RN 04/22/2017, 4:32 PM

## 2017-04-22 NOTE — BH Assessment (Addendum)
Tele Assessment Note   Patient Name: Chris Olson MRN: [id number] Referring Physician: Doug SouJacubowitz, Sam, MD Location of [facility]: [facility] Location of Provider: Behavioral Health TTS Department  Chris Olson is an 27 y.o. male who presents to the ED voluntarily. Pt intentionally ingested 14 Trazodone tablets in a suicide attempt. Pt states he has attempted suicide in the past and [facility] due to a suicide attempt on 04/03/17 and later d/c on 04/07/17. Pt returned to the ED and was evaluated by TTS on 04/17/17 due to intentionally overdosing on 10 to 20 tabs of Seroquel and 20 tabs of Zoloft. Pt was initially recommended for inpt admission due to the OD, however was later d/c on 04/18/17 due to the pt denying that he was suicidal at that time. Pt states his wife wants a divorce which is causing him to feel depressed. Pt states he does not want a divorce and he has "been doing everything in his power to get his family back." Pt states he feels helpless and hopeless like he cannot do anything right. Pt states he has been living with his father since the separation. Pt states he is not working and receives SSI due to a learning disability he's had since he was a child. Pt reports [facility] but states he missed his last appointment and has not seen a provider "in a while."   Pt was asked if he experiences HI and he paused for several seconds and stated "no." Pt denies AVH but states he has constant racing thoughts which sometimes keep him awake for several days at a time.   TTS consulted with Nira ConnJason Berry, NP who recommends inpt treatment. EDP Dr Judd Lienelo, MD and pt's nurse Koleen NimrodAdrian, RN have been advised of the disposition. TTS to seek placement. [facility]  Diagnosis: Major depressive disorder, Recurrent episode, Severe Cannabis Use disorder, severe; Alcohol Use disorder, moderate Tobacco use disorder, Severe  Past Medical  History:  Past Medical History:  Diagnosis Date  . Chest pain   . Pericarditis     History reviewed. No pertinent surgical history.  Family History: No family history on file.  Social History:  reports that he has been smoking cigarettes.  he has never used smokeless tobacco. He reports that he drinks alcohol. He reports that he uses drugs. Drug: Marijuana.  Additional Social History:  Alcohol / Drug Use Pain Medications: See MAR Prescriptions: See MAR Over the Counter: See MAR History of alcohol / drug use?: Yes Substance #1 Name of Substance 1: Cannabis 1 - Age of First Use: 15 1 - Amount (size/oz): 1 blunt 1 - Frequency: varies 1 - Duration: ongoing 1 - Last Use / Amount: 04/21/17 Substance #2 Name of Substance 2: Alcohol 2 - Age of First Use: 15 2 - Amount (size/oz): varies 2 - Frequency: 3x/week 2 - Duration: ongoing 2 - Last Use / Amount: 04/21/17 Substance #3 Name of Substance 3: Tobacco 3 - Age of First Use: 15 3 - Amount (size/oz): 1 pack 3 - Frequency: daily 3 - Duration: ongoing 3 - Last Use / Amount: 04/21/17  CIWA: CIWA-Ar BP: 123/85 Pulse Rate: 96 COWS:    Allergies: No Known Allergies  Home Medications:  (Not in a hospital admission)  OB/GYN Status:  No LMP for male patient.  General Assessment Data [facility] TTS Assessment: In system Is this a Tele or Face-to-Face Assessment?: Tele Assessment Is this  an Initial Assessment or a Re-assessment for this encounter?: Initial Assessment Marital status: Separated Is patient pregnant?: No Pregnancy Status: No Living Arrangements: Parent Can pt return to current living arrangement?: Yes Admission Status: Voluntary Is patient capable of signing voluntary admission?: Yes Referral Source: Self/Family/Friend Insurance type: Medicaid     Crisis Care Plan Living Arrangements: Parent Name of Psychiatrist: Transport planner Name of Therapist: [facility]   Education Status Is patient  currently in school?: No Highest grade of school patient has completed: 12th  Risk to self with the past 6 months Suicidal Ideation: Yes-Currently Present Has patient been a risk to self within the past 6 months prior to admission? : Yes Suicidal Intent: Yes-Currently Present Has patient had any suicidal intent within the past 6 months prior to admission? : Yes Is patient at risk for suicide?: Yes Suicidal Plan?: Yes-Currently Present Has patient had any suicidal plan within the past 6 months prior to admission? : Yes Specify Current Suicidal Plan: pt intentionally ingested 14 Trazodone pills in a suicide attempt  Access to Means: Yes Specify Access to Suicidal Means: pt has access to medication  What has been your use of drugs/alcohol within the last 12 months?: reports to alcohol, cannabis and tobacco use  Previous Attempts/Gestures: Yes How many times?: 3 Triggers for Past Attempts: Spouse contact Intentional Self Injurious Behavior: None Family Suicide History: No Recent stressful life event(s): Conflict (Comment), Divorce, Other (Comment)(wife wants divorce) Persecutory voices/beliefs?: No Depression: Yes Depression Symptoms: Despondent, Insomnia, Tearfulness, Isolating, Fatigue, Guilt, Feeling worthless/self pity, Loss of interest in usual pleasures, Feeling angry/irritable Substance abuse history and/or treatment for substance abuse?: Yes Suicide prevention information given to non-admitted patients: Not applicable  Risk to Others within the past 6 months Homicidal Ideation: No Does patient have any lifetime risk of violence toward others beyond the six months prior to admission? : No Thoughts of Harm to Others: No Current Homicidal Intent: No Current Homicidal Plan: No Access to Homicidal Means: No History of harm to others?: No Assessment of Violence: None Noted Does patient have access to weapons?: No Criminal Charges Pending?: No Does patient have a court date: No Is  patient on probation?: No  Psychosis Hallucinations: None noted Delusions: None noted  Mental Status Report Appearance/Hygiene: In hospital gown Eye Contact: Good Motor Activity: Unremarkable Speech: Soft, Slow Level of Consciousness: Drowsy Mood: Depressed, Despair, Helpless, Sad, Sullen, Worthless, low self-esteem Affect: Depressed, Sad, Appropriate to circumstance Anxiety Level: None Thought Processes: Relevant, Coherent Judgement: Impaired Orientation: Person, Place, Time, Situation, Appropriate for developmental age Obsessive Compulsive Thoughts/Behaviors: None  Cognitive Functioning Concentration: Normal Memory: Remote Intact, Recent Intact IQ: Average Insight: Poor Impulse Control: Poor Appetite: Fair Sleep: Decreased Total Hours of Sleep: 2 Vegetative Symptoms: None  ADLScreening [facility] Services) Patient's cognitive ability adequate to safely complete daily activities?: Yes Patient able to express need for assistance with ADLs?: Yes Independently performs ADLs?: Yes (appropriate for developmental age)  Prior Inpatient Therapy Prior Inpatient Therapy: Yes Prior Therapy Dates: Feb 2019; November 2018 Prior Therapy Facilty/Provider(s): [facility] Facility in [place] Reason for Treatment: Suicide Attempt; SI  Prior Outpatient Therapy Prior Outpatient Therapy: Yes Prior Therapy Dates: current Prior Therapy Facilty/Provider(s): [facility] Reason for Treatment: depression, paranoioa Does patient have an ACCT team?: No Does patient have Intensive In-House Services?  : No Does patient have [facility] services? : Yes Does patient have [facility] services?: No  ADL Screening (condition at time of admission) Patient's cognitive ability adequate to safely complete daily activities?: Yes Is the patient  deaf or have difficulty hearing?: No Does the patient have difficulty seeing, even when wearing glasses/contacts?: No Does the patient have difficulty concentrating, remembering, or  making decisions?: No Patient able to express need for assistance with ADLs?: Yes Does the patient have difficulty dressing or bathing?: No Independently performs ADLs?: Yes (appropriate for developmental age) Does the patient have difficulty walking or climbing stairs?: No Weakness of Legs: None Weakness of Arms/Hands: None  Home Assistive Devices/Equipment Home Assistive Devices/Equipment: None    Abuse/Neglect Assessment (Assessment to be complete while patient is alone) Abuse/Neglect Assessment Can Be Completed: Yes Physical Abuse: Denies Verbal Abuse: Denies Sexual Abuse: Denies Exploitation of patient/patient's resources: Denies Self-Neglect: Denies     Merchant navy officer (For Healthcare) Does Patient Have a Medical Advance Directive?: No Would patient like information on creating a medical advance directive?: No - Patient declined    Additional Information 1:1 In Past 12 Months?: No CIRT Risk: No Elopement Risk: No Does patient have medical clearance?: (pending)     Disposition: TTS consulted with Nira Conn, NP who recommends inpt treatment. EDP Dr Judd Lien, MD and pt's nurse Koleen Nimrod, RN have been advised of the disposition. TTS to seek placement. [facility] currently at capacity per [facility].   Disposition Initial Assessment Completed for this Encounter: Yes Disposition of Patient: Inpatient treatment program Type of inpatient treatment program: Adult(per Nira Conn, NP)  This service was provided via telemedicine using a 2-way, interactive audio and video technology.  Names of all persons participating in this telemedicine service and their role in this encounter. Name: Chris Calamity Role: Patient  Name: Princess Bruins Role: TTS Counselor           Karolee Ohs 04/22/2017 2:40 AM

## 2017-04-22 NOTE — ED Triage Notes (Signed)
[facility] called for TTS 

## 2017-04-22 NOTE — Progress Notes (Signed)
Report received from admitting nurse. Patient denies SI/HI/AVH and pain at this time. Orders received and acknowledged.  Medications administered as per order. Patient verbally contracts for safety on the unit and agrees to come to staff before acting on any self harm thoughts/feelings and other needs or concerns. 15 min checks initiated for safety and patient oriented to the unit and the unit schedule. 

## 2017-04-22 NOTE — Plan of Care (Signed)
  Safety: Ability to remain free from injury will improve 04/22/2017 2039 - Progressing by Delos HaringPhillips, Takesha Steger A, RN Note Pt safe on the unit at this time

## 2017-04-22 NOTE — ED Triage Notes (Signed)
PT awake and Pt was updated that Father and wife called .

## 2017-04-23 DIAGNOSIS — Y905 Blood alcohol level of 100-119 mg/100 ml: Secondary | ICD-10-CM

## 2017-04-23 DIAGNOSIS — F332 Major depressive disorder, recurrent severe without psychotic features: Principal | ICD-10-CM

## 2017-04-23 DIAGNOSIS — G4700 Insomnia, unspecified: Secondary | ICD-10-CM

## 2017-04-23 DIAGNOSIS — F17210 Nicotine dependence, cigarettes, uncomplicated: Secondary | ICD-10-CM

## 2017-04-23 DIAGNOSIS — F419 Anxiety disorder, unspecified: Secondary | ICD-10-CM

## 2017-04-23 DIAGNOSIS — Z818 Family history of other mental and behavioral disorders: Secondary | ICD-10-CM

## 2017-04-23 DIAGNOSIS — R450 Nervousness: Secondary | ICD-10-CM

## 2017-04-23 DIAGNOSIS — T43212A Poisoning by selective serotonin and norepinephrine reuptake inhibitors, intentional self-harm, initial encounter: Secondary | ICD-10-CM

## 2017-04-23 DIAGNOSIS — T1491XA Suicide attempt, initial encounter: Secondary | ICD-10-CM

## 2017-04-23 DIAGNOSIS — T510X2A Toxic effect of ethanol, intentional self-harm, initial encounter: Secondary | ICD-10-CM

## 2017-04-23 MED ORDER — SERTRALINE HCL 25 MG PO TABS
25.0000 mg | ORAL_TABLET | Freq: Every day | ORAL | Status: DC
  Administered 2017-04-23 – 2017-04-25 (×3): 25 mg via ORAL
  Filled 2017-04-23 (×4): qty 1

## 2017-04-23 MED ORDER — QUETIAPINE FUMARATE 100 MG PO TABS
100.0000 mg | ORAL_TABLET | Freq: Every day | ORAL | Status: DC
  Administered 2017-04-23 – 2017-04-24 (×2): 100 mg via ORAL
  Filled 2017-04-23 (×3): qty 1

## 2017-04-23 NOTE — [facility] Suicide Risk Assessment (Signed)
[facility] INPATIENT:  Family/Significant Other Suicide Prevention Education  Suicide Prevention Education:  Patient Refusal for Family/Significant Other Suicide Prevention Education: The patient Chris Olson has refused to provide written consent for family/significant other to be provided Family/Significant Other Suicide Prevention Education during admission and/or prior to discharge.  Physician notified.  Maeola SarahJolan E Sherree Shankman 04/23/2017, 9:52 AM

## 2017-04-23 NOTE — [facility] Counselor (Signed)
Adult Comprehensive Assessment  Patient ID: Chris CalamityLendell Crofford, male   DOB: 05/06/1990, 27 y.o.   MRN: [id number] Information Source: Information source: Patient  Current Stressors:  Educational / Learning stressors: Denies Employment / Job issues: Unemployed; On disability; "i've never been able to hold down a job." Family Relationships: Strained relationship with his wife, wife is asking for divorce. Close to his 2 kids; "I haven't spoken to my mother in months." Financial / Lack of resources (include bankruptcy): [place] and [place] Housing / Lack of housing: Living with his father temporarily; Reports he does not know if he will return (his choice) Physical health (include injuries & life threatening diseases): Denies Social relationships:Patient reports having a poor social support network.  Substance abuse: Patient reports drinking 1-2 beers occasionally, also reports smoking cannabis "every-now-and-then" Bereavement / Loss: Loss of marriage. "I'm having trouble accepting that my wife wants a divorce when I've been trying so hard to make things right."   Living/Environment/Situation:  Living Arrangements: Temporarily lives with his father Living conditions (as described by patient or guardian): "Okay" How long has patient lived in current situation?: 1 month What is atmosphere in current home: Temporary  Family History:  Marital status: Separated Separated, when?: few days ago What types of issues is patient dealing with in the relationship?: Pt reports that his wife wants a divorce. "I don't know what the problem is but I feel like it's me." Additional relationship information: n/a  Are you sexually active?: Yes What is your sexual orientation?: heterosexual Has your sexual activity been affected by drugs, alcohol, medication, or emotional stress?: n/a  Does patient have children?: Yes How many children?: 2 How is patient's relationship with their children?:  8yo boy and 6yo girl. "I see them everyday. We have a good relationship."   Childhood History:  By whom was/is the patient raised?: Both parents Additional childhood history information: Mom and dad were married until patient turned [id number]. "My childhood was okay. My parents were controlling."  Description of patient's relationship with caregiver when they were a child: close to mother; close to father Patient's description of current relationship with people who raised him/her: no relationship with mother currently; strained/fair relationship with father. "He tries to help me out but I really don't feel like I can talk to him."  How were you disciplined when you got in trouble as a child/adolescent?: n/a  Does patient have siblings?: Yes Number of Siblings: 2 Description of patient's current relationship with siblings: one brother and one sister. "My brother is very depressed." close to both siblings.  Did patient suffer any verbal/emotional/physical/sexual abuse as a child?: No Did patient suffer from severe childhood neglect?: No Has patient ever been sexually abused/assaulted/raped as an adolescent or adult?: No Was the patient ever a victim of a crime or a disaster?: No  Education:  Highest grade of school patient has completed: 12 th grade Currently a student?: No Name of school: NA Learning disability?: No  Employment/Work Situation:   Employment situation: On disability Why is patient on disability: schizophrenia/mental illness How long has patient been on disability: few years Patient's job has been impacted by current illness: Yes Describe how patient's job has been impacted: unable to [employer] employment due to "thoughts getting jumpled in my head and agitation."  What is the longest time patient has a held a job?: 2 months in high school Where was the patient employed at that time?: [employer]  Has patient ever been in the [employer] and Companymilitary?: No  Has patient ever served in combat?:  No Did You Receive Any Psychiatric Treatment/Services While in the Military?: No Are There Guns or Other Weapons in Your Home?: No Are These Weapons Safely Secured?: (n/a)  Financial Resources:   Financial resources: [employer], Medicaid, Support from parents / caregiver Does patient have a Lawyer or guardian?: No  Alcohol/Substance Abuse:   What has been your use of drugs/alcohol within the last 12 months?:Patient reports history of intermittent alcohol and marijuana use.  If attempted suicide, did drugs/alcohol play a role in this?: No(pt reports that he was sober when he felt suicidal) Alcohol/Substance Abuse Treatment Hx: Denies past history, Past Tx, Outpatient If yes, describe treatment: [facility] history but has not gone to appt or taken psychiatric medications in over a month.  Has alcohol/substance abuse ever caused legal problems?: No  Social Support System:   Forensic psychologist System: Poor Describe Community Support System: Patient denies positive social supports Type of faith/religion: Christian How does patient's faith help to cope with current illness?: prayer; Recruitment consultant:   Leisure and Hobbies: "Spending time with my kids"  Strengths/Needs:   What things does the patient do well?: "I don't know." In what areas does patient struggle / problems for patient: coping with my wife wanting a divorce; lonliness; medication compliance; building a support network  Discharge Plan:   Does patient have access to transportation?: Yes(bus pass) Will patient be returning to same living situation after discharge?: Patient reports he does know if he will return to his father's home or if he will get a hotel room at discharge. CSW will continue to follow.  Currently receiving community mental health services: No If no, would patient like referral for services when discharged?: Yes ([place]) Does patient have financial barriers  related to discharge medications?: No  Summary/Recommendations:   Summary and Recommendations (to be completed by the evaluator): Zymire is a 27 year old male who is diagnosed with Major depressive disorder, Recurrent episode, Severe, Cannabis use disorder, severe, and Alcohol use disorder, severe. He presented to the hospital seeking treatment for a suicide attempt by overdosing on medications, suicidal ideation and depressive symptoms. During the assessment, the patient was very lethargic and drowsy, however he was able to provide information. Marquise reports that he has battled severe depression since seperating from his wife. Although he did not elaborate on the specifc issues he and his wife are currently going through, he did state that his wife continues to want a divorce and he is currently having financial and employment stressors. Abdi reports that he has lived with his father the last month and is currently seeking employment to support his children. Jayshun states that he does not have a substance abuse issue and currently does not want to go to any substance abuse treatment at discharge. Gwynn states that he does not know if he will return to his father's home, but plans to get a hotel room for shelter and will follow up with [facility] at discharge for outpatient medication management. Johncharles can benefit from crisis stabilization, medication management, therapeutic milieu and referral services.  Maeola Sarah. 04/23/2017

## 2017-04-23 NOTE — [facility] Group Notes (Signed)
[facility] Mental Health Association Group Therapy 04/23/2017 1:15pm  Type of Therapy: Mental Health Association Presentation  Participation Level: Active  Participation Quality: Attentive  Affect: Appropriate  Cognitive: Oriented  Insight: Developing/Improving  Engagement in Therapy: Engaged  Modes of Intervention: Discussion, Education and Socialization  Summary of Progress/Problems: Mental Health Association (MHA) Speaker came to talk about his personal journey with mental health. The pt processed ways by which to relate to the speaker. MHA speaker provided handouts and educational information pertaining to groups and services offered by the MHA. Pt was engaged in speaker's presentation and was receptive to resources provided.    Chesley Veasey N Smart, LCSW 04/23/2017 3:26 PM  

## 2017-04-23 NOTE — Progress Notes (Signed)
Patient ID: Chris Olson, male   DOB: 11/04/1990, 27 y.o.   MRN: [id number]  DAR: Pt. Denies SI/HI and A/V Hallucinations. He reports that his sleep last night was good, his appetite is good, his energy level is normal, and his concentration is good. He rates his depression level is 0/10, hopelessness level is 1/10, and anxiety level is 0/10. Patient does not report any pain or discomfort at this time. Support and encouragement provided to the patient. Scheduled medications administered to patient per physician's orders. Patient is minimal but more talkative and more willing to speak with staff as the day progresses. He reports that a stressor for him is follow-up at discharge. Writer encouraged patient to speak with social worker Jolan, and placed patient on the phone with her. He denies any withdrawal symptoms. Q15 minute checks are maintained for safety.

## 2017-04-23 NOTE — [facility] Suicide Risk Assessment (Signed)
[facility] Admission Suicide Risk Assessment   Nursing information obtained from:  Patient Demographic factors:  Male Current Mental Status:  Self-harm thoughts Loss Factors:  Financial problems / change in socioeconomic status, Loss of significant relationship Historical Factors:  Prior suicide attempts Risk Reduction Factors:  NA  Total Time spent with patient: 1 hour Principal Problem: MDD (major depressive disorder), recurrent episode, severe (HCC) Diagnosis:   Patient Active Problem List   Diagnosis Date Noted  . MDD (major depressive disorder), recurrent episode, severe (HCC) [F33.2] 04/22/2017  . Adjustment disorder with mixed disturbance of emotions and conduct [F43.25] 04/18/2017  . Accidental drug overdose [T50.901A]    Subjective Data:   Chris Olson is a 27 y/o M with history of MDD and substance abuse who was admitted with worsening depression, anxiety, and overdose of his prescription medication of trazodone combined with alcohol. Pt had been discharged from [facility] on 2/10 with plan for outpatient follow up, but he did not go to his follow up appointment at [facility]. Pt had reported that his overdose was not an attempt to end his life but rather to numb himself and "get high" when he combined his medications with alcohol provided by friends.  Upon initial presentation, pt shares, "I'm going through it with my wife, and I was just trying to fix everything, but I'm just wanting to get better. I was feeling like it was too much and I just wanted to get high, but now I realize that I need to get help." Pt denies HI/AH/VH, but reports having some vague thoughts of SI by crashing his car. He reports depressed mood, anhedonia, guilty feelings, low energy, and poor concentration. He denies symptoms of mania, OCD, and PTSD. He reports using cannabis "a few times" since his last discharge, and drinking "about two or three beers" on a few days after discharge.   Discussed with patient about  treatment options. He is in agreement to be restarted on previous discharge medications of zoloft and seroquel. He agrees for referral to substance use treatment, and he will meet with the SW team to discuss options.  Continued Clinical Symptoms:  Alcohol Use Disorder Identification Test Final Score (AUDIT): 6 The "Alcohol Use Disorders Identification Test", Guidelines for Use in Primary Care, Second Edition.  World Science writer [facility]). Score between 0-7:  no or low risk or alcohol related problems. Score between 8-15:  moderate risk of alcohol related problems. Score between 16-19:  high risk of alcohol related problems. Score 20 or above:  warrants further diagnostic evaluation for alcohol dependence and treatment.   CLINICAL FACTORS:   Severe Anxiety and/or Agitation Depression:   Comorbid alcohol abuse/dependence Alcohol/Substance Abuse/Dependencies Unstable or Poor Therapeutic Relationship   Musculoskeletal: Strength & Muscle Tone: within normal limits Gait & Station: normal Patient leans: N/A  Psychiatric Specialty Exam: Physical Exam  Nursing note and vitals reviewed.   Review of Systems  Constitutional: Negative for chills and fever.  Psychiatric/Behavioral: Positive for depression and substance abuse. The patient is nervous/anxious.     Blood pressure 115/64, pulse 94, temperature 98.2 F (36.8 C), temperature source Oral, resp. rate 16, height 5\' 8"  (1.727 m), weight 62.1 kg (137 lb).Body mass index is 20.83 kg/m.  General Appearance: Fairly Groomed  Eye Contact:  Good  Speech:  Clear and Coherent and Normal Rate  Volume:  Normal  Mood:  Anxious and Depressed  Affect:  Congruent  Thought Process:  Coherent and Goal Directed  Orientation:  Full (Time, Place, and Person)  Thought Content:  Logical  Suicidal Thoughts:  Yes.  with intent/plan  Homicidal Thoughts:  No  Memory:  Immediate;   Fair Recent;   Fair Remote;   Fair  Judgement:  Impaired  Insight:   Lacking  Psychomotor Activity:  Normal  Concentration:  Concentration: Fair  Recall:  [employer]  Fund of Knowledge:  Fair  Language:  Fair  Akathisia:  No  Handed:    AIMS (if indicated):     Assets:  Resilience  ADL's:  Intact  Cognition:  WNL  Sleep:  Number of Hours: 6.75      COGNITIVE FEATURES THAT CONTRIBUTE TO RISK:  None    SUICIDE RISK:   Moderate:  Frequent suicidal ideation with limited intensity, and duration, some specificity in terms of plans, no associated intent, good self-control, limited dysphoria/symptomatology, some risk factors present, and identifiable protective factors, including available and accessible social support.  PLAN OF CARE:   - admit to inpatient level of care  -MDD, rec, sev   - Start zoloft 50mg  po qDay   - Start seroquel 100mg  po qhs  - Continue all other medication orders without changes -SW team to discuss treatment options -Encourage participation in groups and therapeutic milieu -DC planning will be onoing  I certify that inpatient services furnished can reasonably be expected to improve the patient's condition.   Micheal Likenshristopher T Domonik Levario, MD 04/23/2017, 6:09 PM

## 2017-04-23 NOTE — Progress Notes (Signed)
D   Pt is pleasant on approach and cooperative  Pt requested to take his seroquel early because he said it takes a long while for it to kick in and he doesn't like to lay in the bed waiting for it to work A   Verbal support given    Medications administered and effectiveness monitored   Q 15 min checks   Educate on seroquel and administered medication early R   Pt is safe and receptive to verbal support

## 2017-04-23 NOTE — [facility] Group Notes (Signed)
[facility] Group Notes: Meditation  Date:  04/23/2017  Time:  4:27 PM  Type of Therapy:  Psychoeducational Skills  Participation Level:  None  Participation Quality:  Resistant  Affect:  Blunted  Cognitive:  Alert  Insight:  Limited  Engagement in Group:  Lacking  Modes of Intervention:  Activity and Education  Summary of Progress/Problems: Patient attended group but did not participate in meditation. He appeared anxious, bouncing his knee up and down in his chair.   Marzetta BoardDopson, Jonita Hirota E 04/23/2017, 4:27 PM
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
## 2017-04-23 NOTE — Plan of Care (Signed)
  Progressing Medication: Compliance with prescribed medication regimen will improve 04/23/2017 1801 - Progressing by Lenord Fellersopson, Gerard Bonus Elizabeth, RN

## 2017-04-24 DIAGNOSIS — F1010 Alcohol abuse, uncomplicated: Secondary | ICD-10-CM

## 2017-04-24 DIAGNOSIS — F1290 Cannabis use, unspecified, uncomplicated: Secondary | ICD-10-CM

## 2017-04-24 NOTE — Progress Notes (Signed)
Recreation Therapy Notes  Date: 04/24/17 Time: 0930 Location: [facility] Dayroom  Group Topic: Stress Management  Goal Area(s) Addresses:  Patient will verbalize importance of using healthy stress management.  Patient will identify positive emotions associated with healthy stress management.   Intervention: Stress Management  Activity :  Progressive Muscle Relaxation.  LRT introduced the stress management technique of progressive muscle relaxation.  LRT read a script to guide the patients through the process of tensing and relaxing each muscle group individually.    Education:  Stress Management, Discharge Planning.   Education Outcome: Acknowledges edcuation/In group clarification offered/Needs additional education  Clinical Observations/Feedback: Pt did not attend group.     Caroll RancherMarjette Tysheena Ginzburg, LRT/CTRS         Caroll RancherLindsay, [name] A 04/24/2017 11:54 AM

## 2017-04-24 NOTE — Progress Notes (Signed)
Patient ID: Chris Olson, male   DOB: 11/16/1990, 27 y.o.   MRN: [id number]  DAR: Pt. Denies SI/HI and A/V Hallucinations. He reports that his sleep last night was good, his appetite is good, his energy level is normal, and his concentration is good. He rates his depression, hopelessness, and anxiety levels 0/10. Patient does not report any pain or discomfort at this time. Support and encouragement provided to the patient. Scheduled medications and PRN nicorette is administered to patient per physician's orders. Patient is minimal but cooperative. He is seen in the milieu intermittently and interacting with some of his peers. Q15 minute checks are maintained for safety.

## 2017-04-24 NOTE — Progress Notes (Signed)
[facility] MD Progress Note  04/24/2017 2:35 PM Chris Olson  MRN:  [id number]   Subjective: Khian reports, "I'm doing okay. I think I'm ready to be discharged tomorrow. I just learned today that I have been accepted at the [facility]. I had called them yesterday to inquire about the program. I was informed to come in by tomorrow to sign the necessary paper work. This programs has group sessions & daily meetings for substance abuse issues. I'm ready to change my life. I'm tired of living this life. I'm also, a little sad today because today is my anniversary. We would have been married 2 years today if all is well. I will need an appointment with [facility] prior to discharge".  Objective: Chris Olson is a 27 y/o M with history of MDD and substance abuse who was admitted with worsening depression, anxiety, and overdose of his prescription medication of trazodone combined with alcohol. Pt had been discharged from [facility] on 2/10 with plan for outpatient follow up, but he did not go to his follow up appointment at [facility]. Pt had reported that his overdose was not an attempt to end his life but rather to numb himself and "get high" when he combined his medications with alcohol. Today, 04-24-17, Chris Olson is seen, chart reviewed. The chart findings discussed with the treatment team. He is alert, oriented & aware of situation. He is visible on the unit, participating in the group sessions. He says he is doing much better. Has been accepted at the [facility] to continue substance abuse treatment after discharge. He denies any SIHI, AVH, delusional thoughts or paranoia. Denies any substance withdrawal symptoms. Denies any adverse effects from his medications. No changes made on his treatment regimen.  Principal Problem: MDD (major depressive disorder), recurrent episode, severe (HCC) Diagnosis:   Patient Active Problem List   Diagnosis Date Noted  . MDD (major depressive disorder),  recurrent episode, severe (HCC) [F33.2] 04/22/2017    Priority: High  . Adjustment disorder with mixed disturbance of emotions and conduct [F43.25] 04/18/2017  . Accidental drug overdose [T50.901A]    Total Time spent with patient: 15 minutes  Past Psychiatric History: See H&P  Past Medical History:  Past Medical History:  Diagnosis Date  . Chest pain   . Pericarditis    History reviewed. No pertinent surgical history. Family History: History reviewed. No pertinent family history.  Family Psychiatric  History: See H&P  Social History:  Social History   Substance and Sexual Activity  Alcohol Use Yes   Comment: social use -- BAC was clear     Social History   Substance and Sexual Activity  Drug Use Yes  . Types: Marijuana   Comment: UDS was clear    Social History   Socioeconomic History  . Marital status: Married    Spouse name: None  . Number of children: None  . Years of education: None  . Highest education level: None  Social Needs  . Financial resource strain: None  . Food insecurity - worry: None  . Food insecurity - inability: None  . Transportation needs - medical: None  . Transportation needs - non-medical: None  Occupational History  . None  Tobacco Use  . Smoking status: Current Every Day Smoker    Types: Cigarettes  . Smokeless tobacco: Never Used  Substance and Sexual Activity  . Alcohol use: Yes    Comment: social use -- BAC was clear  . Drug use: Yes  Types: Marijuana    Comment: UDS was clear  . Sexual activity: Not Currently  Other Topics Concern  . None  Social History Narrative  . None   Additional Social History:   Sleep: Good  Appetite:  Good  Current Medications: Current Facility-Administered Medications  Medication Dose Route Frequency Provider Last Rate Last Dose  . acetaminophen (TYLENOL) tablet 650 mg  650 mg Oral Q6H PRN Thermon Leylandavis, Laura A, NP      . alum & mag hydroxide-simeth (MAALOX/MYLANTA) 200-200-20 MG/5ML  suspension 30 mL  30 mL Oral Q4H PRN Fransisca Kaufmannavis, Laura A, NP      . magnesium hydroxide (MILK OF MAGNESIA) suspension 30 mL  30 mL Oral Daily PRN Fransisca Kaufmannavis, Laura A, NP      . nicotine polacrilex (NICORETTE) gum 2 mg  2 mg Oral PRN Georgiann CockerIzediuno, Vincent A, MD   2 mg at 04/23/17 2014  . QUEtiapine (SEROQUEL) tablet 100 mg  100 mg Oral QHS Nwoko, Agnes I, NP   100 mg at 04/23/17 2014  . sertraline (ZOLOFT) tablet 25 mg  25 mg Oral Daily Armandina StammerNwoko, Agnes I, NP   25 mg at 04/24/17 [id number]   Lab Results:  No results found for this or any previous visit (from the past 48 hour(s)).  Blood Alcohol level:  Lab Results  Component Value Date   ETH 103 (H) 04/21/2017   ETH <10 04/17/2017   Metabolic Disorder Labs: Lab Results  Component Value Date   HGBA1C 5.2 04/05/2017   MPG 102.54 04/05/2017   MPG 94 09/11/2016   No results found for: PROLACTIN Lab Results  Component Value Date   CHOL 165 04/05/2017   TRIG 213 (H) 04/05/2017   HDL 44 04/05/2017   CHOLHDL 3.8 04/05/2017   VLDL 43 (H) 04/05/2017   LDLCALC 78 04/05/2017   Physical Findings: AIMS: Facial and Oral Movements Muscles of Facial Expression: None, normal Lips and Perioral Area: None, normal Jaw: None, normal Tongue: None, normal,Extremity Movements Upper (arms, wrists, hands, fingers): None, normal Lower (legs, knees, ankles, toes): None, normal, Trunk Movements Neck, shoulders, hips: None, normal, Overall Severity Severity of abnormal movements (highest score from questions above): None, normal Incapacitation due to abnormal movements: None, normal Patient's awareness of abnormal movements (rate only patient's report): No Awareness, Dental Status Current problems with teeth and/or dentures?: No Does patient usually wear dentures?: No  CIWA:    COWS:     Musculoskeletal: Strength & Muscle Tone: within normal limits Gait & Station: normal Patient leans: N/A  Psychiatric Specialty Exam: Physical Exam  Vitals reviewed. Constitutional:  He appears well-developed.  Cardiovascular: Normal rate.  Neurological: He is alert.  Psychiatric: He has a normal mood and affect. His behavior is normal.    Review of Systems  Psychiatric/Behavioral: Positive for depression. The patient is nervous/anxious.   All other systems reviewed and are negative.   Blood pressure 114/67, pulse 98, temperature 98.2 F (36.8 C), temperature source Oral, resp. rate 18, height 5\' 8"  (1.727 m), weight 62.1 kg (137 lb).Body mass index is 20.83 kg/m.  General Appearance: Casual and Fairly Groomed  Eye Contact:  Fair  Speech:  Clear and Coherent  Volume:  Normal  Mood:  "I feel a lot better"  Affect:  Congruent and Improved  Thought Process:  Coherent and Descriptions of Associations: Intact  Orientation:  Full (Time, Place, and Person)  Thought Content:  Rumination, denies any hallucinations.  Suicidal Thoughts:  Denies.  Homicidal Thoughts:  No  Memory:  Immediate;   Fair Recent;   Fair Remote;   Fair  Judgement:  Fair  Insight:  Fair  Psychomotor Activity:  Normal  Concentration:  Concentration: Fair  Recall:  [employer] of Knowledge:  Fair  Language:  Fair  Akathisia:  No  Handed:  Right  AIMS (if indicated):     Assets:  Communication Skills Desire for Improvement Resilience Social Support  ADL's:  Intact  Cognition:  WNL  Sleep:  Number of Hours: 6.5   Treatment Plan Summary: Daily contact with patient to assess and evaluate symptoms and progress in treatment and Medication management: Continue the inpatient hospitalization.   Will continue today 04/24/2017 plan as below except where it is noted.  Mood control.   - Continue Seroquel 100 mg po daily.   Depression:    - Continue Zoloft 25 mg po daily.   -  Continue Nicorette gum 2 mg po daily.Will continue to monitor vitals, medication compliance and treatment side effects during hospitalization.   CSW to continue working on disposition.  Patient to participate in  therapeutic milieu  Armandina Stammer, NP, PMHNP, FNP-BC 04/24/2017, 2:35 PM

## 2017-04-24 NOTE — Tx Team (Signed)
Interdisciplinary Treatment and Diagnostic Plan Update  04/24/2017 Time of Session: [id number] Chris Olson MRN: [id number]  Principal Diagnosis: MDD (major depressive disorder), recurrent episode, severe (HCC)  Secondary Diagnoses: Principal Problem:   MDD (major depressive disorder), recurrent episode, severe (HCC)   Current Medications:  Current Facility-Administered Medications  Medication Dose Route Frequency Provider Last Rate Last Dose  . acetaminophen (TYLENOL) tablet 650 mg  650 mg Oral Q6H PRN Thermon Leylandavis, Laura A, NP      . alum & mag hydroxide-simeth (MAALOX/MYLANTA) 200-200-20 MG/5ML suspension 30 mL  30 mL Oral Q4H PRN Fransisca Kaufmannavis, Laura A, NP      . magnesium hydroxide (MILK OF MAGNESIA) suspension 30 mL  30 mL Oral Daily PRN Fransisca Kaufmannavis, Laura A, NP      . nicotine polacrilex (NICORETTE) gum 2 mg  2 mg Oral PRN Georgiann CockerIzediuno, Vincent A, MD   2 mg at 04/23/17 2014  . QUEtiapine (SEROQUEL) tablet 100 mg  100 mg Oral QHS Nwoko, Agnes I, NP   100 mg at 04/23/17 2014  . sertraline (ZOLOFT) tablet 25 mg  25 mg Oral Daily Armandina StammerNwoko, Agnes I, NP   25 mg at 04/24/17 0806   PTA Medications: Medications Prior to Admission  Medication Sig Dispense Refill Last Dose  . nicotine polacrilex (NICORETTE) 2 MG gum Take 1 each (2 mg total) by mouth as needed for smoking cessation. (Patient not taking: Reported on 04/17/2017) 100 tablet 0 Not Taking at Unknown time  . QUEtiapine (SEROQUEL) 100 MG tablet Take 1 tablet (100 mg total) by mouth at bedtime. 30 tablet 0 Past Week at Unknown time  . sertraline (ZOLOFT) 25 MG tablet Take 1 tablet (25 mg total) by mouth daily. 30 tablet 0 Past Week at Unknown time    Patient Stressors: Financial difficulties Marital or family conflict Substance abuse  Patient Strengths: Wellsite geologistCommunication skills General fund of knowledge Motivation for treatment/growth Physical Health  Treatment Modalities: Medication Management, Group therapy, Case management,  1 to 1 session with  clinician, Psychoeducation, Recreational therapy.   Physician Treatment Plan for Primary Diagnosis: MDD (major depressive disorder), recurrent episode, severe (HCC) Long Term Goal(s): Improvement in symptoms so as ready for discharge Improvement in symptoms so as ready for discharge   Short Term Goals: Ability to identify changes in lifestyle to reduce recurrence of condition will improve Ability to verbalize feelings will improve Ability to demonstrate self-control will improve Ability to identify and develop effective coping behaviors will improve Compliance with prescribed medications will improve Ability to identify triggers associated with substance abuse/mental health issues will improve  Medication Management: Evaluate patient's response, side effects, and tolerance of medication regimen.  Therapeutic Interventions: 1 to 1 sessions, Unit Group sessions and Medication administration.  Evaluation of Outcomes: Progressing  Physician Treatment Plan for Secondary Diagnosis: Principal Problem:   MDD (major depressive disorder), recurrent episode, severe (HCC)  Long Term Goal(s): Improvement in symptoms so as ready for discharge Improvement in symptoms so as ready for discharge   Short Term Goals: Ability to identify changes in lifestyle to reduce recurrence of condition will improve Ability to verbalize feelings will improve Ability to demonstrate self-control will improve Ability to identify and develop effective coping behaviors will improve Compliance with prescribed medications will improve Ability to identify triggers associated with substance abuse/mental health issues will improve     Medication Management: Evaluate patient's response, side effects, and tolerance of medication regimen.  Therapeutic Interventions: 1 to 1 sessions, Unit Group sessions and Medication administration.  Evaluation of  Outcomes: Progressing   RN Treatment Plan for Primary Diagnosis: MDD (major  depressive disorder), recurrent episode, severe (HCC) Long Term Goal(s): Knowledge of disease and therapeutic regimen to maintain health will improve  Short Term Goals: Ability to remain free from injury will improve, Ability to disclose and discuss suicidal ideas and Ability to identify and develop effective coping behaviors will improve  Medication Management: RN will administer medications as ordered by provider, will assess and evaluate patient's response and provide education to patient for prescribed medication. RN will report any adverse and/or side effects to prescribing provider.  Therapeutic Interventions: 1 on 1 counseling sessions, Psychoeducation, Medication administration, Evaluate responses to treatment, Monitor vital signs and CBGs as ordered, Perform/monitor CIWA, COWS, AIMS and Fall Risk screenings as ordered, Perform wound care treatments as ordered.  Evaluation of Outcomes: Progressing   LCSW Treatment Plan for Primary Diagnosis: MDD (major depressive disorder), recurrent episode, severe (HCC) Long Term Goal(s): Safe transition to appropriate next level of care at discharge, Engage patient in therapeutic group addressing interpersonal concerns.  Short Term Goals: Engage patient in aftercare planning with referrals and resources, Facilitate patient progression through stages of change regarding substance use diagnoses and concerns and Identify triggers associated with mental health/substance abuse issues  Therapeutic Interventions: Assess for all discharge needs, 1 to 1 time with Social worker, Explore available resources and support systems, Assess for adequacy in community support network, Educate family and significant other(s) on suicide prevention, Complete Psychosocial Assessment, Interpersonal group therapy.  Evaluation of Outcomes: Progressing   Progress in Treatment: Attending groups: Yes. Participating in groups: Yes. Taking medication as prescribed:  Yes. Toleration medication: Yes. Family/Significant other contact made: SPE completed with pt; pt declined to consent to family contact.  Patient understands diagnosis: Yes. Discussing patient identified problems/goals with staff: Yes. Medical problems stabilized or resolved: Yes. Denies suicidal/homicidal ideation: Yes. Issues/concerns per patient self-inventory: No. Other: n/a   New problem(s) identified: No, Describe:  n/a  New Short Term/Long Term Goal(s): detox, medication management for mood stabilization; elimination of SI thoughts; development of comprehensive mental wellness/sobriety plan.   Patient Goal: "to get my life together. I want someone to talk to like a therapist. I need more support."  Discharge Plan or Barriers: CSW assessing. Pt plans to return home; follow-up at [facility]. MHAG pamphlet and AA/NA list for [facility] provided to pt for additional community support.   Reason for Continuation of Hospitalization: Anxiety Depression Medication stabilization Suicidal ideation Withdrawal symptoms  Estimated Length of Stay: Friday, 04/26/17  Attendees: Patient: Chris Olson  04/24/2017 8:49 AM  Physician: Dr. Altamese [place] MD; Dr. Jackquline Berlin MD 04/24/2017 8:49 AM  Nursing: Armando Reichert RN; Christa RN  04/24/2017 8:49 AM  RN Care Manager: x 04/24/2017 8:49 AM  Social Worker: Trula Slade, LCSW 04/24/2017 8:49 AM  Recreational Therapist: x 04/24/2017 8:49 AM  Other: Armandina Stammer NP; Feliz Beam Money NP 04/24/2017 8:49 AM  Other:  04/24/2017 8:49 AM  Other: 04/24/2017 8:49 AM    Scribe for Treatment Team: Ledell Peoples Smart, LCSW 04/24/2017 8:49 AM

## 2017-04-24 NOTE — [facility] Group Notes (Signed)
LCSW Group Therapy Note 04/24/2017 12:57 PM  Type of Therapy/Topic: Group Therapy: Feelings about Diagnosis  Participation Level: Active   Description of Group:  This group will allow patients to explore their thoughts and feelings about diagnoses they have received. Patients will be guided to explore their level of understanding and acceptance of these diagnoses. Facilitator will encourage patients to process their thoughts and feelings about the reactions of others to their diagnosis and will guide patients in identifying ways to discuss their diagnosis with significant others in their lives. This group will be process-oriented, with patients participating in exploration of their own experiences, giving and receiving support, and processing challenge from other group members.  Therapeutic Goals: 1. Patient will demonstrate understanding of diagnosis as evidenced by identifying two or more symptoms of the disorder 2. Patient will be able to express two feelings regarding the diagnosis 3. Patient will demonstrate their ability to communicate their needs through discussion and/or role play  Summary of Patient Progress:  Chris Olson was engaged throughout the group session. He participated and contributed to the group's discussion. Chris Olson reports that he felt isolated when he learned he had a mental health diagnosis. He states that majority if his friends and even his loved ones have abandoned him as he goes through mental health struggles. Chris Olson reports he plans to strengthen his support network and hopefully find a sponsor who can help guide him in the right direction at discharge.      Therapeutic Modalities:  Cognitive Behavioral Therapy Brief Therapy Feelings Identification    Chris Olson Chris Olson LCSWA Clinical Social Worker

## 2017-04-24 NOTE — [facility] Group Notes (Signed)
[facility] Group Notes:  (Nursing/MHT/Case Management/Adjunct)  Date:  04/24/2017  Time:  1630  Type of Therapy:  Nurse Education - Promoting Wellness through Positive Self Talk  Participation Level:  Active  Participation Quality:  Appropriate  Affect:  Appropriate  Cognitive:  Alert  Insight:  Improving  Engagement in Group:  Engaged  Modes of Intervention:  Discussion, Education and Support  Summary of Progress/Problems: Patient attended group and actively participated in discussion. Patients were taught how to rephrase negative self talk to promote positive self esteem and wellness.    Merian CapronFriedman, Lakyn Mantione Otto [facility] 04/24/2017, 5:53 PM

## 2017-04-24 NOTE — Progress Notes (Signed)
Pt observed talking on the phone at the beginning of the shift.  He made multiple phone calls at that time and appeared to be irritable and argumentative with the person with whom he was talking.  When writer spoke with him to introduce self, he was guarded and stated he was ok and did not need anything except nicotine gum which he was given.  He also stated that he was probably going to be discharged tomorrow to go to a work/rehab program.  Clinical research associateWriter observed that he had minimal interaction with the other patients. He did not attend evening NA group.  Meds given as ordered.  Support and encouragement offered.  Discharge plans are in process.  Safety maintained with q15 minute checks.

## 2017-04-25 MED ORDER — QUETIAPINE FUMARATE 100 MG PO TABS: 100.0000 mg | ORAL_TABLET | Freq: Every day | ORAL | 0 refills | Status: DC

## 2017-04-25 MED ORDER — SERTRALINE HCL 25 MG PO TABS: 25.0000 mg | ORAL_TABLET | Freq: Every day | ORAL | 0 refills | Status: DC

## 2017-04-25 MED ORDER — NICOTINE POLACRILEX 2 MG MT GUM: 2.0000 mg | CHEWING_GUM | OROMUCOSAL | 0 refills | Status: DC | PRN

## 2017-04-25 NOTE — Progress Notes (Signed)
Patient ID: Chris Olson, male   DOB: 02/03/1991, 27 y.o.   MRN: [id number]  Discharge Note- Pt. denies SI/HI and A/V hallucinations. He reports that his sleep last night was good, his appetite is good, his energy level is normal, and his concentration is good. He rates his depression, hopelessness, and anxiety levels at 0/10. Belongings returned to patient at time of discharge. Patient denies any pain or discomfort. Discharge instructions and medications were reviewed with patient. Patient verbalized understanding of both medications and discharge instructions. Patient discharged to lobby where his ride was waiting. Q15 minute safety checks maintained until time of discharge. No distress upon discharge.

## 2017-04-25 NOTE — Discharge Summary (Addendum)
Physician Discharge Summary Note  Patient:  Chris Olson is an 27 y.o., male  MRN:  [id number]  DOB:  [birth year]  Patient phone:  [phone] (home)   Patient address:   [address] [place] [address],   Total Time spent with patient: Greater than 30 minutes  Date of Admission:  04/22/2017  Date of Discharge: 04-25-17  Reason for Admission: Worsening depression & overdose on prescription medication with alcohol.   Principal Problem: MDD (major depressive disorder), recurrent episode, severe [facility])  Discharge Diagnoses: Patient Active Problem List   Diagnosis Date Noted  . MDD (major depressive disorder), recurrent episode, severe (HCC) [F33.2] 04/22/2017    Priority: High  . Adjustment disorder with mixed disturbance of emotions and conduct [F43.25] 04/18/2017  . Accidental drug overdose [T50.901A]    Past Psychiatric History: Alcohol/THC use disorder, MDD.  Past Medical History:  Past Medical History:  Diagnosis Date  . Chest pain   . Pericarditis    History reviewed. No pertinent surgical history.  Family History: History reviewed. No pertinent family history.  Family Psychiatric  History: See H&P  Social History:  Social History   Substance and Sexual Activity  Alcohol Use Yes   Comment: social use -- BAC was clear     Social History   Substance and Sexual Activity  Drug Use Yes  . Types: Marijuana   Comment: UDS was clear    Social History   Socioeconomic History  . Marital status: Married    Spouse name: None  . Number of children: None  . Years of education: None  . Highest education level: None  Social Needs  . Financial resource strain: None  . Food insecurity - worry: None  . Food insecurity - inability: None  . Transportation needs - medical: None  . Transportation needs - non-medical: None  Occupational History  . None  Tobacco Use  . Smoking status: Current Every Day Smoker    Types: Cigarettes  . Smokeless tobacco:  Never Used  Substance and Sexual Activity  . Alcohol use: Yes    Comment: social use -- BAC was clear  . Drug use: Yes    Types: Marijuana    Comment: UDS was clear  . Sexual activity: Not Currently  Other Topics Concern  . None  Social History Narrative  . None   Hospital Course: (Per Md's SRA): Chris Olson is a 27 y/o AAM with history of MDD and substance abuse who was admitted with worsening depression, anxiety, and overdose of his prescription medication of trazodone combined with alcohol. Pt had been discharged from [facility] on 2/10 with plan for outpatient follow up, but he did not go to his follow up appointment at [facility]. Pt had reported that his overdose was not an attempt to end his life but rather to numb himself and "get high" when he combined his medications with alcohol provided by friends. Pt was restarted on previous discharge medications of zoloft and seroquel, and he reported incremental improvement of his presenting mood symptoms.  After evaluation of his presenting symptoms; Chris Olson was re-started on the treatment regimen meant to stabilize his worsening symptoms of depression. Although, admitted with a BAL of 103 & UDS of positive for THC, Chris Olson was not presenting with any substance withdrawal & as a result did not receive any detoxification treatments. However, he was medicated discharged on; Seroquel 100 mg for mood control & sertraline 25 mg for depression. He was enrolled in the group counseling sessions  being offered & held on this unit. He learned coping skills. Chris Olson presented no other significant health issues that required treatment. He tolerated his treatment regimen without any adverse effects or reactions reported. Chris Olson symptoms responded well to his treatment regimen, this is evidenced by his reports of improved mood & absence of substance withdrawal symptoms & suicidal thoughts.  Today upon his discharge evaluation with his attending psychiatrist, pt  reports that he is doing well overall. He shares his improved insights during this stay, stating, "I just want to build me a good support group, so that I can reach and call on someone if I'm having trouble." He asserts that his plan is to pursue sobriety and he is in agreement to follow up at [facility]. He denies SI/HI/AH/VH. He has no physical complaints today. He feels that he is tolerating his medications well. He is sleeping well. His appetite is good. He is in agreement to continue his current treatment plan without changes. He was able to engage in safety planning including [facility] or contact emergency services if he feels unable to maintain his own safety or the safety of others. Pt had no further questions, comments, or concerns.  Upon discharge, Chris RusselLindell was both mentally & medically stable. He was provided with all the necessary information needed to make his outpatient psychiatric appointments without problems. [facility] with all personal belongings in no apparent distress. Transportation per family.  Physical Findings: AIMS: Facial and Oral Movements Muscles of Facial Expression: None, normal Lips and Perioral Area: None, normal Jaw: None, normal Tongue: None, normal,Extremity Movements Upper (arms, wrists, hands, fingers): None, normal Lower (legs, knees, ankles, toes): None, normal, Trunk Movements Neck, shoulders, hips: None, normal, Overall Severity Severity of abnormal movements (highest score from questions above): None, normal Incapacitation due to abnormal movements: None, normal Patient's awareness of abnormal movements (rate only patient's report): No Awareness, Dental Status Current problems with teeth and/or dentures?: No Does patient usually wear dentures?: No  CIWA:    COWS:     Musculoskeletal: Strength & Muscle Tone: within normal limits Gait & Station: normal Patient leans: N/A  Psychiatric Specialty Exam: Physical Exam  Constitutional: He  appears well-developed.  HENT:  Head: Normocephalic.  Eyes: Pupils are equal, round, and reactive to light.  Neck: Normal range of motion.  Cardiovascular: Normal rate.  Respiratory: Effort normal.  GI: Soft.  Genitourinary:  Genitourinary Comments: Deferred  Musculoskeletal: Normal range of motion.  Neurological: He is alert.  Skin: Skin is warm.    Review of Systems  Constitutional: Negative.   HENT: Negative.   Eyes: Negative.   Respiratory: Negative.   Cardiovascular: Negative.   Gastrointestinal: Negative.   Genitourinary: Negative.   Musculoskeletal: Negative.   Skin: Negative.   Neurological: Negative.   Endo/Heme/Allergies: Negative.   Psychiatric/Behavioral: Positive for depression (Stable) and substance abuse. Negative for hallucinations, memory loss and suicidal ideas. The patient has insomnia (Stable). The patient is not nervous/anxious.     Blood pressure 122/77, pulse 85, temperature 98.3 F (36.8 C), temperature source Oral, resp. rate 16, height 5\' 8"  (1.727 m), weight 62.1 kg (137 lb).Body mass index is 20.83 kg/m.  See Md's SRA  Have you used any form of tobacco in the last 30 days? (Cigarettes, Smokeless Tobacco, Cigars, and/or Pipes): Yes  Has this patient used any form of tobacco in the last 30 days? (Cigarettes, Smokeless Tobacco, Cigars, and/or Pipes): Yes, an FDA-approved tobacco cessation medication was offered at discharge.  Blood Alcohol level:  Lab Results  Component Value Date   ETH 103 (H) 04/21/2017   ETH <10 04/17/2017   Metabolic Disorder Labs:  Lab Results  Component Value Date   HGBA1C 5.2 04/05/2017   MPG 102.54 04/05/2017   MPG 94 09/11/2016   No results found for: PROLACTIN Lab Results  Component Value Date   CHOL 165 04/05/2017   TRIG 213 (H) 04/05/2017   HDL 44 04/05/2017   CHOLHDL 3.8 04/05/2017   VLDL 43 (H) 04/05/2017   LDLCALC 78 04/05/2017   See Psychiatric Specialty Exam and Suicide Risk Assessment completed by  Attending Physician prior to discharge.  Discharge destination:  Home  Is patient on multiple antipsychotic therapies at discharge:  No   Has Patient had three or more failed trials of antipsychotic monotherapy by history:  No  Recommended Plan for Multiple Antipsychotic Therapies: NA  Allergies as of 04/25/2017   No Known Allergies     Medication List    TAKE these medications     Indication  nicotine polacrilex 2 MG gum Commonly known as:  NICORETTE Take 1 each (2 mg total) by mouth as needed for smoking cessation. (May purchase from over the counter at the pharmacy): For smoking cessation What changed:  additional instructions  Indication:  Nicotine Addiction   QUEtiapine 100 MG tablet Commonly known as:  SEROQUEL Take 1 tablet (100 mg total) by mouth at bedtime. For mood control What changed:  additional instructions  Indication:  Mood control   sertraline 25 MG tablet Commonly known as:  ZOLOFT Take 1 tablet (25 mg total) by mouth daily. For depression Start taking on:  04/26/2017 What changed:  additional instructions  Indication:  Major Depressive Disorder      Follow-up Information    [facility] Follow up on 05/01/2017.   Specialty:  Behavioral Health Why:  Hospital follow-up on Wed 3/6 at 8:00AM. Please bring: picture ID, medicaid card, and hospital discharge paperwork. Thank you.  [address] [place] [address] [phone]          Follow-up recommendations: Activity:  As tolerated Diet: As recommended by your primary care doctor. Keep all scheduled follow-up appointments as recommended.   Comments: Patient is instructed prior to discharge to: Take all medications as prescribed by his/her mental healthcare provider. Report any adverse effects and or reactions from the medicines to his/her outpatient provider promptly. Patient has been instructed & cautioned: To not engage in alcohol and or illegal drug use while on prescription  medicines. In the event of worsening symptoms, patient is instructed to call the crisis hotline, 911 and or go to the nearest ED for appropriate evaluation and treatment of symptoms. To follow-up with his/her primary care provider for your other medical issues, concerns and or health care needs.   Signed: Armandina Stammer, NP, PMHNP, FNP-BC 04/25/2017, 10:25 AM   Patient seen, Suicide Assessment Completed.  Disposition Plan Reviewed

## 2017-04-25 NOTE — Progress Notes (Signed)
  [facility] Discharge Plan :  Will you be returning to the same living situation after discharge:  Yes,  home At discharge, do you have transportation home?: Yes,  family member Do you have the ability to pay for your medications: Yes,  [facility]  Release of information consent forms completed and submitted to medical records by CSW.  Patient to Follow up at: Follow-up Information    [facility] Follow up on 05/01/2017.   Specialty:  Behavioral Health Why:  Hospital follow-up on Wed 3/6 at 8:00AM. Please bring: picture ID, medicaid card, and hospital discharge paperwork. Thank you.  Contact information: [address] [place] [address] [phone]           Next level of care provider has access to [facility] Link:no  Safety Planning and Suicide Prevention discussed: SPE completed with pt; pt declined to consent to family contact.   Have you used any form of tobacco in the last 30 days? (Cigarettes, Smokeless Tobacco, Cigars, and/or Pipes): Yes  Has patient been referred to the Quitline?: Patient refused referral  Patient has been referred for addiction treatment: Yes  Pulte HomesHeather N Smart, LCSW 04/25/2017, 9:14 AM

## 2017-04-25 NOTE — [facility]HH Suicide Risk Assessment (Signed)
[facility] Discharge Suicide Risk Assessment   Principal Problem: MDD (major depressive disorder), recurrent episode, severe (HCC) Discharge Diagnoses:  Patient Active Problem List   Diagnosis Date Noted  . MDD (major depressive disorder), recurrent episode, severe (HCC) [F33.2] 04/22/2017  . Adjustment disorder with mixed disturbance of emotions and conduct [F43.25] 04/18/2017  . Accidental drug overdose [T50.901A]     Total Time spent with patient: 30 minutes  Musculoskeletal: Strength & Muscle Tone: within normal limits Gait & Station: normal Patient leans: N/A  Psychiatric Specialty Exam: Review of Systems  Constitutional: Negative for chills and fever.  Respiratory: Negative for cough and shortness of breath.   Gastrointestinal: Negative for abdominal pain, heartburn, nausea and vomiting.  Psychiatric/Behavioral: Negative for depression, hallucinations and suicidal ideas. The patient is not nervous/anxious.     Blood pressure 122/77, pulse 85, temperature 98.3 F (36.8 C), temperature source Oral, resp. rate 16, height 5\' 8"  (1.727 m), weight 62.1 kg (137 lb).Body mass index is 20.83 kg/m.  General Appearance: Casual  Eye Contact::  Good  Speech:  Clear and Coherent and Normal Rate  Volume:  Normal  Mood:  Euthymic  Affect:  Appropriate  Thought Process:  Coherent  Orientation:  Full (Time, Place, and Person)  Thought Content:  Logical  Suicidal Thoughts:  No  Homicidal Thoughts:  No  Memory:  Immediate;   Fair Recent;   Fair Remote;   Fair  Judgement:  Fair  Insight:  Fair  Psychomotor Activity:  Normal  Concentration:  Good  Recall:  Good  Fund of Knowledge:Good  Language: Good  Akathisia:  No  Handed:    AIMS (if indicated):     Assets:  Communication Skills Resilience Social Support  Sleep:  Number of Hours: 6.5  Cognition: WNL  ADL's:  Intact   Mental Status Per Nursing Assessment::   On Admission:  Self-harm thoughts  Demographic Factors:  Male and  Low socioeconomic status  Loss Factors: NA  Historical Factors: Impulsivity  Risk Reduction Factors:   Positive social support, Positive therapeutic relationship and Positive coping skills or problem solving skills  Continued Clinical Symptoms:  Severe Anxiety and/or Agitation Depression:   Impulsivity Alcohol/Substance Abuse/Dependencies  Cognitive Features That Contribute To Risk:  None    Suicide Risk:  Minimal: No identifiable suicidal ideation.  Patients presenting with no risk factors but with morbid ruminations; may be classified as minimal risk based on the severity of the depressive symptoms  Follow-up Information    [facility] Follow up on 05/01/2017.   Specialty:  Behavioral Health Why:  Hospital follow-up on Wed 3/6 at 8:00AM. Please bring: picture ID, medicaid card, and hospital discharge paperwork. Thank you.  Contact information: [address] [place] [address] [phone]         Subjective Data:  Chris Olson is a 27 y/o M with history of MDD and substance abuse who was admitted with worsening depression, anxiety, and overdose of his prescription medication of trazodone combined with alcohol. Pt had been discharged from [facility] on 2/10 with plan for outpatient follow up, but he did not go to his follow up appointment at [facility]. Pt had reported that his overdose was not an attempt to end his life but rather to numb himself and "get high" when he combined his medications with alcohol provided by friends. Pt was restarted on previous discharge medications of zoloft and seroquel, and he reported incremental improvement of his presenting mood symptoms.  Today upon evaluation, pt reports that he is  doing well overall. He shares his improved insights during this stay, stating, "I just want to build me a good support group, so that I can reach and call on someone if I'm having trouble." He asserts that his plan is to pursue sobriety and he is in agreement to follow up  at [facility]. He denies SI/HI/AH/VH. He has no physical complaints today. He feels that he is tolerating his medications well. He is sleeping well. His appetite is good. He is in agreement to continue his current treatment plan without changes. He was able to engage in safety planning including plan to [facility] or contact emergency services if he feels unable to maintain his own safety or the safety of others. Pt had no further questions, comments, or concerns.    Plan Of Care/Follow-up recommendations:   - Discharge to outpatient level of care  -MDD, recurrent, severe, without psychosis             - Continue zoloft 50mg  po qDay             - Continue seroquel 100mg  po qhs  Activity:  as tolerated Diet:  normal Tests:  NA Other:  see above for DC plan  Micheal Likenshristopher T Ahava Kissoon, MD 04/25/2017, 9:36 AM

## 2017-05-21 ENCOUNTER — Other Ambulatory Visit: Payer: Self-pay

## 2017-05-21 ENCOUNTER — Emergency Department (HOSPITAL_COMMUNITY)
Admission: EM | Admit: 2017-05-21 | Discharge: 2017-05-21 | Discharge status: Against Medical Advice | Payer: Medicaid Other | Attending: Emergency Medicine | Admitting: Emergency Medicine

## 2017-05-21 ENCOUNTER — Encounter (HOSPITAL_COMMUNITY): Payer: Self-pay | Admitting: Emergency Medicine

## 2017-05-21 DIAGNOSIS — Z5321 Procedure and treatment not carried out due to patient leaving prior to being seen by health care provider: Secondary | ICD-10-CM | POA: Diagnosis not present

## 2017-05-21 DIAGNOSIS — R109 Unspecified abdominal pain: Secondary | ICD-10-CM | POA: Insufficient documentation

## 2017-05-21 LAB — I-STAT CHEM 8, ED
BUN: 8 mg/dL (ref 6–20)
CREATININE: 0.9 mg/dL (ref 0.61–1.24)
Calcium, Ion: 1.12 mmol/L — ABNORMAL LOW (ref 1.15–1.40)
Chloride: 105 mmol/L (ref 101–111)
Glucose, Bld: 95 mg/dL (ref 65–99)
HCT: 46 % (ref 39.0–52.0)
HEMOGLOBIN: 15.6 g/dL (ref 13.0–17.0)
Potassium: 5 mmol/L (ref 3.5–5.1)
SODIUM: 138 mmol/L (ref 135–145)
TCO2: 28 mmol/L (ref 22–32)

## 2017-05-21 NOTE — ED Provider Notes (Signed)
Patient placed in Quick Look pathway, seen and evaluated   Chief Complaint: Abdominal discomfort  HPI:   Patient states that he wants a wellness check.  States that he might have the stomach bug.  Has had some abdomina discomfort for a week.  Denies n/v/d.  Denies dysuria or penile discharge.  ROS: abdominal pain  Physical Exam:   Gen: No distress  Neuro: Awake and Alert  Skin: Warm    Focused Exam:  PE: Gen: A&O x4 HEENT: PERRL, EOM intact CHEST: RRR, no m/r/g LUNGS: CTAB, no w/r/r ABD: BS x 4, ND/NT EXT: No edema, strong peripheral pulses NEURO: Sensation and strength intact bilaterally  Vague complaints.  Discussed PCP may be more appropriate place for evaluation of subacute problems.  Chem 8 pending per request.   Initiation of care has begun. The patient has been counseled on the process, plan, and necessity for staying for the completion/evaluation, and the remainder of the medical screening examination    Roxy HorsemanBrowning, Anelia Carriveau, Cordelia Poche-C 05/21/17 1520    Pricilla LovelessGoldston, Scott, MD 05/21/17 [phone]

## 2017-05-21 NOTE — ED Triage Notes (Signed)
Pt to ED with c/o abd pain and wants to be checked for STD's   Pt st's he just wants to be checked to make sure he is healthy

## 2017-07-10 ENCOUNTER — Encounter (HOSPITAL_COMMUNITY): Payer: Self-pay

## 2017-07-10 ENCOUNTER — Other Ambulatory Visit: Payer: Self-pay

## 2017-07-10 ENCOUNTER — Inpatient Hospital Stay (HOSPITAL_COMMUNITY)
Admission: AD | Admit: 2017-07-10 | Discharge: 2017-07-16 | DRG: 885 | Disposition: A | Discharge status: Home / Self Care | Payer: Medicaid Other | Source: Intra-hospital | Attending: Psychiatry | Admitting: Psychiatry

## 2017-07-10 ENCOUNTER — Emergency Department (HOSPITAL_COMMUNITY)
Admission: EM | Admit: 2017-07-10 | Discharge: 2017-07-10 | Disposition: A | Discharge status: Other Acute Inpatient Hospital | Payer: Medicaid Other | Attending: Emergency Medicine | Admitting: Emergency Medicine

## 2017-07-10 DIAGNOSIS — F1099 Alcohol use, unspecified with unspecified alcohol-induced disorder: Secondary | ICD-10-CM | POA: Diagnosis not present

## 2017-07-10 DIAGNOSIS — Z630 Problems in relationship with spouse or partner: Secondary | ICD-10-CM | POA: Diagnosis not present

## 2017-07-10 DIAGNOSIS — Z915 Personal history of self-harm: Secondary | ICD-10-CM

## 2017-07-10 DIAGNOSIS — Z79899 Other long term (current) drug therapy: Secondary | ICD-10-CM

## 2017-07-10 DIAGNOSIS — R45851 Suicidal ideations: Secondary | ICD-10-CM | POA: Diagnosis present

## 2017-07-10 DIAGNOSIS — F17210 Nicotine dependence, cigarettes, uncomplicated: Secondary | ICD-10-CM | POA: Diagnosis present

## 2017-07-10 DIAGNOSIS — Z635 Disruption of family by separation and divorce: Secondary | ICD-10-CM | POA: Diagnosis not present

## 2017-07-10 DIAGNOSIS — Z560 Unemployment, unspecified: Secondary | ICD-10-CM | POA: Diagnosis not present

## 2017-07-10 DIAGNOSIS — R450 Nervousness: Secondary | ICD-10-CM | POA: Diagnosis not present

## 2017-07-10 DIAGNOSIS — F1210 Cannabis abuse, uncomplicated: Secondary | ICD-10-CM | POA: Diagnosis present

## 2017-07-10 DIAGNOSIS — F419 Anxiety disorder, unspecified: Secondary | ICD-10-CM | POA: Diagnosis not present

## 2017-07-10 DIAGNOSIS — F332 Major depressive disorder, recurrent severe without psychotic features: Principal | ICD-10-CM | POA: Diagnosis present

## 2017-07-10 DIAGNOSIS — Z818 Family history of other mental and behavioral disorders: Secondary | ICD-10-CM

## 2017-07-10 DIAGNOSIS — F1290 Cannabis use, unspecified, uncomplicated: Secondary | ICD-10-CM | POA: Diagnosis not present

## 2017-07-10 DIAGNOSIS — Z638 Other specified problems related to primary support group: Secondary | ICD-10-CM | POA: Diagnosis not present

## 2017-07-10 DIAGNOSIS — F329 Major depressive disorder, single episode, unspecified: Secondary | ICD-10-CM | POA: Diagnosis present

## 2017-07-10 DIAGNOSIS — F331 Major depressive disorder, recurrent, moderate: Secondary | ICD-10-CM | POA: Insufficient documentation

## 2017-07-10 DIAGNOSIS — G4700 Insomnia, unspecified: Secondary | ICD-10-CM | POA: Diagnosis present

## 2017-07-10 DIAGNOSIS — F4325 Adjustment disorder with mixed disturbance of emotions and conduct: Secondary | ICD-10-CM | POA: Diagnosis present

## 2017-07-10 DIAGNOSIS — F1010 Alcohol abuse, uncomplicated: Secondary | ICD-10-CM | POA: Diagnosis present

## 2017-07-10 DIAGNOSIS — Z9114 Patient's other noncompliance with medication regimen: Secondary | ICD-10-CM | POA: Diagnosis not present

## 2017-07-10 LAB — ACETAMINOPHEN LEVEL

## 2017-07-10 LAB — COMPREHENSIVE METABOLIC PANEL
ALT: 21 U/L (ref 17–63)
ANION GAP: 10 (ref 5–15)
AST: 25 U/L (ref 15–41)
Albumin: 4.7 g/dL (ref 3.5–5.0)
Alkaline Phosphatase: 54 U/L (ref 38–126)
BILIRUBIN TOTAL: 0.4 mg/dL (ref 0.3–1.2)
BUN: 14 mg/dL (ref 6–20)
CHLORIDE: 103 mmol/L (ref 101–111)
CO2: 24 mmol/L (ref 22–32)
Calcium: 9.2 mg/dL (ref 8.9–10.3)
Creatinine, Ser: 0.91 mg/dL (ref 0.61–1.24)
GFR calc Af Amer: >60 mL/min (ref >60)
Glucose, Bld: 94 mg/dL (ref 65–99)
POTASSIUM: 3.6 mmol/L (ref 3.5–5.1)
Sodium: 137 mmol/L (ref 135–145)
TOTAL PROTEIN: 7.7 g/dL (ref 6.5–8.1)

## 2017-07-10 LAB — CBC
HCT: 45.8 % (ref 39.0–52.0)
Hemoglobin: 15.8 g/dL (ref 13.0–17.0)
MCH: 33.5 pg (ref 26.0–34.0)
MCHC: 34.5 g/dL (ref 30.0–36.0)
MCV: 97 fL (ref 78.0–100.0)
PLATELETS: 160 10*3/uL (ref 150–400)
RBC: 4.72 MIL/uL (ref 4.22–5.81)
RDW: 12.2 % (ref 11.5–15.5)
WBC: 11.3 10*3/uL — ABNORMAL HIGH (ref 4.0–10.5)

## 2017-07-10 LAB — RAPID URINE DRUG SCREEN, HOSP PERFORMED
Amphetamines: NOT DETECTED
Barbiturates: NOT DETECTED
Benzodiazepines: NOT DETECTED
COCAINE: NOT DETECTED
OPIATES: NOT DETECTED
Tetrahydrocannabinol: POSITIVE — AB

## 2017-07-10 LAB — SALICYLATE LEVEL

## 2017-07-10 LAB — ETHANOL

## 2017-07-10 MED ORDER — LORAZEPAM 1 MG PO TABS
1.0000 mg | ORAL_TABLET | Freq: Four times a day (QID) | ORAL | Status: DC | PRN
  Administered 2017-07-10: 1 mg via ORAL
  Filled 2017-07-10: qty 1

## 2017-07-10 MED ORDER — SERTRALINE HCL 25 MG PO TABS
25.0000 mg | ORAL_TABLET | Freq: Every day | ORAL | Status: DC
  Administered 2017-07-11: 25 mg via ORAL
  Filled 2017-07-10 (×3): qty 1

## 2017-07-10 MED ORDER — ALUM & MAG HYDROXIDE-SIMETH 200-200-20 MG/5ML PO SUSP
30.0000 mL | ORAL | Status: DC | PRN
Start: 2017-07-10 — End: 2017-07-16

## 2017-07-10 MED ORDER — QUETIAPINE FUMARATE 100 MG PO TABS: 100.0000 mg | ORAL_TABLET | Freq: Every day | ORAL | Status: DC

## 2017-07-10 MED ORDER — HYDROXYZINE HCL 25 MG PO TABS: 25.0000 mg | ORAL_TABLET | Freq: Three times a day (TID) | ORAL | Status: DC | PRN

## 2017-07-10 MED ORDER — NICOTINE POLACRILEX 2 MG MT GUM
2.0000 mg | CHEWING_GUM | OROMUCOSAL | Status: DC | PRN
Start: 2017-07-10 — End: 2017-07-16
  Administered 2017-07-10 – 2017-07-15 (×8): 2 mg via ORAL
  Filled 2017-07-10 (×3): qty 1

## 2017-07-10 MED ORDER — ACETAMINOPHEN 325 MG PO TABS: 650.0000 mg | ORAL_TABLET | Freq: Four times a day (QID) | ORAL | Status: DC | PRN

## 2017-07-10 MED ORDER — SERTRALINE HCL 50 MG PO TABS
25.0000 mg | ORAL_TABLET | Freq: Every day | ORAL | Status: DC
  Administered 2017-07-10: 25 mg via ORAL
  Filled 2017-07-10: qty 1

## 2017-07-10 MED ORDER — QUETIAPINE FUMARATE 100 MG PO TABS
100.0000 mg | ORAL_TABLET | Freq: Every day | ORAL | Status: DC
  Administered 2017-07-10 – 2017-07-15 (×6): 100 mg via ORAL
  Filled 2017-07-10 (×8): qty 1

## 2017-07-10 MED ORDER — TRAZODONE HCL 50 MG PO TABS
50.0000 mg | ORAL_TABLET | Freq: Every evening | ORAL | Status: DC | PRN
  Filled 2017-07-10: qty 1

## 2017-07-10 MED ORDER — MAGNESIUM HYDROXIDE 400 MG/5ML PO SUSP: 30.0000 mL | Freq: Every day | ORAL | Status: DC | PRN

## 2017-07-10 MED ORDER — LORAZEPAM 1 MG PO TABS: 1.0000 mg | ORAL_TABLET | Freq: Four times a day (QID) | ORAL | Status: DC | PRN

## 2017-07-10 NOTE — ED Triage Notes (Signed)
Pt is voluntarily with [facility] for suicidal thoughts , he plans to overdose on rat poisoning, he has been self medicating with alcohol Pt decided to call [facility] to come in for help

## 2017-07-10 NOTE — Progress Notes (Addendum)
Patient given ativan 1 mg for severe anxiety.  Patient stated he drinks beer in the morning and liquor if he has the money.  Lost job 3 weeks ago, worked at [employer].  THC "alittle now and then.  Use to be bad on cocaine, last used 3 weeks ago."  Patient separated from wife who takes care of two children 69 and 29 years old.  Wife moved on divorcing, it hurts him.  Wife got tired of his drinking.  SI off/on, denied SI at this time, not while at [facility], contracts for safety.  Came here to get help.  Denied HI and then said he has HI thoughts, hates his wife.  They argue a lot.  She came to [facility] and didn't bring the kids for him to see.  Denied A/V hallucinations at this time. Respirations even and unlabored.  No signs/symptoms of pain/distress noted on patient's face/body movements.  Safety maintained with 15 minute checks. Patient refused pain medication.

## 2017-07-10 NOTE — ED Notes (Signed)
Patient reports suicidal thoughts , he plans to overdose on rat poisoning. Patient denies HI/AVH at this time. Plan of care discussed. Encouragement and support provided and safety maintain. Q 15 min safety checks in place and video monitoring.

## 2017-07-10 NOTE — BH Assessment (Signed)
Assessment Note  Chris Olson is an 27 y.o. male. Pt reports SI with no plan. Pt denies HI and AVH. Pt states he has been diagnosed with anxiety and Schizophrenia. Pt admits to not taking his medications. Pt states he was previously prescribed medication from [facility] but he has not been in "a couple of months." Pt states he self-medicates with alcohol and marijuana. Pt states he drinks "a lot" of alcohol depending on how much money he has. Pt reports occasional marijuana use.   Dr. Sharma Covert and Jacki Cones, NP recommend inpatient treatment. Pt accepted to [facility]  Diagnosis:  F33.1 MDD, Moderate  Past Medical History:  Past Medical History:  Diagnosis Date  . Chest pain   . Pericarditis     History reviewed. No pertinent surgical history.  Family History: History reviewed. No pertinent family history.  Social History:  reports that he has been smoking cigarettes.  He has never used smokeless tobacco. He reports that he drinks alcohol. He reports that he has current or past drug history. Drug: Marijuana.  Additional Social History:  Alcohol / Drug Use Pain Medications: please see mar Prescriptions: please see mar Over the Counter: please see mar History of alcohol / drug use?: No history of alcohol / drug abuse Longest period of sobriety (when/how long): NA  CIWA: CIWA-Ar BP: 121/86 Pulse Rate: 64 COWS:    Allergies: No Known Allergies  Home Medications:  (Not in a hospital admission)  OB/GYN Status:  No LMP for male patient.  General Assessment Data [facility] of [facility]: [facility] TTS Assessment: In system Is this a Tele or Face-to-Face Assessment?: Face-to-Face Is this an Initial Assessment or a Re-assessment for this encounter?: Initial Assessment Marital status: Single Maiden name: NA Is patient pregnant?: No Pregnancy Status: No Living Arrangements: Non-relatives/Friends Can pt return to current living arrangement?: Yes Admission Status: Voluntary Is patient capable of  signing voluntary admission?: Yes Referral Source: Self/Family/Friend Insurance type: Medicaid     Crisis Care Plan Living Arrangements: Non-relatives/Friends Legal Guardian: Other:(self) Name of Psychiatrist: NA Name of Therapist: NA  Education Status Is patient currently in school?: No Is the patient employed, unemployed or receiving disability?: Receiving disability income  Risk to self with the past 6 months Suicidal Ideation: Yes-Currently Present Has patient been a risk to self within the past 6 months prior to admission? : Yes Suicidal Intent: No Has patient had any suicidal intent within the past 6 months prior to admission? : No Is patient at risk for suicide?: Yes Suicidal Plan?: No Has patient had any suicidal plan within the past 6 months prior to admission? : No Access to Means: No What has been your use of drugs/alcohol within the last 12 months?: alcohol and marijuana Previous Attempts/Gestures: No How many times?: 0 Other Self Harm Risks: NA Triggers for Past Attempts: None known Intentional Self Injurious Behavior: None Family Suicide History: No Recent stressful life event(s): Other (Comment)(stop taking medication) Persecutory voices/beliefs?: No Depression: Yes Depression Symptoms: Isolating, Fatigue, Loss of interest in usual pleasures, Feeling worthless/self pity Substance abuse history and/or treatment for substance abuse?: No Suicide prevention information given to non-admitted patients: Not applicable  Risk to Others within the past 6 months Homicidal Ideation: No Does patient have any lifetime risk of violence toward others beyond the six months prior to admission? : No Thoughts of Harm to Others: No Current Homicidal Intent: No Current Homicidal Plan: No Access to Homicidal Means: No Identified Victim: NA History of harm to others?: No Assessment of  Violence: None Noted Violent Behavior Description: NA Does patient have access to weapons?:  No Criminal Charges Pending?: No Does patient have a court date: No Is patient on probation?: No  Psychosis Hallucinations: None noted Delusions: None noted  Mental Status Report Appearance/Hygiene: Unremarkable Eye Contact: Fair Motor Activity: Freedom of movement Speech: Logical/coherent Level of Consciousness: Alert Mood: Depressed Affect: Depressed Anxiety Level: Minimal Thought Processes: Coherent, Relevant Judgement: Unimpaired Orientation: Person, Place, Time, Situation Obsessive Compulsive Thoughts/Behaviors: None  Cognitive Functioning Concentration: Normal Memory: Recent Intact, Remote Intact Is patient IDD: No Is patient DD?: No Insight: Fair Impulse Control: Fair Appetite: Fair Have you had any weight changes? : No Change Sleep: No Change Total Hours of Sleep: 8 Vegetative Symptoms: None  ADLScreening [facility] Assessment Services) Patient's cognitive ability adequate to safely complete daily activities?: Yes Patient able to express need for assistance with ADLs?: Yes Independently performs ADLs?: Yes (appropriate for developmental age)  Prior Inpatient Therapy Prior Inpatient Therapy: Yes Prior Therapy Dates: april 2019 Prior Therapy Facilty/Provider(s): [facility] Reason for Treatment: depression  Prior Outpatient Therapy Prior Outpatient Therapy: Yes Prior Therapy Dates: unknown Prior Therapy Facilty/Provider(s): unknown Reason for Treatment: depression Does patient have an ACCT team?: No Does patient have Intensive In-House Services?  : No Does patient have [facility] services? : No Does patient have P4CC services?: No  ADL Screening (condition at time of admission) Patient's cognitive ability adequate to safely complete daily activities?: Yes Is the patient deaf or have difficulty hearing?: No Does the patient have difficulty seeing, even when wearing glasses/contacts?: No Does the patient have difficulty concentrating, remembering, or making decisions?:  No Patient able to express need for assistance with ADLs?: Yes Does the patient have difficulty dressing or bathing?: No Independently performs ADLs?: Yes (appropriate for developmental age) Does the patient have difficulty walking or climbing stairs?: No       Abuse/Neglect Assessment (Assessment to be complete while patient is alone) Abuse/Neglect Assessment Can Be Completed: Yes Physical Abuse: Denies Verbal Abuse: Denies Sexual Abuse: Denies Exploitation of patient/patient's resources: Denies     Merchant navy officer (For Healthcare) Does Patient Have a Medical Advance Directive?: No Would patient like information on creating a medical advance directive?: No - Patient declined    Additional Information 1:1 In Past 12 Months?: No CIRT Risk: No Elopement Risk: No Does patient have medical clearance?: Yes     Disposition:  Disposition Initial Assessment Completed for this Encounter: Yes  On Site Evaluation by:   Reviewed with Physician:    Emmit Pomfret 07/10/2017 8:44 AM

## 2017-07-10 NOTE — Progress Notes (Signed)
Patient ID: Chris Olson, male   DOB: 1990/06/11, 27 y.o.   MRN: [id number] Patient reports being depressed with suicidal ideations but no intent to act on suicidal thoughts.  Patient also reports drinking copious amounts of alcohol daily and anticipates withdrawal from alcohol use.  Patient stressors are not being able to see his kids and loosing his job recently.  Patient has passive suicidal thoughts, but has future goals of wanting to see his kids.    Patient spoke of various hypothetical ways to commit suicide but stated they were just hypothetical.   Skin assessment complete and patient found to be free injury and contraband.

## 2017-07-10 NOTE — ED Notes (Signed)
Pt given a sandwich and a milk

## 2017-07-10 NOTE — ED Provider Notes (Signed)
[facility]-EMERGENCY DEPT Provider Note   CSN: [id number] Arrival date & time: 07/10/17  0400     History   Chief Complaint Chief Complaint  Patient presents with  . Suicidal    HPI Jarriel Ketner is a 27 y.o. male.  Patient is a 27 year old male with history of depression, ADHD.  He presents today for evaluation of suicidal ideation and depressed mood.  This is been worsening over the past several weeks.  He describes relationships with family and recent divorce from his wife as stressors.  He states he feels as though he "does not want to be here anymore".  He denies plan.  He reports marijuana use and occasional alcohol use.  The history is provided by the patient.    Past Medical History:  Diagnosis Date  . Chest pain   . Pericarditis     Patient Active Problem List   Diagnosis Date Noted  . MDD (major depressive disorder), recurrent episode, severe (HCC) 04/22/2017  . 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 Accidental drug overdose     History reviewed. No pertinent surgical history.      Home Medications    Prior to Admission medications   Medication Sig Start Date End Date Taking? Authorizing Provider  nicotine polacrilex (NICORETTE) 2 MG gum Take 1 each (2 mg total) by mouth as needed for smoking cessation. (May purchase from over the counter at the pharmacy): For smoking cessation 04/25/17   Armandina Stammer I, NP  QUEtiapine (SEROQUEL) 100 MG tablet Take 1 tablet (100 mg total) by mouth at bedtime. For mood control 04/25/17   Armandina Stammer I, NP  sertraline (ZOLOFT) 25 MG tablet Take 1 tablet (25 mg total) by mouth daily. For depression 04/26/17   Sanjuana Kava, NP    Family History History reviewed. No pertinent family history.  Social History Social History   Tobacco Use  . Smoking status: Current Every Day Smoker    Types: Cigarettes  . Smokeless tobacco: Never Used  Substance Use Topics    . Alcohol use: Yes    Comment: social use -- BAC was clear  . Drug use: Yes    Types: Marijuana    Comment: UDS was clear     Allergies   Patient has no known allergies.   Review of Systems Review of Systems  All other systems reviewed and are negative.    Physical Exam Updated Vital Signs BP 121/86 (BP Location: Left Arm)   Pulse 64   Temp 97.9 F (36.6 C) (Oral)   Resp 18   Ht  (1.727 m)   Wt 63.5 kg (140 lb)   SpO2 100%   BMI 21.29 kg/m   Physical Exam  Constitutional: He is oriented to person, place, and time. He appears well-developed and well-nourished. No distress.  HENT:  Head: Normocephalic and atraumatic.  Mouth/Throat: Oropharynx is clear and moist.  Neck: Normal range of motion. Neck supple.  Cardiovascular: Normal rate and regular rhythm. Exam reveals no friction rub.  No murmur heard. Pulmonary/Chest: Effort normal and breath sounds normal. No respiratory distress. He has no wheezes. He has no rales.  Abdominal: Soft. Bowel sounds are normal. He exhibits no distension. There is no tenderness.  Musculoskeletal: Normal range of motion. He exhibits no edema.  Neurological: He is alert and oriented to person, place, and time. Coordination normal.  Skin: Skin is warm and dry. He is not diaphoretic.  Psychiatric: His  speech is delayed. He is withdrawn. He exhibits a depressed mood. He expresses suicidal ideation. He expresses no homicidal ideation. He expresses no suicidal plans and no homicidal plans.  Nursing note and vitals reviewed.    ED Treatments / Results  Labs (all labs ordered are listed, but only abnormal results are displayed) Labs Reviewed  COMPREHENSIVE METABOLIC PANEL  ETHANOL  SALICYLATE LEVEL  ACETAMINOPHEN LEVEL  CBC  RAPID URINE DRUG SCREEN, HOSP PERFORMED    EKG None  Radiology No results found.  Procedures Procedures (including critical care time)  Medications Ordered in ED Medications - No data to  display   Initial Impression / Assessment and Plan / ED Course  I have reviewed the triage vital signs and the nursing notes.  Pertinent labs & imaging results that were available during my care of the patient were reviewed by me and considered in my medical decision making (see chart for details).  Patient presents with depression and suicidal ideation.  He appears medically cleared for evaluation for TTS.  They will determine the final disposition.  Final Clinical Impressions(s) / ED Diagnoses   Final diagnoses:  None    ED Discharge Orders    None       Geoffery Lyons, MD 07/10/17 0530

## 2017-07-10 NOTE — BH Assessment (Signed)
[facility] Assessment Progress Note  Per Juanetta Beets, DO, this pt requires psychiatric hospitalization at this time.  Malva Limes, RN, [facility] has assigned pt to [facility] Rm [id number]; [facility] will be ready to receive pt between 13:30 and 13:45.  Pt has signed Voluntary Admission and Consent for Treatment, as well as Consent to Release Information to pt's estranged wife, and signed forms have been faxed to [facility].  Pt's nurse, Lincoln Maxin, has been notified, and agrees to send original paperwork along with pt via Juel Burrow, and to call report to [phone].  Doylene Canning, [place] [facility] Coordinator [phone]

## 2017-07-10 NOTE — Tx Team (Signed)
Initial Treatment Plan 07/10/2017 4:58 PM Ervie Willets RUE:[id number]    PATIENT STRESSORS: Marital or family conflict Substance abuse   PATIENT STRENGTHS: Wellsite geologist fund of knowledge Motivation for treatment/growth   PATIENT IDENTIFIED PROBLEMS: :I am always thinking of ways to kill myself   I am tired of being depressed and thinking about suicide.                    DISCHARGE CRITERIA:  Improved stabilization in mood, thinking, and/or behavior Reduction of life-threatening or endangering symptoms to within safe limits Withdrawal symptoms are absent or subacute and managed without 24-hour nursing intervention  PRELIMINARY DISCHARGE PLAN: Attend 12-step recovery group Return to previous living arrangement  PATIENT/FAMILY INVOLVEMENT: This treatment plan has been presented to and reviewed with the patient, Chris Olson, and/or family member,   The patient and family have been given the opportunity to ask questions and make suggestions.  Jerrye Bushy, RN 07/10/2017, 4:58 PM

## 2017-07-10 NOTE — ED Notes (Signed)
Bed: [facility] Expected date:  Expected time:  Means of arrival:  Comments: 

## 2017-07-11 ENCOUNTER — Other Ambulatory Visit: Payer: Self-pay

## 2017-07-11 DIAGNOSIS — G4700 Insomnia, unspecified: Secondary | ICD-10-CM

## 2017-07-11 DIAGNOSIS — Z630 Problems in relationship with spouse or partner: Secondary | ICD-10-CM

## 2017-07-11 DIAGNOSIS — F17210 Nicotine dependence, cigarettes, uncomplicated: Secondary | ICD-10-CM

## 2017-07-11 DIAGNOSIS — F419 Anxiety disorder, unspecified: Secondary | ICD-10-CM

## 2017-07-11 DIAGNOSIS — R450 Nervousness: Secondary | ICD-10-CM

## 2017-07-11 DIAGNOSIS — Z9114 Patient's other noncompliance with medication regimen: Secondary | ICD-10-CM

## 2017-07-11 DIAGNOSIS — R45851 Suicidal ideations: Secondary | ICD-10-CM

## 2017-07-11 DIAGNOSIS — Z635 Disruption of family by separation and divorce: Secondary | ICD-10-CM

## 2017-07-11 DIAGNOSIS — F1099 Alcohol use, unspecified with unspecified alcohol-induced disorder: Secondary | ICD-10-CM

## 2017-07-11 DIAGNOSIS — Z560 Unemployment, unspecified: Secondary | ICD-10-CM

## 2017-07-11 DIAGNOSIS — F1290 Cannabis use, unspecified, uncomplicated: Secondary | ICD-10-CM

## 2017-07-11 DIAGNOSIS — Z638 Other specified problems related to primary support group: Secondary | ICD-10-CM

## 2017-07-11 DIAGNOSIS — F332 Major depressive disorder, recurrent severe without psychotic features: Principal | ICD-10-CM

## 2017-07-11 MED ORDER — ONDANSETRON 4 MG PO TBDP: 4.0000 mg | ORAL_TABLET | Freq: Four times a day (QID) | ORAL | Status: AC | PRN

## 2017-07-11 MED ORDER — THIAMINE HCL 100 MG/ML IJ SOLN
100.0000 mg | Freq: Once | INTRAMUSCULAR | Status: AC
  Administered 2017-07-11: 100 mg via INTRAMUSCULAR
  Filled 2017-07-11: qty 2

## 2017-07-11 MED ORDER — VITAMIN B-1 100 MG PO TABS
100.0000 mg | ORAL_TABLET | Freq: Every day | ORAL | Status: DC
  Administered 2017-07-12 – 2017-07-16 (×5): 100 mg via ORAL
  Filled 2017-07-11 (×7): qty 1

## 2017-07-11 MED ORDER — LOPERAMIDE HCL 2 MG PO CAPS: 2.0000 mg | ORAL_CAPSULE | ORAL | Status: AC | PRN

## 2017-07-11 MED ORDER — CHLORDIAZEPOXIDE HCL 25 MG PO CAPS
25.0000 mg | ORAL_CAPSULE | Freq: Four times a day (QID) | ORAL | Status: DC | PRN
Start: 2017-07-11 — End: 2017-07-13
  Administered 2017-07-11: 25 mg via ORAL
  Filled 2017-07-11: qty 1

## 2017-07-11 MED ORDER — HYDROXYZINE HCL 25 MG PO TABS
25.0000 mg | ORAL_TABLET | Freq: Four times a day (QID) | ORAL | Status: AC | PRN
Start: 2017-07-11 — End: 2017-07-14
  Administered 2017-07-12 – 2017-07-13 (×2): 25 mg via ORAL
  Filled 2017-07-11 (×2): qty 1

## 2017-07-11 MED ORDER — SERTRALINE HCL 50 MG PO TABS
50.0000 mg | ORAL_TABLET | Freq: Every day | ORAL | Status: DC
  Administered 2017-07-12 – 2017-07-16 (×5): 50 mg via ORAL
  Filled 2017-07-11 (×7): qty 1

## 2017-07-11 NOTE — Plan of Care (Signed)
  Problem: Safety: Goal: Periods of time without injury will increase Outcome: Progressing   Problem: Medication: Goal: Compliance with prescribed medication regimen will improve Outcome: Progressing  DAR NOTE: Patient presents with anxious affect and mood.  Denies suicidal thoughts, pain, auditory and visual hallucinations.  Reports withdrawal symptoms of cravings and chilling on self inventory form.  Described energy level as normal and concentration as good.  Rates depression at 1, hopelessness at 1, and anxiety at 1.  Maintained on routine safety checks.  Medications given as prescribed.  Support and encouragement offered as needed.  Attended group and participated.  States goal for today is "continue with treatment and look for resources."  Patient observed socializing with peers in the dayroom.  Offered no complaint.

## 2017-07-11 NOTE — [facility] Suicide Risk Assessment (Addendum)
[facility] Admission Suicide Risk Assessment   Nursing information obtained from:  Patient Demographic factors:  Male, Unemployed Current Mental Status:  NA Loss Factors:  Decrease in vocational status, Loss of significant relationship Historical Factors:  Prior suicide attempts Risk Reduction Factors:  Responsible for children under 29 years of age, Living with another person, especially a relative, Positive social support  Total Time spent with patient: 45 minutes Principal Problem:  MDD, Alcohol Use Disorder  Diagnosis:   Patient Active Problem List   Diagnosis Date Noted  . MDD (major depressive disorder), recurrent severe, without psychosis (HCC) [F33.2] 07/10/2017  . MDD (major depressive disorder), recurrent episode, severe (HCC) [F33.2] 04/22/2017  . Adjustment disorder with mixed disturbance of emotions and conduct [F43.25] 04/18/2017  . Accidental drug overdose [T50.901A]     Continued Clinical Symptoms:  Alcohol Use Disorder Identification Test Final Score (AUDIT): 37 The "Alcohol Use Disorders Identification Test", Guidelines for Use in Primary Care, Second Edition.  World Science writer [facility]). Score between 0-7:  no or low risk or alcohol related problems. Score between 8-15:  moderate risk of alcohol related problems. Score between 16-19:  high risk of alcohol related problems. Score 20 or above:  warrants further diagnostic evaluation for alcohol dependence and treatment.   CLINICAL FACTORS:  27 year old male, separated, has two children who are with their mom, has been staying with a friend .Unemployed, on disability.  Reports worsening depression with suicidal ideations, with thoughts of overdosing or ingesting rat poison. Reports anhedonia, low energy, SI, denies changes in sleep or appetite. Denies psychotic symptoms. Identifies separation, being unable to see his children, poor support network as significant stressors.  Reports he is drinking almost daily, in  variable amounts, sometimes more than 12 beers per day. Reports occasional cannabis use . Admission UDS positive for cannabis , admission BAL negative  States he stopped taking psychiatric medications ( Seroquel, Zoloft ) several weeks ago.  Patient reports history of depression, history of several prior psychiatric admissions, most recently here at [facility] in 2/19 for depression and substance abuse . Reports history of alcohol and cannabis abuse, denies history of seizures or DTs.   Denies medical illnesses. NKDA.  Of note, no current tremors or diaphoresis, vitals stable   Dx- Alcohol Use Disorder, MDD versus Substance Induced Mood Disorder  Plan- Inpatient admission. Restart Zoloft 50 mgrs QDAY , Seroquel 100 mgrs QHS Librium PRN for potential alcohol WDL if needed       Musculoskeletal: Strength & Muscle Tone: within normal limits no tremors, no diaphoresis Gait & Station: normal Patient leans: N/A  Psychiatric Specialty Exam: Physical Exam  ROS no headache , no visual disturbances, no chest pain, no shortness of breath, no vomiting   Blood pressure 123/78, pulse 77, temperature 98.4 F (36.9 C), temperature source Oral, resp. rate 18, height  (1.727 m), weight 62.1 kg (137 lb), SpO2 100 %.Body mass index is 20.83 kg/m.  General Appearance: Fairly Groomed  Eye Contact:  Fair  Speech:  Normal Rate  Volume:  Normal  Mood:  Depressed and Dysphoric  Affect:  restricted, vaguely irritable  Thought Process:  Linear and Descriptions of Associations: Intact  Orientation:  Other:  fully alert and attentive  Thought Content:  no hallucinations, no delusions   Suicidal Thoughts:  No denies current suicidal or self injurious ideations, endorses vague thoughts of " hurting" his wife ( currently separated ) but states " I am not going to do anything, it is just  a thought".  Homicidal Thoughts:  No  Memory:  recent and remote grossly intact   Judgement:  Fair  Insight:  Fair   Psychomotor Activity:  Normal- no tremors, no diaphoresis, no restlessness   Concentration:  Concentration: Fair and Attention Span: Fair  Recall:  Good  Fund of Knowledge:  Good  Language:  Good  Akathisia:  Negative  Handed:  Right  AIMS (if indicated):     Assets:  Communication Skills Desire for Improvement Resilience  ADL's:  Intact  Cognition:  WNL  Sleep:  Number of Hours: 6.25      COGNITIVE FEATURES THAT CONTRIBUTE TO RISK:  Closed-mindedness and Loss of executive function    SUICIDE RISK:   Moderate:  Frequent suicidal ideation with limited intensity, and duration, some specificity in terms of plans, no associated intent, good self-control, limited dysphoria/symptomatology, some risk factors present, and identifiable protective factors, including available and accessible social support.  PLAN OF CARE: Patient will be admitted to inpatient psychiatric unit for stabilization and safety. Will provide and encourage milieu participation. Provide medication management and maked adjustments as needed.  Will also provide medications to manage possible alcohol WDL if needed . Will follow daily.    I certify that inpatient services furnished can reasonably be expected to improve the patient's condition.   Craige Cotta, MD 07/11/2017, 9:24 AM

## 2017-07-11 NOTE — [facility] Counselor (Signed)
Adult Comprehensive Assessment  Patient ID: Chris Olson, male   DOB: 1990/10/26, 27 y.o.   MRN: [id number]  Information source: Patient  Current Stressors: Educational / Learning stressors: Denies Employment / Job issues: On disability; "i've never been able to hold down a job." Family Relationships: Strained relationship with his wife, wife is asking for divorce. Close to his 2 kids; "I haven't spoken to my mother in months."  Financial / Lack of resources (include bankruptcy): [employer] and Disability income Housing / Lack of housing: Living with his father temporarily; Reports he does not know if he will return (his choice) Physical health (include injuries & life threatening diseases): Denies Social relationships:Patient reports having a poor social support network.  Substance abuse: Patient reports drinking 1-2 beers occasionally, also reports smoking cannabis and cocaine "every-now-and-then" Bereavement / Loss: Loss of marriage. "I'm having trouble accepting that my wife wants a divorce when I've been trying so hard to make things right."  Living/Environment/Situation: Living Arrangements: Temporarily lives with his father Living conditions (as described by patient or guardian): "Okay" How long has patient lived in current situation?: 3 months What is atmosphere in current home: Temporary  Family History: Marital status: Separated Separated, when?: few days ago What types of issues is patient dealing with in the relationship?: Pt reports that his wife wants a divorce. "I don't know what the problem is but I feel like it's me." Additional relationship information: n/a  Are you sexually active?: Yes What is your sexual orientation?: heterosexual Has your sexual activity been affected by drugs, alcohol, medication, or emotional stress?: n/a  Does patient have children?: Yes How many children?: 2 How is patient's relationship with their children?: 8yo boy and 6yo  girl. "I see them everyday. We have a good relationship."  Childhood History: By whom was/is the patient raised?: Both parents Additional childhood history information: Mom and dad were married until patient turned 33. "My childhood was okay. My parents were controlling."  Description of patient's relationship with caregiver when they were a child: close to mother; close to father Patient's description of current relationship with people who raised him/her: no relationship with mother currently; strained/fair relationship with father. "He tries to help me out but I really don't feel like I can talk to him."  How were you disciplined when you got in trouble as a child/adolescent?: n/a  Does patient have siblings?: Yes Number of Siblings: 2 Description of patient's current relationship with siblings: one brother and one sister. "My brother is very depressed." close to both siblings.  Did patient suffer any verbal/emotional/physical/sexual abuse as a child?: No Did patient suffer from severe childhood neglect?: No Has patient ever been sexually abused/assaulted/raped as an adolescent or adult?: No Was the patient ever a victim of a crime or a disaster?: No  Education: Highest grade of school patient has completed: 12th grade Currently a student?: No Name of school: NA Learning disability?: No  Employment/Work Situation: Employment situation: On disability Why is patient on disability: schizophrenia/mental illness How long has patient been on disability: few years Patient's job has been impacted by current illness: Yes Describe how patient's job has been impacted: unable to [employer] employment due to "thoughts getting jumpled in my head and agitation."  What is the longest time patient has a held a job?: 2 months in high school Where was the patient employed at that time?: [employer]  Has patient ever been in the [employer]?: No Has patient ever served in combat?: No Did You  Receive  Any Psychiatric Treatment/Services While in the Military?: No Are There Guns or Other Weapons in Your Home?: No Are These Weapons Safely Secured?: (n/a)  Financial Resources: Financial resources: [employer], Medicaid, Support from parents / caregiver Does patient have a Lawyer or guardian?: No  Alcohol/Substance Abuse: What has been your use of drugs/alcohol within the last 12 months?:Patient reports history of intermittent alcohol and marijuana use.  If attempted suicide, did drugs/alcohol play a role in this?: No(pt reports that he was sober when he felt suicidal) Alcohol/Substance Abuse Treatment Hx: Denies past history, Past Tx, Outpatient If yes, describe treatment: [facility] history but has not gone to appt or taken psychiatric medications in over a month.  Has alcohol/substance abuse ever caused legal problems?: No  Social Support System: Forensic psychologist System: Poor Describe Community Support System: Patient denies positive social supports Type of faith/religion: Christian How does patient's faith help to cope with current illness?: prayer; Recruitment consultant: Leisure and Hobbies: "Spending time with my kids"  Strengths/Needs: What things does the patient do well?: "I don't know." In what areas does patient struggle / problems for patient: coping with my wife wanting a divorce; lonliness; medication compliance; building a support network  Discharge Plan: Does patient have access to transportation?: Yes(bus pass) Will patient be returning to same living situation after discharge?: Patient reports he does know if he will return to his father's home or if he will get a hotel room at discharge. CSW will continue to follow.  Currently receiving community mental health services: No If no, would patient like referral for services when discharged?: Yes ([place]) Does patient have financial barriers related to discharge  medications?: No    Summary/Recommendations:   Summary and Recommendations (to be completed by the evaluator): Sedrick is a 27 year old male who is diagnosed with Major Depressive disorder, Moderate. He presented to the hospital seeking treatment for suicidal ideation and worsening depressive symptoms. During the assessment, Skyler was slighty irritable, however he was cooperative with providing information. My reports that he does not want any residential treatment at discharge, however he would like to be provided with resources for halfway and transitioning housing options. Viral reports he will continue to follow up with [facility] at discharge. Pamela can benefit from crisis stabilization, medication management, therapeutic milieu and referral services.   Maeola Sarah. 07/11/2017

## 2017-07-11 NOTE — [facility] Group Notes (Signed)
Adult Psychoeducational Group Note  Date:  07/11/2017 Time:  10:29 PM  Group Topic/Focus:  Wrap-Up Group:   The focus of this group is to help patients review their daily goal of treatment and discuss progress on daily workbooks.  Participation Level:  Active  Participation Quality:  Appropriate and Attentive  Affect:  Appropriate  Cognitive:  Alert and Appropriate  Insight: Appropriate and Good  Engagement in Group:  Engaged  Modes of Intervention:  Discussion and Education  Additional Comments:  Pt attended and participated in wrap up group this evening. Pt had an "okay day" and they have done a lot of pacing back and forth, having a lot of feelings. Pt goal while they are here, is to get better mentally and to stop having "crazy" thoughts. Pt feels alone due to them not having any family here.   Chris Olson 07/11/2017, 10:29 PM

## 2017-07-11 NOTE — [facility] Group Notes (Signed)
LCSW Group Therapy Note  07/11/2017 1:15pm  Type of Therapy/Topic:  Group Therapy:  Feelings about Diagnosis  Participation Level:  Active   Description of Group:   This group will allow patients to explore their thoughts and feelings about diagnoses they have received. Patients will be guided to explore their level of understanding and acceptance of these diagnoses. Facilitator will encourage patients to process their thoughts and feelings about the reactions of others to their diagnosis and will guide patients in identifying ways to discuss their diagnosis with significant others in their lives. This group will be process-oriented, with patients participating in exploration of their own experiences, giving and receiving support, and processing challenge from other group members.   Therapeutic Goals: 1. Patient will demonstrate understanding of diagnosis as evidenced by identifying two or more symptoms of the disorder 2. Patient will be able to express two feelings regarding the diagnosis 3. Patient will demonstrate their ability to communicate their needs through discussion and/or role play  Summary of Patient Progress:  Jermel was attentive and engaged during today's processing group. He shared that he has been diagnosed with a mental illness "since I was 13." He learned about his mental illness and treatment options. Eliya shared that he has no social supports and was receptive to learning how to make his own support network (AA/NA, [employer]). He continues to show progress in the group setting with improving insight.   Therapeutic Modalities:   Cognitive Behavioral Therapy Brief Therapy Feelings Identification    Ledell Peoples Smart, LCSW 07/11/2017 12:32 PM

## 2017-07-11 NOTE — H&P (Addendum)
Psychiatric Admission Assessment Adult  Patient Identification: Chris Olson  MRN:  [id number]  Date of Evaluation:  07/11/2017  Chief Complaint:   Principal Diagnosis: MDD (major depressive disorder), recurrent episode, severe ([place])  Diagnosis:   Patient Active Problem List   Diagnosis Date Noted  . MDD (major depressive disorder), recurrent episode, severe ([place]) [F33.2] 04/22/2017    Priority: High  . MDD (major depressive disorder), recurrent severe, without psychosis ([place]) [F33.2] 07/10/2017  . Adjustment disorder with mixed disturbance of emotions and conduct [F43.25] 04/18/2017  . Accidental drug overdose [T50.901A]    History of Present Illness: This is yet another admission assessment for this 27 year old AA male with hx of depression & substance use disorder. He is known in this hospital from previous admissions. He was discharged from this hospital on 04-07-17 & 04-25-17 respectively after receiving mood stabilization treatments. Upon bother discharged, he set-up with appointments to follow-up care at [facility] for medication management & routine psychiatric care. He is known to be noncompliant to his treatment regimen.following -up with his outpatient appoints. Chris Olson is being re-admitted to the [facility] this time around with complaints of worsening symptoms of depression & suicidal ideations x several weeks. He cited poor familial relationship & marital separation as his stressor.  (Per Md's admission SRA): 27 year old male, separated, has two children who are with their mom, has been staying with a friend. Unemployed, on disability. Reports worsening depression with suicidal ideations, with thoughts of overdosing or ingesting rat poison. Reports anhedonia, low energy, SI, denies changes in sleep or appetite. Denies psychotic symptoms. Identifies separation, being unable to see his children, poor support network as significant stressors. Reports he is drinking almost daily, in variable  amounts, sometimes more than 12 beers per day. Reports occasional cannabis use . Admission UDS positive for cannabis , admission BAL negative. States he stopped taking psychiatric medications ( Seroquel, Zoloft ) several weeks ago. Patient reports history of depression, history of several prior psychiatric admissions, most recently here at [facility] in 2/19 for depression and substance abuse. Reports history of alcohol and cannabis abuse, denies history of seizures or DTs.   Associated Signs/Symptoms:  Depression Symptoms:  depressed mood, insomnia, feelings of worthlessness/guilt, hopelessness, anxiety,  (Hypo) Manic Symptoms:  Impulsivity, Irritable Mood, Labiality of Mood,  Anxiety Symptoms:  Excessive Worry,  Psychotic Symptoms:  Currently denies any hallucinations, delusional thoughts or paranoia.       PTSD Symptoms: Denies any PTSD symptoms or events.  Total Time spent with patient: 1 hour  Past Psychiatric History: MDD with psychotic features; Anxiety.  Inpatient [facility] outpatient services [facility]).  Has been on Zoloft & Seroquel in the past but doesn't feel that the Zoloft really worked.  Prior outpatient services with [facility]  Is the patient at risk to self? No.  Has the patient been a risk to self in the past 6 months? No.  Has the patient been a risk to self within the distant past? Yes.    Is the patient a risk to others? No.  Has the patient been a risk to others in the past 6 months? No.  Has the patient been a risk to others within the distant past? No.   Prior Inpatient Therapy: Yes, [facility] x multiple times. Prior Outpatient Therapy: Yes [facility]).   Alcohol Screening: 1. How often do you have a drink containing alcohol?: 4 or more times a week 2. How many drinks containing alcohol do you have on a typical day when you  are drinking?: 10 or more 3. How often do you have six or more drinks on one occasion?: Daily or almost daily AUDIT-C Score: 12 4. How often during the  last year have you found that you were not able to stop drinking once you had started?: Daily or almost daily 5. How often during the last year have you failed to do what was normally expected from you becasue of drinking?: Daily or almost daily 6. How often during the last year have you needed a first drink in the morning to get yourself going after a heavy drinking session?: Daily or almost daily 7. How often during the last year have you had a feeling of guilt of remorse after drinking?: Daily or almost daily 8. How often during the last year have you been unable to remember what happened the night before because you had been drinking?: Weekly 9. Have you or someone else been injured as a result of your drinking?: Yes, but not in the last year 10. Has a relative or friend or a doctor or another health worker been concerned about your drinking or suggested you cut down?: Yes, during the last year Alcohol Use Disorder Identification Test Final Score (AUDIT): 37 Intervention/Follow-up: Continued Monitoring  Substance Abuse History in the last 12 months:  Yes.    Consequences of Substance Abuse: Medical Consequences:  Liver damage, Possible death by overdose Legal Consequences:  Arrests, jail time, Loss of driving privilege. Family Consequences:  Family discord, divorce and or separation.  Previous Psychotropic Medications: Yes (Trazodone)   Psychological Evaluations: No   Past Medical History:  Past Medical History:  Diagnosis Date  . Chest pain   . Pericarditis    History reviewed. No pertinent surgical history.  Family History: History reviewed. No pertinent family history.  Family Psychiatric  History: Major depression: Mother & brother.  Tobacco Screening: Have you used any form of tobacco in the last 30 days? (Cigarettes, Smokeless Tobacco, Cigars, and/or Pipes): Yes Tobacco use, Select all that apply: 5 or more cigarettes per day Are you interested in Tobacco Cessation  Medications?: Yes, will notify MD for an order Counseled patient on smoking cessation including recognizing danger situations, developing coping skills and basic information about quitting provided: Refused/Declined practical counseling  Social History:  Social History   Substance and Sexual Activity  Alcohol Use Yes   Comment: social use -- BAC was clear     Social History   Substance and Sexual Activity  Drug Use Yes  . Types: Marijuana   Comment: UDS was clear    Additional Social History:  Allergies:  No Known Allergies  Lab Results:  Results for orders placed or performed during the hospital encounter of 07/10/17 (from the past 48 hour(s))  Comprehensive metabolic panel     Status: None   Collection Time: 07/10/17  4:30 AM  Result Value Ref Range   Sodium 137 135 - 145 mmol/L   Potassium 3.6 3.5 - 5.1 mmol/L   Chloride 103 101 - 111 mmol/L   CO2 24 22 - 32 mmol/L   Glucose, Bld 94 65 - 99 mg/dL   BUN 14 6 - 20 mg/dL   Creatinine, Ser 0.91 0.61 - 1.24 mg/dL   Calcium 9.2 8.9 - 10.3 mg/dL   Total Protein 7.7 6.5 - 8.1 g/dL   Albumin 4.7 3.5 - 5.0 g/dL   AST 25 15 - 41 U/L   ALT 21 17 - 63 U/L   Alkaline Phosphatase 54 38 -  126 U/L   Total Bilirubin 0.4 0.3 - 1.2 mg/dL   GFR calc non Af Amer >60 >60 mL/min   GFR calc Af Amer >60 >60 mL/min    Comment: (NOTE) The eGFR has been calculated using the CKD EPI equation. This calculation has not been validated in all clinical situations. eGFR's persistently <60 mL/min signify possible Chronic Kidney Disease.    Anion gap 10 5 - 15    Comment: Performed at [facility], [place] [address] [place], [place] [address]  Ethanol     Status: None   Collection Time: 07/10/17  4:30 AM  Result Value Ref Range   Alcohol, Ethyl (B) <10 <10 mg/dL    Comment: (NOTE) Lowest detectable limit for serum alcohol is 10 mg/dL. For medical purposes only. Performed at [facility], [place] [address], [place], [place] [address]   Salicylate level     Status: None   Collection Time: 07/10/17  4:30 AM  Result Value Ref Range   Salicylate Lvl <0.2 2.8 - 30.0 mg/dL    Comment: Performed at [facility], [place] [address], [place], [place] [address]  Acetaminophen level     Status: Abnormal   Collection Time: 07/10/17  4:30 AM  Result Value Ref Range   Acetaminophen (Tylenol), Serum <10 (L) 10 - 30 ug/mL    Comment: (NOTE) Therapeutic concentrations vary significantly. A range of 10-30 ug/mL  may be an effective concentration for many patients. However, some  are best treated at concentrations outside of this range. Acetaminophen concentrations >150 ug/mL at 4 hours after ingestion  and >50 ug/mL at 12 hours after ingestion are often associated with  toxic reactions. Performed at [facility], [place] [address] [place], [place] [address]   cbc     Status: Abnormal   Collection Time: 07/10/17  4:30 AM  Result Value Ref Range   WBC 11.3 (H) 4.0 - 10.5 K/uL   RBC 4.72 4.22 - 5.81 MIL/uL   Hemoglobin 15.8 13.0 - 17.0 g/dL   HCT 45.8 39.0 - 52.0 %   MCV 97.0 78.0 - 100.0 fL   MCH 33.5 26.0 - 34.0 pg   MCHC 34.5 30.0 - 36.0 g/dL   RDW 12.2 11.5 - 15.5 %   Platelets 160 150 - 400 K/uL    Comment: Performed at [facility], [place] [address] [place], [place] [address]  Rapid urine drug screen (hospital performed)     Status: Abnormal   Collection Time: 07/10/17  4:45 AM  Result Value Ref Range   Opiates NONE DETECTED NONE DETECTED   Cocaine NONE DETECTED NONE DETECTED   Benzodiazepines NONE DETECTED NONE DETECTED   Amphetamines NONE DETECTED NONE DETECTED   Tetrahydrocannabinol POSITIVE (A) NONE DETECTED   Barbiturates NONE DETECTED NONE DETECTED    Comment: (NOTE) DRUG SCREEN FOR MEDICAL PURPOSES ONLY.  IF CONFIRMATION IS NEEDED FOR ANY PURPOSE, NOTIFY LAB WITHIN 5 DAYS. LOWEST DETECTABLE LIMITS FOR URINE DRUG SCREEN Drug  Class                     Cutoff (ng/mL) Amphetamine and metabolites    1000 Barbiturate and metabolites    200 Benzodiazepine                 878 Tricyclics and metabolites     300 Opiates and metabolites        300 Cocaine and metabolites  300 THC                            50 Performed at [facility], [place] [address] [place], [place] [address]    Blood Alcohol level:  Lab Results  Component Value Date   ETH <10 07/10/2017   ETH 103 (H) 18/56/3149   Metabolic Disorder Labs:  Lab Results  Component Value Date   HGBA1C 5.2 04/05/2017   MPG 102.54 04/05/2017   MPG 94 09/11/2016   No results found for: PROLACTIN Lab Results  Component Value Date   CHOL 165 04/05/2017   TRIG 213 (H) 04/05/2017   HDL 44 04/05/2017   CHOLHDL 3.8 04/05/2017   VLDL 43 (H) 04/05/2017   LDLCALC 78 04/05/2017   Current Medications: Current Facility-Administered Medications  Medication Dose Route Frequency Provider Last Rate Last Dose  . acetaminophen (TYLENOL) tablet 650 mg  650 mg Oral Q6H PRN Ethelene Hal, NP      . alum & mag hydroxide-simeth (MAALOX/MYLANTA) 200-200-20 MG/5ML suspension 30 mL  30 mL Oral Q4H PRN Ethelene Hal, NP      . chlordiazePOXIDE (LIBRIUM) capsule 25 mg  25 mg Oral Q6H PRN Cobos, Myer Peer, MD      . hydrOXYzine (ATARAX/VISTARIL) tablet 25 mg  25 mg Oral Q6H PRN Cobos, Myer Peer, MD      . loperamide (IMODIUM) capsule 2-4 mg  2-4 mg Oral PRN Cobos, Myer Peer, MD      . magnesium hydroxide (MILK OF MAGNESIA) suspension 30 mL  30 mL Oral Daily PRN Ethelene Hal, NP      . nicotine polacrilex (NICORETTE) gum 2 mg  2 mg Oral PRN Cobos, Myer Peer, MD   2 mg at 07/10/17 1834  . ondansetron (ZOFRAN-ODT) disintegrating tablet 4 mg  4 mg Oral Q6H PRN Cobos, Myer Peer, MD      . QUEtiapine (SEROQUEL) tablet 100 mg  100 mg Oral QHS Ethelene Hal, NP   100 mg at 07/10/17 2116  . [START ON 07/12/2017] sertraline  (ZOLOFT) tablet 50 mg  50 mg Oral Daily Cobos, Fernando A, MD      . thiamine (B-1) injection 100 mg  100 mg Intramuscular Once Cobos, Myer Peer, MD      . Derrill Memo ON 07/12/2017] thiamine (VITAMIN B-1) tablet 100 mg  100 mg Oral Daily Cobos, Myer Peer, MD      . traZODone (DESYREL) tablet 50 mg  50 mg Oral QHS PRN Ethelene Hal, NP       PTA Medications: Medications Prior to Admission  Medication Sig Dispense Refill Last Dose  . nicotine polacrilex (NICORETTE) 2 MG gum Take 1 each (2 mg total) by mouth as needed for smoking cessation. (May purchase from over the counter at the pharmacy): For smoking cessation (Patient not taking: Reported on 07/10/2017) 100 tablet 0 Not Taking at Unknown time  . QUEtiapine (SEROQUEL) 100 MG tablet Take 1 tablet (100 mg total) by mouth at bedtime. For mood control (Patient not taking: Reported on 07/10/2017) 30 tablet 0 Not Taking at Unknown time  . sertraline (ZOLOFT) 25 MG tablet Take 1 tablet (25 mg total) by mouth daily. For depression (Patient not taking: Reported on 07/10/2017) 30 tablet 0 Not Taking at Unknown time   Musculoskeletal: Strength & Muscle Tone: within normal limits Gait & Station: normal Patient leans: N/A  Psychiatric Specialty Exam: Physical Exam  Vitals  reviewed. Constitutional: He is oriented to person, place, and time. He appears well-developed and well-nourished.  HENT:  Head: Normocephalic.  Eyes: Pupils are equal, round, and reactive to light.  Neck: Normal range of motion. Neck supple.  Respiratory: Effort normal.  GI: Soft.  Genitourinary:  Genitourinary Comments: Deferred  Musculoskeletal: Normal range of motion.  Neurological: He is alert and oriented to person, place, and time.  Skin: Skin is warm and dry.    Review of Systems  Constitutional: Negative for malaise/fatigue.  HENT: Negative.   Eyes: Negative.  Negative for blurred vision.  Respiratory: Negative.  Negative for cough, shortness of breath and  wheezing.   Cardiovascular: Negative.  Negative for chest pain and palpitations.  Gastrointestinal: Negative.  Negative for abdominal pain, nausea and vomiting.  Genitourinary: Negative.   Musculoskeletal: Negative.   Skin: Negative.   Neurological: Negative.  Negative for tremors and headaches.  Endo/Heme/Allergies: Negative.   Psychiatric/Behavioral: Positive for depression and substance abuse (UDS (+) for THX, Hx. alcoholism, THC use.  ). Negative for hallucinations, memory loss and suicidal ideas. The patient is nervous/anxious and has insomnia.   All other systems reviewed and are negative.   Blood pressure 123/78, pulse 77, temperature 98.4 F (36.9 C), temperature source Oral, resp. rate 18, height 5' 8"  (1.727 m), weight 62.1 kg (137 lb), SpO2 100 %.Body mass index is 20.83 kg/m.  General Appearance: Fairly Groomed  Eye Contact:  Fair  Speech:  Normal Rate  Volume:  Normal  Mood:  Depressed and Dysphoric  Affect:  restricted, vaguely irritable  Thought Process:  Linear and Descriptions of Associations: Intact  Orientation:  Other:  fully alert and attentive  Thought Content:  no hallucinations, no delusions   Suicidal Thoughts:  No denies current suicidal or self injurious ideations, endorses vague thoughts of " hurting" his wife ( currently separated ) but states " I am not going to do anything, it is just a thought".  Homicidal Thoughts:  No  Memory:  recent and remote grossly intact   Judgement:  Fair  Insight:  Fair  Psychomotor Activity:  Normal- no tremors, no diaphoresis, no restlessness   Concentration:  Concentration: Fair and Attention Span: Fair  Recall:  Good  Fund of Knowledge:  Good  Language:  Good  Akathisia:  Negative  Handed:  Right  AIMS (if indicated):     Assets:  Communication Skills Desire for Improvement Resilience  ADL's:  Intact  Cognition:  WNL  Sleep:  Number of Hours: 6.25     Treatment Plan/Recommendations: 1. Admit for crisis  management and stabilization, estimated length of stay 3-5 days.   2. Medication management to reduce current symptoms to base line and improve the patient's overall level of functioning: See MAR, Md's SRA & treatment plan.   Observation Level/Precautions:  15 minute checks  Laboratory:  Per ED [facility]  Psychotherapy: Group sessions  Medications: See H&P.  Consultations:  As needed  Discharge Concerns:  Safety, stabilization, and access to medication  Estimated LOS:  5-7 days  Other: Admit to the 300-Hall.   Physician Treatment Plan for Primary Diagnosis: MDD (major depressive disorder), recurrent episode, severe ([place])  Long Term Goal(s): Improvement in symptoms so as ready for discharge  Short Term Goals: Ability to identify changes in lifestyle to reduce recurrence of condition will improve, Ability to verbalize feelings will improve and Ability to demonstrate self-control will improve  Physician Treatment Plan for Secondary Diagnosis: Principal Problem:   MDD (major  depressive disorder), recurrent episode, severe ([place]) Active Problems:   MDD (major depressive disorder), recurrent severe, without psychosis ([place])  Long Term Goal(s): Improvement in symptoms so as ready for discharge  Short Term Goals: Ability to identify and develop effective coping behaviors will improve, Compliance with prescribed medications will improve and Ability to identify triggers associated with substance abuse/mental health issues will improve  I certify that inpatient services furnished can reasonably be expected to improve the patient's condition.    Lindell Spar, NP, PMHNP, FNP-BC 5/16/201912:47 PM   I have discussed case with NP and have met with patient  Agree with NP note and assessment  27 year old male, separated, has two children who are with their mom, has been staying with a friend .Unemployed, on disability.  Reports worsening depression with suicidal ideations, with thoughts of  overdosing or ingesting rat poison. Reports anhedonia, low energy, SI, denies changes in sleep or appetite. Denies psychotic symptoms. Identifies separation, being unable to see his children, poor support network as significant stressors.  Reports he is drinking almost daily, in variable amounts, sometimes more than 12 beers per day. Reports occasional cannabis use . Admission UDS positive for cannabis , admission BAL negative  States he stopped taking psychiatric medications ( Seroquel, Zoloft ) several weeks ago.  Patient reports history of depression, history of several prior psychiatric admissions, most recently here at [facility] in 2/19 for depression and substance abuse . Reports history of alcohol and cannabis abuse, denies history of seizures or DTs.   Denies medical illnesses. NKDA.  Of note, no current tremors or diaphoresis, vitals stable   Dx- Alcohol Use Disorder, MDD versus Substance Induced Mood Disorder  Plan- Inpatient admission. Restart Zoloft 50 mgrs QDAY , Seroquel 100 mgrs QHS Librium PRN for potential alcohol WDL if needed

## 2017-07-11 NOTE — Progress Notes (Signed)
Pt did attend group with MHT.  Pt actively participated in the activity with his peers. 

## 2017-07-11 NOTE — Progress Notes (Signed)
Pt is new to the unit this afternoon.   At the beginning of the shift, pt was in his room in bed.  His roommate was also in the room and they were talking and laughing.  Pt reports that he feels better since he was admitted to the hospital.  He is able to contract for safety, but says that he still has passive suicidal thoughts that come and go.  He is depressed that he is not able to see his children.  He denies HI/AVH with Clinical research associate.  He denies having any withdrawal symptoms at this time.  Pt was encouraged to make his needs known to staff.  Support and encouragement offered.  Discharge plans are in process.  Safety maintained with q15 minute checks.

## 2017-07-11 NOTE — Progress Notes (Signed)
D   Pt is pleasant and cooperative   He has been found several times in the 400 hall dayroom and tried to go to 400 hall group    He was redirected and said he just likes the people on 400 better than 300   His behavior is appropriate and he is interactive with staff and peers  A   Verbal support given    Medications administered and effectiveness monitored    Q 15 min checks R   Pt is safe at present time

## 2017-07-12 NOTE — Progress Notes (Signed)
Recreation Therapy Notes  Date: 5.17.19 Time: 0930 Location: 300 Hall Dayroom  Group Topic: Stress Management  Goal Area(s) Addresses:  Patient will verbalize importance of using healthy stress management.  Patient will identify positive emotions associated with healthy stress management.   Intervention: Stress Management  Activity :  Progressive Muscle Relaxation.  LRT lead patients through the process of tensing each muscle group then releasing the tension.  Patients were to follow along as LRT read script to guide patients through the process.  Education:  Stress Management, Discharge Planning.   Education Outcome: Acknowledges edcuation/In group clarification offered/Needs additional education  Clinical Observations/Feedback: Pt did not attend group.    [name], LRT/CTRS         [name] A 07/12/2017 12:22 PM

## 2017-07-12 NOTE — Progress Notes (Signed)
Patient ID: Chris Olson, male   DOB: Sep 06, 1990, 27 y.o.   MRN: [id number] D: Assumed care patient @ 2330. Patient in bed sleeping. Respiration regular and unlabored. No sign of distress noted at this time A: 15 mins checks for safety. R: Patient remains safe.

## 2017-07-12 NOTE — Plan of Care (Signed)
  Problem: Activity: Goal: Interest or engagement in activities will improve Outcome: Progressing   Problem: Coping: Goal: Ability to verbalize frustrations and anger appropriately will improve Outcome: Progressing   Problem: Safety: Goal: Periods of time without injury will increase Outcome: Progressing   DAR NOTE: Patient presents with anxious affect and pleasant mood.  Complained of back pain upon waking up which relieved with urination. Denies auditory and visual hallucinations.  Rates depression at 1, hopelessness at 1, and anxiety at 0.  Maintained on routine safety checks.  Medications given as prescribed.  Support and encouragement offered as needed.  States goal for today is "continue to get better."  Patient observed socializing with peers in the dayroom.  Offered no complaint.

## 2017-07-12 NOTE — [facility] Group Notes (Signed)
LCSW Group Therapy Note  07/12/2017 1:15pm  Type of Therapy and Topic:  Group Therapy:  Feelings around Relapse and Recovery  Participation Level:  Active   Description of Group:    Patients in this group will discuss emotions they experience before and after a relapse. They will process how experiencing these feelings, or avoidance of experiencing them, relates to having a relapse. Facilitator will guide patients to explore emotions they have related to recovery. Patients will be encouraged to process which emotions are more powerful. They will be guided to discuss the emotional reaction significant others in their lives may have to their relapse or recovery. Patients will be assisted in exploring ways to respond to the emotions of others without this contributing to a relapse.  Therapeutic Goals: 1. Patient will identify two or more emotions that lead to a relapse for them 2. Patient will identify two emotions that result when they relapse 3. Patient will identify two emotions related to recovery 4. Patient will demonstrate ability to communicate their needs through discussion and/or role plays   Summary of Patient Progress:  Devion was attentive and engaged during today's processing group. He shared that he is worried about relapsing in the future and is wondering about [facility] or some other residential treatment. Leith states that he tends to relapse when depressed or feeling impulsive. He continues to demonstrate limited insight with some progress in the group setting.    Therapeutic Modalities:   Cognitive Behavioral Therapy Solution-Focused Therapy Assertiveness Training Relapse Prevention Therapy   Ledell Peoples Smart, LCSW 07/12/2017 12:34 PM

## 2017-07-12 NOTE — Progress Notes (Signed)
Adult Psychoeducational Group Note  Date:  07/12/2017 Time:  7:15 PM  Group Topic/Focus:  Recovery Goals:   The focus of this group is to identify appropriate goals for recovery and establish a plan to achieve them.  Participation Level:  Active  Participation Quality:  Appropriate  Affect:  Appropriate  Cognitive:  Appropriate  Insight: Appropriate  Engagement in Group:  Engaged  Modes of Intervention:  Discussion  Additional Comments:  Patient participated in group he shared his goals and recovery options about moving forward.   Wyn Forster Mamoudou [place]vehill 07/12/2017, 7:15 PM

## 2017-07-12 NOTE — Progress Notes (Signed)
[facility] MD Progress Note  07/12/2017 4:52 PM Chris Olson  MRN:  [id number] Subjective:  Reports some improvement , today reports anxiety and is focused on disposition planning, states he is between trying to go to a rehab or going to an [employer] setting at discharge. Denies medication side effects. Objective: I have discussed case with treatment team and have met with patient. 27 years old male, on disability, had been staying at a friend's prior to admission. Presented due to depression and suicidal ideations of ingesting poison. Reported heavy, daily drinking prior to admission. Today presents with partially improved mood and range of affect. Remains depressed, affect is less irritable, less dysphoric, becoming more reactive. At this time denies suicidal ideations, and presents future oriented, focusing on possible disposition plan options, as above. Denies medication side effects.  At this time he is not presenting with symptoms of WDL- no tremors, no diaphoresis, no restlessness or agitation, vitals stable . Principal Problem: MDD (major depressive disorder), recurrent episode, severe ([place]) Diagnosis:   Patient Active Problem List   Diagnosis Date Noted  . MDD (major depressive disorder), recurrent severe, without psychosis ([place]) [F33.2] 07/10/2017  . MDD (major depressive disorder), recurrent episode, severe ([place]) [F33.2] 04/22/2017  . Adjustment disorder with mixed disturbance of emotions and conduct [F43.25] 04/18/2017  . Accidental drug overdose [T50.901A]    Total Time spent with patient: 20 minutes  Past Psychiatric History:  Past Medical History:  Past Medical History:  Diagnosis Date  . Chest pain   . Pericarditis    History reviewed. No pertinent surgical history. Family History: History reviewed. No pertinent family history. Family Psychiatric  History: Social History:  Social History   Substance and Sexual Activity  Alcohol Use Yes   Comment: social use -- BAC  was clear     Social History   Substance and Sexual Activity  Drug Use Yes  . Types: Marijuana   Comment: UDS was clear    Social History   Socioeconomic History  . Marital status: Married    Spouse name: Not on file  . Number of children: Not on file  . Years of education: Not on file  . Highest education level: Not on file  Occupational History  . Not on file  Social Needs  . Financial resource strain: Not on file  . Food insecurity:    Worry: Not on file    Inability: Not on file  . Transportation needs:    Medical: Not on file    Non-medical: Not on file  Tobacco Use  . Smoking status: Current Every Day Smoker    Types: Cigarettes  . Smokeless tobacco: Never Used  Substance and Sexual Activity  . Alcohol use: Yes    Comment: social use -- BAC was clear  . Drug use: Yes    Types: Marijuana    Comment: UDS was clear  . Sexual activity: Not Currently  Lifestyle  . Physical activity:    Days per week: Not on file    Minutes per session: Not on file  . Stress: Not on file  Relationships  . Social connections:    Talks on phone: Not on file    Gets together: Not on file    Attends religious service: Not on file    Active member of club or organization: Not on file    Attends meetings of clubs or organizations: Not on file    Relationship status: Not on file  Other Topics Concern  .  Not on file  Social History Narrative  . Not on file   Additional Social History:   Sleep: improved   Appetite:  Good  Current Medications: Current Facility-Administered Medications  Medication Dose Route Frequency Provider Last Rate Last Dose  . acetaminophen (TYLENOL) tablet 650 mg  650 mg Oral Q6H PRN Ethelene Hal, NP      . alum & mag hydroxide-simeth (MAALOX/MYLANTA) 200-200-20 MG/5ML suspension 30 mL  30 mL Oral Q4H PRN Ethelene Hal, NP      . chlordiazePOXIDE (LIBRIUM) capsule 25 mg  25 mg Oral Q6H PRN Ellamarie Naeve, Myer Peer, MD   25 mg at 07/11/17 2210   . hydrOXYzine (ATARAX/VISTARIL) tablet 25 mg  25 mg Oral Q6H PRN Azalee Weimer, Myer Peer, MD      . loperamide (IMODIUM) capsule 2-4 mg  2-4 mg Oral PRN Zachrey Deutscher, Myer Peer, MD      . magnesium hydroxide (MILK OF MAGNESIA) suspension 30 mL  30 mL Oral Daily PRN Ethelene Hal, NP      . nicotine polacrilex (NICORETTE) gum 2 mg  2 mg Oral PRN Suri Tafolla, Myer Peer, MD   2 mg at 07/11/17 2210  . ondansetron (ZOFRAN-ODT) disintegrating tablet 4 mg  4 mg Oral Q6H PRN Jayr Lupercio A, MD      . QUEtiapine (SEROQUEL) tablet 100 mg  100 mg Oral QHS Ethelene Hal, NP   100 mg at 07/11/17 2210  . sertraline (ZOLOFT) tablet 50 mg  50 mg Oral Daily Andilyn Bettcher, Myer Peer, MD   50 mg at 07/12/17 0746  . thiamine (VITAMIN B-1) tablet 100 mg  100 mg Oral Daily Lexton Hidalgo, Myer Peer, MD   100 mg at 07/12/17 0746  . traZODone (DESYREL) tablet 50 mg  50 mg Oral QHS PRN Ethelene Hal, NP        Lab Results: No results found for this or any previous visit (from the past 57 hour(s)).  Blood Alcohol level:  Lab Results  Component Value Date   ETH <10 07/10/2017   ETH 103 (H) 31/51/7616    Metabolic Disorder Labs: Lab Results  Component Value Date   HGBA1C 5.2 04/05/2017   MPG 102.54 04/05/2017   MPG 94 09/11/2016   No results found for: PROLACTIN Lab Results  Component Value Date   CHOL 165 04/05/2017   TRIG 213 (H) 04/05/2017   HDL 44 04/05/2017   CHOLHDL 3.8 04/05/2017   VLDL 43 (H) 04/05/2017   LDLCALC 78 04/05/2017    Physical Findings: AIMS: Facial and Oral Movements Muscles of Facial Expression: None, normal Lips and Perioral Area: None, normal Jaw: None, normal Tongue: None, normal,Extremity Movements Upper (arms, wrists, hands, fingers): None, normal Lower (legs, knees, ankles, toes): None, normal, Trunk Movements Neck, shoulders, hips: None, normal, Overall Severity Severity of abnormal movements (highest score from questions above): None, normal Incapacitation due to  abnormal movements: None, normal Patient's awareness of abnormal movements (rate only patient's report): No Awareness, Dental Status Current problems with teeth and/or dentures?: No Does patient usually wear dentures?: No  CIWA:  CIWA-Ar Total: 0 COWS:     Musculoskeletal: Strength & Muscle Tone: within normal limits- no tremors, no diaphoresis, no restlessness or agitation Gait & Station: normal Patient leans: N/A  Psychiatric Specialty Exam: Physical Exam  ROS denies chest pain, no dyspnea, no vomiting   Blood pressure 131/68, pulse 91, temperature 98.1 F (36.7 C), temperature source Oral, resp. rate 16, height 5' 8"  (1.727 m), weight 62.1  kg (137 lb), SpO2 100 %.Body mass index is 20.83 kg/m.  General Appearance: improving grooming   Eye Contact:  Fair- improves during session  Speech:  Normal Rate  Volume:  Normal  Mood:  depressed, partially improved   Affect:  still constricted, less irritable, does smile at times appropriately   Thought Process:  Linear and Descriptions of Associations: Intact  Orientation:  Other:  fully alert and attentive   Thought Content:  no hallucinations, no delusions, not internally preoccuopied   Suicidal Thoughts:  No today denies suicidal or self injurious ideations , denies any  homicidal or violent ideations today  Homicidal Thoughts:  No  Memory:  recent and remote grossly intact   Judgement:  Fair- improving   Insight:  Fair  Psychomotor Activity:  Normal  Concentration:  Concentration: Good and Attention Span: Good  Recall:  Good  Fund of Knowledge:  Good  Language:  Good  Akathisia:  Negative  Handed:  Right  AIMS (if indicated):     Assets:  Communication Skills Desire for Improvement Resilience  ADL's:  Intact  Cognition:  WNL  Sleep:  Number of Hours: 6.25   Assessment - patient presenting with partially improved mood and range of affect. Today denies SI or HI ( on admission had reported thoughts of hurting his SO) . He does  not present with alcohol WDL symptoms at this time and vitals are stable. Currently presents future oriented, and today focused on disposition planning, debating whether to go to a rehab or try to get into an [facility] setting. Denies medication side effects.  Treatment Plan Summary: Daily contact with patient to assess and evaluate symptoms and progress in treatment, Medication management, Plan inpatient treatment  and medications as below Encourage group and milieu participation to work on coping skills and symptom reduction Encourage efforts to work on recovery , sobriety Treatment team working on disposition planning options- see above Continue Seroquel 100 mgrs QHS for mood disorder  Continue Zoloft 50 mgrs QDAY for depression Continue Trazodone 50 mgrs QHS PRN for insomnia as needed  Continue  Vistaril 25 mgr Q 6 hours PRN for anxiety as needed  Continue Librium PRNs for alcohol WDL if needed    Jenne Campus, MD 07/12/2017, 4:52 PM

## 2017-07-13 DIAGNOSIS — Z79899 Other long term (current) drug therapy: Secondary | ICD-10-CM

## 2017-07-13 NOTE — [facility] Group Notes (Signed)
[facility] Group Notes: (Clinical Social Work)   07/13/2017      Type of Therapy:  Group Therapy   Participation Level:  Did Not Attend despite MHT prompting   Ambrose Mantle, LCSW 07/13/2017, 11:14 AM

## 2017-07-13 NOTE — [facility] Group Notes (Signed)
Pt was invited but did not attend orientation/goals group. 

## 2017-07-13 NOTE — [facility] Group Notes (Signed)
Life SKills   Date:  07/13/2017  Time:  1300  Type of Therapy:  Nurse Education /  The group focuses on teaching patients how to identify their needs and then how to develop the skills needed to get them met .   Participation Level:  Active  Participation Quality:  Attentive  Affect:  Appropriate  Cognitive:  Appropriate  Insight:  Improving  Engagement in Group:  Engaged  Modes of Intervention:  Education  Summary of Progress/Problems:  Chris Olson 07/13/2017, 3:42 PM

## 2017-07-13 NOTE — Progress Notes (Signed)
Patient ID: Chris Olson, male   DOB: 03/10/1990, 27 y.o.   MRN: [id number] DAR Note: Pt observed in the dayroom interacting with peers. Pt at the time of assessment complained of worsening depression; "I am hopeless" Pt also complained of moderate anxiety. Pt denied SI/HI, pain or AVH; "I hope I would be able to go to a long-term program." Medication offered as prescribed. All patient's questions and concerns addressed. Support, encouragement, and safe environment provided. 15-minute safety checks continue. Pt was med compliant. Pt attended AA group.

## 2017-07-13 NOTE — Plan of Care (Signed)
  Problem: Education: Goal: Mental status will improve Outcome: Progressing   

## 2017-07-13 NOTE — Progress Notes (Signed)
Patient did attend the evening speaker AA meeting.  

## 2017-07-13 NOTE — Progress Notes (Signed)
D Pt has been observed OOB UAL on the 300 hall today. HE endorses a sad,depressed, quiet affect.     A HE attended his LIfe [facility] group today and was engaged in the [place] and asked many appropriate questions- related to learning how to identify unhealthy behaviors. He completed his daily assessment and on this he wrote  He deneied SI today and he rated his depression, hopelsssness and anxiety " 1/1/1/", respectively.     R He is continuing to proces, identify his unhelathy traits and trying to unsstand his feelings.

## 2017-07-13 NOTE — Progress Notes (Signed)
[facility] MD Progress Note  07/13/2017 11:48 AM Chris Olson  MRN:  [id number]   Subjective: Patient reports that he is feeling good today.  He denies any SI/HI/AVH and contracts for safety.  Patient denies any medication side effects and denies any withdrawal symptoms today.  Patient reports he is hoping to be able to go to a residential program for 30-90 days potentially hoping to get into one that is longer.  He is not opposed to going to any residential program at this time.  Objective: Patient's chart and findings reviewed and discussed with treatment team.  Patient presents in his room lying in his bed and he refuses to uncover his head to speak with me patient is not manic but very minimal contact, he looked out from underneath the blankets to see who I was.  Patient is pleasant and cooperative though.  We will continue current medications as prescribed.  Will notify CSW of patient requesting information for residential program.  Principal Problem: MDD (major depressive disorder), recurrent episode, severe ([facility]) Diagnosis:   Patient Active Problem List   Diagnosis Date Noted  . MDD (major depressive disorder), recurrent severe, without psychosis ([facility]) [F33.2] 07/10/2017  . MDD (major depressive disorder), recurrent episode, severe ([facility]) [F33.2] 04/22/2017  . Adjustment disorder with mixed disturbance of emotions and conduct [F43.25] 04/18/2017  . Accidental drug overdose [T50.901A]    Total Time spent with patient: 20 minutes  Past Psychiatric History: See H&P  Past Medical History:  Past Medical History:  Diagnosis Date  . Chest pain   . Pericarditis    History reviewed. No pertinent surgical history. Family History: History reviewed. No pertinent family history. Family Psychiatric  History: See H&P Social History:  Social History   Substance and Sexual Activity  Alcohol Use Yes   Comment: social use -- BAC was clear     Social History   Substance and Sexual Activity  Drug  Use Yes  . Types: Marijuana   Comment: UDS was clear    Social History   Socioeconomic History  . Marital status: Married    Spouse name: Not on file  . Number of children: Not on file  . Years of education: Not on file  . Highest education level: Not on file  Occupational History  . Not on file  Social Needs  . Financial resource strain: Not on file  . Food insecurity:    Worry: Not on file    Inability: Not on file  . Transportation needs:    Medical: Not on file    Non-medical: Not on file  Tobacco Use  . Smoking status: Current Every Day Smoker    Types: Cigarettes  . Smokeless tobacco: Never Used  Substance and Sexual Activity  . Alcohol use: Yes    Comment: social use -- BAC was clear  . Drug use: Yes    Types: Marijuana    Comment: UDS was clear  . Sexual activity: Not Currently  Lifestyle  . Physical activity:    Days per week: Not on file    Minutes per session: Not on file  . Stress: Not on file  Relationships  . Social connections:    Talks on phone: Not on file    Gets together: Not on file    Attends religious service: Not on file    Active member of club or organization: Not on file    Attends meetings of clubs or organizations: Not on file    Relationship status:  Not on file  Other Topics Concern  . Not on file  Social History Narrative  . Not on file   Additional Social History:                         Sleep: Good  Appetite:  Good  Current Medications: Current Facility-Administered Medications  Medication Dose Route Frequency Provider Last Rate Last Dose  . acetaminophen (TYLENOL) tablet 650 mg  650 mg Oral Q6H PRN Laveda Abbe, NP      . alum & mag hydroxide-simeth (MAALOX/MYLANTA) 200-200-20 MG/5ML suspension 30 mL  30 mL Oral Q4H PRN Laveda Abbe, NP      . chlordiazePOXIDE (LIBRIUM) capsule 25 mg  25 mg Oral Q6H PRN Cobos, Rockey Situ, MD   25 mg at 07/11/17 2210  . hydrOXYzine (ATARAX/VISTARIL) tablet 25  mg  25 mg Oral Q6H PRN Cobos, Rockey Situ, MD   25 mg at 07/12/17 2145  . loperamide (IMODIUM) capsule 2-4 mg  2-4 mg Oral PRN Cobos, Rockey Situ, MD      . magnesium hydroxide (MILK OF MAGNESIA) suspension 30 mL  30 mL Oral Daily PRN Laveda Abbe, NP      . nicotine polacrilex (NICORETTE) gum 2 mg  2 mg Oral PRN Cobos, Rockey Situ, MD   2 mg at 07/12/17 2147  . ondansetron (ZOFRAN-ODT) disintegrating tablet 4 mg  4 mg Oral Q6H PRN Cobos, Rockey Situ, MD      . QUEtiapine (SEROQUEL) tablet 100 mg  100 mg Oral QHS Laveda Abbe, NP   100 mg at 07/12/17 2145  . sertraline (ZOLOFT) tablet 50 mg  50 mg Oral Daily Cobos, Rockey Situ, MD   50 mg at 07/12/17 0746  . thiamine (VITAMIN B-1) tablet 100 mg  100 mg Oral Daily Cobos, Rockey Situ, MD   100 mg at 07/12/17 0746  . traZODone (DESYREL) tablet 50 mg  50 mg Oral QHS PRN Laveda Abbe, NP        Lab Results: No results found for this or any previous visit (from the past 48 hour(s)).  Blood Alcohol level:  Lab Results  Component Value Date   ETH <10 07/10/2017   ETH 103 (H) 04/21/2017    Metabolic Disorder Labs: Lab Results  Component Value Date   HGBA1C 5.2 04/05/2017   MPG 102.54 04/05/2017   MPG 94 09/11/2016   No results found for: PROLACTIN Lab Results  Component Value Date   CHOL 165 04/05/2017   TRIG 213 (H) 04/05/2017   HDL 44 04/05/2017   CHOLHDL 3.8 04/05/2017   VLDL 43 (H) 04/05/2017   LDLCALC 78 04/05/2017    Physical Findings: AIMS: Facial and Oral Movements Muscles of Facial Expression: None, normal Lips and Perioral Area: None, normal Jaw: None, normal Tongue: None, normal,Extremity Movements Upper (arms, wrists, hands, fingers): None, normal Lower (legs, knees, ankles, toes): None, normal, Trunk Movements Neck, shoulders, hips: None, normal, Overall Severity Severity of abnormal movements (highest score from questions above): None, normal Incapacitation due to abnormal movements: None,  normal Patient's awareness of abnormal movements (rate only patient's report): No Awareness, Dental Status Current problems with teeth and/or dentures?: No Does patient usually wear dentures?: No  CIWA:  CIWA-Ar Total: 3 COWS:     Musculoskeletal: Strength & Muscle Tone: within normal limits Gait & Station: normal Patient leans: N/A  Psychiatric Specialty Exam: Physical Exam  Nursing note and vitals reviewed. Constitutional: He is  oriented to person, place, and time. He appears well-developed and well-nourished.  Cardiovascular: Normal rate.  Respiratory: Effort normal.  Musculoskeletal: Normal range of motion.  Neurological: He is alert and oriented to person, place, and time.  Skin: Skin is warm.    Review of Systems  Constitutional: Negative.   HENT: Negative.   Eyes: Negative.   Respiratory: Negative.   Cardiovascular: Negative.   Gastrointestinal: Negative.   Genitourinary: Negative.   Musculoskeletal: Negative.   Skin: Negative.   Neurological: Negative.   Endo/Heme/Allergies: Negative.   Psychiatric/Behavioral: Positive for depression and substance abuse. Negative for hallucinations and suicidal ideas. The patient is not nervous/anxious and does not have insomnia.     Blood pressure 118/70, pulse (!) 53, temperature 98 F (36.7 C), temperature source Oral, resp. rate 16, height  (1.727 m), weight 62.1 kg (137 lb), SpO2 100 %.Body mass index is 20.83 kg/m.  General Appearance: Disheveled  Eye Contact:  Minimal  Speech:  Clear and Coherent and Normal Rate  Volume:  Decreased  Mood:  Depressed  Affect:  Flat  Thought Process:  Goal Directed and Descriptions of Associations: Intact  Orientation:  Full (Time, Place, and Person)  Thought Content:  WDL  Suicidal Thoughts:  No  Homicidal Thoughts:  No  Memory:  Immediate;   Good Recent;   Good Remote;   Good  Judgement:  Fair  Insight:  Fair  Psychomotor Activity:  Normal  Concentration:  Concentration:  Good and Attention Span: Good  Recall:  Good  Fund of Knowledge:  Good  Language:  Good  Akathisia:  No  Handed:  Right  AIMS (if indicated):     Assets:  Communication Skills Desire for Improvement Financial Resources/Insurance Physical Health Resilience  ADL's:  Intact  Cognition:  WNL  Sleep:  Number of Hours: 6   Problems addressed MDD severe recurrent  Treatment Plan Summary: Daily contact with patient to assess and evaluate symptoms and progress in treatment, Medication management and Plan is to: -Discontinue Librium detox protocol due to no medication prescribed and more than 48 hours -Continue Vistaril 25 mg every 6 as needed for anxiety -Continue Seroquel 100 mg p.o. nightly for mood stability -Continue Zoloft 50 mg p.o. daily for mood stability -Continue trazodone 50 mg p.o. nightly as needed for insomnia -Encourage group therapy participation -CSW to assist with arrangements for residential treatment program  Maryfrances Bunnell, FNP 07/13/2017, 11:48 AM

## 2017-07-13 NOTE — Progress Notes (Signed)
D.  Pt pleasant on approach, denies complaints at this time.  Pt was positive for evening AA group, observed engaged in appropriate interaction with peers on the unit.  Pt denies SI/HI/AVH at this time.  A.  Support and encouragement offered, medications given as ordered  R.  Pt remains safe on the unit, will continue to monitor.   

## 2017-07-14 MED ORDER — HYDROXYZINE HCL 25 MG PO TABS
25.0000 mg | ORAL_TABLET | Freq: Four times a day (QID) | ORAL | Status: DC | PRN
  Administered 2017-07-14 – 2017-07-15 (×2): 25 mg via ORAL
  Filled 2017-07-14 (×2): qty 1

## 2017-07-14 NOTE — [facility] Group Notes (Signed)
[facility] Group Notes: (Clinical Social Work)   07/14/2017      Type of Therapy:  Group Therapy   Participation Level:  Did Not Attend despite MHT prompting   Ambrose Mantle, LCSW 07/14/2017, 12:48 PM

## 2017-07-14 NOTE — Plan of Care (Signed)
  Problem: Education: Goal: Knowledge of [place] [facility] Education information/materials will improve Outcome: Progressing   

## 2017-07-14 NOTE — [facility] Group Notes (Signed)
[facility] Group Notes:  (Nursing/MHT/Case Management/Adjunct)  Date:  07/14/2017  Time:  3:31 PM Type of Therapy:  Psychoeducational Skills  Participation Level:  Active  Participation Quality:  Appropriate  Affect:  Appropriate  Cognitive:  Appropriate  Insight:  Appropriate  Engagement in Group:  Engaged  Modes of Intervention:  Problem-solving  Summary of Progress/Problems: The group discussed about anger manag   Bethann Punches 07/14/2017, 3:31 PM

## 2017-07-14 NOTE — Progress Notes (Signed)
Patient did attend the evening speaker AA meeting.  

## 2017-07-14 NOTE — Progress Notes (Signed)
[facility] MD Progress Note  07/14/2017 1:34 PM Chris Olson  MRN:  [id number]   Subjective: Patient states that he is feeling good today and has no complaints.  He states that he slept well last night and has had a good appetite.  He reports going to groups and interacting with other peers.  He states that he denies any SI/HI/AVH and contracts for safety.  He is just wanting to go to residential rehabilitation and is waiting for some assistance from CSW for that.  He asks about going to [facility]?  In [place].  Objective: Patient's chart and findings reviewed and discussed with treatment team.  Patient patient is seen to be a little more active today than he was yesterday as he has been in groups and interacting with peers and staff in the hallway and in the day room.  Patient has been appropriate as well as being pleasant and cooperative.  Encouraged patient to communicate with CSW about his future plans with rehabilitation treatment.  We will continue current medications as prescribed.  Principal Problem: MDD (major depressive disorder), recurrent episode, severe (HCC) Diagnosis:   Patient Active Problem List   Diagnosis Date Noted  . MDD (major depressive disorder), recurrent severe, without psychosis (HCC) [F33.2] 07/10/2017  . MDD (major depressive disorder), recurrent episode, severe (HCC) [F33.2] 04/22/2017  . Adjustment disorder with mixed disturbance of emotions and conduct [F43.25] 04/18/2017  . Accidental drug overdose [T50.901A]    Total Time spent with patient: 20 minutes  Past Psychiatric History: See H&P  Past Medical History:  Past Medical History:  Diagnosis Date  . Chest pain   . Pericarditis    History reviewed. No pertinent surgical history. Family History: History reviewed. No pertinent family history. Family Psychiatric  History: See H&P Social History:  Social History   Substance and Sexual Activity  Alcohol Use Yes   Comment: social use -- BAC was clear      Social History   Substance and Sexual Activity  Drug Use Yes  . Types: Marijuana   Comment: UDS was clear    Social History   Socioeconomic History  . Marital status: Married    Spouse name: Not on file  . Number of children: Not on file  . Years of education: Not on file  . Highest education level: Not on file  Occupational History  . Not on file  Social Needs  . Financial resource strain: Not on file  . Food insecurity:    Worry: Not on file    Inability: Not on file  . Transportation needs:    Medical: Not on file    Non-medical: Not on file  Tobacco Use  . Smoking status: Current Every Day Smoker    Types: Cigarettes  . Smokeless tobacco: Never Used  Substance and Sexual Activity  . Alcohol use: Yes    Comment: social use -- BAC was clear  . Drug use: Yes    Types: Marijuana    Comment: UDS was clear  . Sexual activity: Not Currently  Lifestyle  . Physical activity:    Days per week: Not on file    Minutes per session: Not on file  . Stress: Not on file  Relationships  . Social connections:    Talks on phone: Not on file    Gets together: Not on file    Attends religious service: Not on file    Active member of club or organization: Not on file  Attends meetings of clubs or organizations: Not on file    Relationship status: Not on file  Other Topics Concern  . Not on file  Social History Narrative  . Not on file   Additional Social History:                         Sleep: Good  Appetite:  Good  Current Medications: Current Facility-Administered Medications  Medication Dose Route Frequency Provider Last Rate Last Dose  . acetaminophen (TYLENOL) tablet 650 mg  650 mg Oral Q6H PRN Laveda Abbe, NP      . alum & mag hydroxide-simeth (MAALOX/MYLANTA) 200-200-20 MG/5ML suspension 30 mL  30 mL Oral Q4H PRN Laveda Abbe, NP      . magnesium hydroxide (MILK OF MAGNESIA) suspension 30 mL  30 mL Oral Daily PRN Laveda Abbe, NP      . nicotine polacrilex (NICORETTE) gum 2 mg  2 mg Oral PRN Cobos, Rockey Situ, MD   2 mg at 07/14/17 1302  . QUEtiapine (SEROQUEL) tablet 100 mg  100 mg Oral QHS Laveda Abbe, NP   100 mg at 07/13/17 2151  . sertraline (ZOLOFT) tablet 50 mg  50 mg Oral Daily Cobos, Rockey Situ, MD   50 mg at 07/14/17 1209  . thiamine (VITAMIN B-1) tablet 100 mg  100 mg Oral Daily Cobos, Rockey Situ, MD   100 mg at 07/14/17 1210  . traZODone (DESYREL) tablet 50 mg  50 mg Oral QHS PRN Laveda Abbe, NP        Lab Results: No results found for this or any previous visit (from the past 48 hour(s)).  Blood Alcohol level:  Lab Results  Component Value Date   ETH <10 07/10/2017   ETH 103 (H) 04/21/2017    Metabolic Disorder Labs: Lab Results  Component Value Date   HGBA1C 5.2 04/05/2017   MPG 102.54 04/05/2017   MPG 94 09/11/2016   No results found for: PROLACTIN Lab Results  Component Value Date   CHOL 165 04/05/2017   TRIG 213 (H) 04/05/2017   HDL 44 04/05/2017   CHOLHDL 3.8 04/05/2017   VLDL 43 (H) 04/05/2017   LDLCALC 78 04/05/2017    Physical Findings: AIMS: Facial and Oral Movements Muscles of Facial Expression: None, normal Lips and Perioral Area: None, normal Jaw: None, normal Tongue: None, normal,Extremity Movements Upper (arms, wrists, hands, fingers): None, normal Lower (legs, knees, ankles, toes): None, normal, Trunk Movements Neck, shoulders, hips: None, normal, Overall Severity Severity of abnormal movements (highest score from questions above): None, normal Incapacitation due to abnormal movements: None, normal Patient's awareness of abnormal movements (rate only patient's report): No Awareness, Dental Status Current problems with teeth and/or dentures?: No Does patient usually wear dentures?: No  CIWA:  CIWA-Ar Total: 0 COWS:     Musculoskeletal: Strength & Muscle Tone: within normal limits Gait & Station: normal Patient leans:  N/A  Psychiatric Specialty Exam: Physical Exam  Nursing note and vitals reviewed. Constitutional: He is oriented to person, place, and time. He appears well-developed and well-nourished.  Cardiovascular: Normal rate.  Respiratory: Effort normal.  Musculoskeletal: Normal range of motion.  Neurological: He is alert and oriented to person, place, and time.  Skin: Skin is warm.    Review of Systems  Constitutional: Negative.   HENT: Negative.   Eyes: Negative.   Respiratory: Negative.   Cardiovascular: Negative.   Gastrointestinal: Negative.   Genitourinary: Negative.  Musculoskeletal: Negative.   Skin: Negative.   Neurological: Negative.   Endo/Heme/Allergies: Negative.   Psychiatric/Behavioral: Positive for depression and substance abuse. Negative for hallucinations and suicidal ideas. The patient is not nervous/anxious and does not have insomnia.     Blood pressure 114/78, pulse 86, temperature 98.5 F (36.9 C), temperature source Oral, resp. rate 16, height  (1.727 m), weight 62.1 kg (137 lb), SpO2 100 %.Body mass index is 20.83 kg/m.  General Appearance: Casual and Fairly Groomed  Eye Contact:  Good  Speech:  Clear and Coherent and Normal Rate  Volume:  Normal  Mood:  Euthymic  Affect:  Congruent  Thought Process:  Goal Directed and Descriptions of Associations: Intact  Orientation:  Full (Time, Place, and Person)  Thought Content:  WDL  Suicidal Thoughts:  No  Homicidal Thoughts:  No  Memory:  Immediate;   Good Recent;   Good Remote;   Good  Judgement:  Fair  Insight:  Fair  Psychomotor Activity:  Normal  Concentration:  Concentration: Good and Attention Span: Good  Recall:  Good  Fund of Knowledge:  Good  Language:  Good  Akathisia:  No  Handed:  Right  AIMS (if indicated):     Assets:  Communication Skills Desire for Improvement Financial Resources/Insurance Physical Health Resilience  ADL's:  Intact  Cognition:  WNL  Sleep:  Number of Hours: 4.75    Problems addressed MDD severe recurrent  Treatment Plan Summary: Daily contact with patient to assess and evaluate symptoms and progress in treatment, Medication management and Plan is to: -Continue Vistaril 25 mg every 6 as needed for anxiety -Continue Seroquel 100 mg p.o. nightly for mood stability -Continue Zoloft 50 mg p.o. daily for mood stability -Continue trazodone 50 mg p.o. nightly as needed for insomnia -Encourage group therapy participation -CSW to assist with arrangements for residential treatment program  Maryfrances Bunnell, FNP 07/14/2017, 1:34 PM

## 2017-07-14 NOTE — Progress Notes (Signed)
D.  Pt pleasant but anxious on approach, was disappointed that his brother was unable to visit due to situation on unit at the time.  Pt was able to handle this disappointment and was able to attend evening AA group with appropriate participation.  Pt observed playing cards and interacting appropriately with peers on the unit in dayroom.  Pt denies SI/HI/AVH at this time.  A.  Support and encouragement offered, medication given as ordered  R.  Pt remains safe on the unit, will continue to monitor.

## 2017-07-14 NOTE — Progress Notes (Signed)
D Pt is observed sitting in the dayroom this am, after he returns to the 300 hall from eating his breakfast in the cafe'. His affect is flat, blunted and sad. He avoids making eye contact with this Clinical research associate. He moves about slowly, he wears the same clothes that he wore 07/13/17. He endorses a negative attitude and he is focused on what he has no control over.    A He completed his daily assessment and on this he wrote he denied SI today and he rated his depression, hopelessness and anxiety " 1/1/1/", respectively.    R Safety is in place.

## 2017-07-15 DIAGNOSIS — F1210 Cannabis abuse, uncomplicated: Secondary | ICD-10-CM

## 2017-07-15 MED ORDER — QUETIAPINE FUMARATE 100 MG PO TABS: 100.0000 mg | ORAL_TABLET | Freq: Every day | ORAL | 0 refills | Status: DC

## 2017-07-15 MED ORDER — SERTRALINE HCL 50 MG PO TABS: 50.0000 mg | ORAL_TABLET | Freq: Every day | ORAL | 0 refills | Status: DC

## 2017-07-15 MED ORDER — NICOTINE POLACRILEX 2 MG MT GUM: 2.0000 mg | CHEWING_GUM | OROMUCOSAL | 0 refills | Status: DC | PRN

## 2017-07-15 MED ORDER — TRAZODONE HCL 50 MG PO TABS: 50.0000 mg | ORAL_TABLET | Freq: Every evening | ORAL | 0 refills | Status: DC | PRN

## 2017-07-15 MED ORDER — HYDROXYZINE HCL 25 MG PO TABS: 25.0000 mg | ORAL_TABLET | Freq: Four times a day (QID) | ORAL | 0 refills | Status: DC | PRN

## 2017-07-15 NOTE — Progress Notes (Signed)
Patient ID: Chris Olson, male   DOB: 1991/01/18, 27 y.o.   MRN: [id number]  D: Patient calm and cooperative, denies SI/HI/AVH, reports a good sleep quality last night, reports a good appetite, describes a normal energy level, and reports a good concentration level.  Pt rates his depression today as 3 (10 being the worst), rates his hopelessness level as 5 (10 being the worst), and rates his anxiety level as 1 (10 being the worst).  Pt denies being in any physical pain, and is visible in the milieu interacting in activities with peers.  A: Pt educated on all of his medications and verbalized understanding.  Q15 minute checks being maintained for safety.  R: Will maintain on Q15 minute safety checks and will continue to monitor.

## 2017-07-15 NOTE — Progress Notes (Addendum)
[facility] MD Progress Note  07/15/2017 12:44 PM Chris Olson  MRN:  [id number]   Subjective: Patient states "I'm doing OK. I feel like I"m improving a lot in terms of my depression. I'm hoping I can get into [facility] so I'm waiting to hear about that."   Objective: Pt seen and chart reviewed. Pt is alert/oriented x4, calm, cooperative, and appropriate to situation. Pt denies suicidal/homicidal ideation and psychosis and does not appear to be responding to internal stimuli. Pt reports that he is motivated to change and feels that [facility] would be a good option for him in terms of getting better long-term. He states he has been there before and it was very effective.    Principal Problem: MDD (major depressive disorder), recurrent severe, without psychosis (HCC) Diagnosis:   Patient Active Problem List   Diagnosis Date Noted  . MDD (major depressive disorder), recurrent severe, without psychosis (HCC) [F33.2] 07/10/2017    Priority: High  . Adjustment disorder with mixed disturbance of emotions and conduct [F43.25] 04/18/2017  . Accidental drug overdose [T50.901A]    Total Time spent with patient: 30 minutes  Past Psychiatric History: See H&P  Past Medical History:  Past Medical History:  Diagnosis Date  . Chest pain   . Pericarditis    History reviewed. No pertinent surgical history. Family History: History reviewed. No pertinent family history. Family Psychiatric  History: See H&P Social History:  Social History   Substance and Sexual Activity  Alcohol Use Yes   Comment: social use -- BAC was clear     Social History   Substance and Sexual Activity  Drug Use Yes  . Types: Marijuana   Comment: UDS was clear    Social History   Socioeconomic History  . Marital status: Married    Spouse name: Not on file  . Number of children: Not on file  . Years of education: Not on file  . Highest education level: Not on file  Occupational History  . Not on file  Social Needs  .  Financial resource strain: Not on file  . Food insecurity:    Worry: Not on file    Inability: Not on file  . Transportation needs:    Medical: Not on file    Non-medical: Not on file  Tobacco Use  . Smoking status: Current Every Day Smoker    Types: Cigarettes  . Smokeless tobacco: Never Used  Substance and Sexual Activity  . Alcohol use: Yes    Comment: social use -- BAC was clear  . Drug use: Yes    Types: Marijuana    Comment: UDS was clear  . Sexual activity: Not Currently  Lifestyle  . Physical activity:    Days per week: Not on file    Minutes per session: Not on file  . Stress: Not on file  Relationships  . Social connections:    Talks on phone: Not on file    Gets together: Not on file    Attends religious service: Not on file    Active member of club or organization: Not on file    Attends meetings of clubs or organizations: Not on file    Relationship status: Not on file  Other Topics Concern  . Not on file  Social History Narrative  . Not on file   Additional Social History:                         Sleep: Good  Appetite:  Good  Current Medications: Current Facility-Administered Medications  Medication Dose Route Frequency Provider Last Rate Last Dose  . acetaminophen (TYLENOL) tablet 650 mg  650 mg Oral Q6H PRN Laveda Abbe, NP      . alum & mag hydroxide-simeth (MAALOX/MYLANTA) 200-200-20 MG/5ML suspension 30 mL  30 mL Oral Q4H PRN Laveda Abbe, NP      . hydrOXYzine (ATARAX/VISTARIL) tablet 25 mg  25 mg Oral Q6H PRN Nira Conn A, NP   25 mg at 07/14/17 2233  . magnesium hydroxide (MILK OF MAGNESIA) suspension 30 mL  30 mL Oral Daily PRN Laveda Abbe, NP      . nicotine polacrilex (NICORETTE) gum 2 mg  2 mg Oral PRN Cobos, Rockey Situ, MD   2 mg at 07/14/17 2233  . QUEtiapine (SEROQUEL) tablet 100 mg  100 mg Oral QHS Laveda Abbe, NP   100 mg at 07/14/17 2233  . sertraline (ZOLOFT) tablet 50 mg  50 mg Oral  Daily Cobos, Rockey Situ, MD   50 mg at 07/15/17 0742  . thiamine (VITAMIN B-1) tablet 100 mg  100 mg Oral Daily Cobos, Rockey Situ, MD   100 mg at 07/15/17 0742  . traZODone (DESYREL) tablet 50 mg  50 mg Oral QHS PRN Laveda Abbe, NP        Lab Results: No results found for this or any previous visit (from the past 48 hour(s)).  Blood Alcohol level:  Lab Results  Component Value Date   ETH <10 07/10/2017   ETH 103 (H) 04/21/2017    Metabolic Disorder Labs: Lab Results  Component Value Date   HGBA1C 5.2 04/05/2017   MPG 102.54 04/05/2017   MPG 94 09/11/2016   No results found for: PROLACTIN Lab Results  Component Value Date   CHOL 165 04/05/2017   TRIG 213 (H) 04/05/2017   HDL 44 04/05/2017   CHOLHDL 3.8 04/05/2017   VLDL 43 (H) 04/05/2017   LDLCALC 78 04/05/2017    Physical Findings: AIMS: Facial and Oral Movements Muscles of Facial Expression: None, normal Lips and Perioral Area: None, normal Jaw: None, normal Tongue: None, normal,Extremity Movements Upper (arms, wrists, hands, fingers): None, normal Lower (legs, knees, ankles, toes): None, normal, Trunk Movements Neck, shoulders, hips: None, normal, Overall Severity Severity of abnormal movements (highest score from questions above): None, normal Incapacitation due to abnormal movements: None, normal Patient's awareness of abnormal movements (rate only patient's report): No Awareness, Dental Status Current problems with teeth and/or dentures?: No Does patient usually wear dentures?: No  CIWA:  CIWA-Ar Total: 1 COWS:     Musculoskeletal: Strength & Muscle Tone: within normal limits Gait & Station: normal Patient leans: N/A  Psychiatric Specialty Exam: Physical Exam  Nursing note and vitals reviewed.   Review of Systems  Constitutional: Negative.   HENT: Negative.   Eyes: Negative.   Respiratory: Negative.   Cardiovascular: Negative.   Gastrointestinal: Negative.   Genitourinary: Negative.    Musculoskeletal: Negative.   Skin: Negative.   Neurological: Negative.   Endo/Heme/Allergies: Negative.   Psychiatric/Behavioral: Positive for depression and substance abuse. Negative for hallucinations and suicidal ideas. The patient is not nervous/anxious and does not have insomnia.   All other systems reviewed and are negative.   Blood pressure 114/78, pulse 86, temperature 98.5 F (36.9 C), temperature source Oral, resp. rate 16, height  (1.727 m), weight 62.1 kg (137 lb), SpO2 100 %.Body mass index is 20.83 kg/m.  General Appearance: casual,  fairly groomed  Eye Contact:  fair  Speech:  Clear, normal rate  Volume:  Normal  Mood:  euthymic  Affect:  Congruent  Thought Process:  Goal Directed and Descriptions of Associations: Intact  Orientation:  Full (Time, Place, and Person)  Thought Content:  Focused on treatment options  Suicidal Thoughts:  No denies  Homicidal Thoughts:  No denies  Memory:  Immediate;   Good Recent;   Good Remote;   Good  Judgement:  Fair  Insight:  Fair  Psychomotor Activity:  Normal  Concentration:  Concentration: Good and Attention Span: Good  Recall:  Good  Fund of Knowledge:  Good  Language:  Good  Akathisia:  No  Handed:  Right  AIMS (if indicated):     Assets:  Communication Skills Desire for Improvement Financial Resources/Insurance Physical Health Resilience  ADL's:  Intact  Cognition:  WNL  Sleep:  Number of Hours: 5   Problems addressed MDD (major depressive disorder), recurrent severe, without psychosis (HCC) unstable, managed as below:   Treatment Plan Summary: Daily contact with patient to assess and evaluate symptoms and progress in treatment, Medication management and Plan is to: -Continue Vistaril 25 mg every 6 as needed for anxiety -Continue Seroquel 100 mg p.o. nightly for mood stability -Continue Zoloft 50 mg p.o. daily for mood stability -Continue trazodone 50 mg p.o. nightly as needed for insomnia -Encourage  group therapy participation -CSW to assist with arrangements for residential treatment program  Beau Fanny, FNP 07/15/2017, 12:44 PM ..Agree with NP Progress Note

## 2017-07-15 NOTE — BHH Group Notes (Signed)
Adult Psychoeducational Group Note  Date:  07/15/2017 Time:  9:04 PM  Group Topic/Focus:  Wrap-Up Group:   The focus of this group is to help patients review their daily goal of treatment and discuss progress on daily workbooks.  Participation Level:  Active  Participation Quality:  Appropriate and Attentive  Affect:  Appropriate  Cognitive:  Alert and Appropriate  Insight: Appropriate and Good  Engagement in Group:  Engaged  Modes of Intervention:  Discussion and Education  Additional Comments:  Pt attended and participated in wrap up group this evening. Pt had an "okay" day but they are thinking about their kids and being afraid of failure. Pt is "feeling like a failure" and feeling alone due to their family not being here. Pt did not have any goals for the day, but they are thinking about a facility they may go to once they are discharged.   Chrisandra Netters 07/15/2017, 9:04 PM

## 2017-07-15 NOTE — Progress Notes (Signed)
D: Pt was in front of nurse's station upon initial approach.  He presents with appropriate affect and anxious mood.  He reports his day has "been all right" and his goal is to "get my life together."  Pt discussed how he is "supposed to go to a program tomorrow called [facility]."  Pt reports he feels safe with this plan.  He denies SI/HI, hallucinations, and pain.  Pt has been visible in milieu and he attended evening group.  A: Introduced self to pt.  Actively listened to pt and provided support and encouragement.  Medication administered per order.  PRN medication administered for anxiety per request.  Q15 minute safety checks maintained.  R: Pt is compliant with medications.  He verbally contracts for safety and reports he will inform staff of needs and concerns.  Will continue to monitor and assess.

## 2017-07-16 NOTE — Progress Notes (Signed)
  [facility] Adult Case Management Discharge Plan :  Will you be returning to the same living situation after discharge:  No.Pt accepted to [facility] program for today.  At discharge, do you have transportation home?: Yes,  [facility] program or bus Do you have the ability to pay for your medications: Yes,  [facility]  Release of information consent forms completed and submitted to medical records by CSW.  Patient to Follow up at: Follow-up Information    [facility] Follow up on 07/19/2017.   Specialty:  Behavioral Health Why:  Hospital follow-up on Friday, 5/24 at 8:00AM. Please bring: photo ID and Medicaid card to this appt. Thank you.  Contact information: [address] [place] [address] [phone]        [facility] Follow up on 07/15/2017.   Why:  You have been accepted into this program. Please call Valora Corporal [phone]) to arrange transport from the hospital. Thank you.  Contact information: [address] [place] [place] [address] [phone]           Next level of care provider has access to [facility] Link:no  Safety Planning and Suicide Prevention discussed: Yes,  SPE completed with pt; pt declined to consent to collateral contact. SPI pamphlet and Mobile Crisis information provided to pt.   Have you used any form of tobacco in the last 30 days? (Cigarettes, Smokeless Tobacco, Cigars, and/or Pipes): Yes  Has patient been referred to the Quitline?: Patient refused referral  Patient has been referred for addiction treatment: Yes  [employer], LCSW 07/16/2017, 8:59 AM

## 2017-07-16 NOTE — BHH Suicide Risk Assessment (Signed)
[facility] Discharge Suicide Risk Assessment   Principal Problem: MDD (major depressive disorder), recurrent severe, without psychosis (HCC) Discharge Diagnoses:  Patient Active Problem List   Diagnosis Date Noted  . MDD (major depressive disorder), recurrent severe, without psychosis (HCC) [F33.2] 07/10/2017  . Adjustment disorder with mixed disturbance of emotions and conduct [F43.25] 04/18/2017  . Accidental drug overdose [T50.901A]     Total Time spent with patient: 30 minutes  Musculoskeletal: Strength & Muscle Tone: within normal limits Gait & Station: normal Patient leans: N/A  Psychiatric Specialty Exam: Review of Systems  All other systems reviewed and are negative.   Blood pressure 121/81, pulse 90, temperature 98.1 F (36.7 C), temperature source Oral, resp. rate 16, height  (1.727 m), weight 62.1 kg (137 lb), SpO2 100 %.Body mass index is 20.83 kg/m.  General Appearance: Casual  Eye Contact::  Good  Speech:  Normal Rate409  Volume:  Normal  Mood:  Euthymic  Affect:  Appropriate  Thought Process:  Coherent  Orientation:  Full (Time, Place, and Person)  Thought Content:  Logical  Suicidal Thoughts:  No  Homicidal Thoughts:  No  Memory:  Immediate;   Fair  Judgement:  Good  Insight:  Fair  Psychomotor Activity:  Normal  Concentration:  Fair  Recall:  Good  Fund of Knowledge:Good  Language: Good  Akathisia:  Negative  Handed:  Right  AIMS (if indicated):     Assets:  Communication Skills Desire for Improvement  Sleep:  Number of Hours: 6  Cognition: WNL  ADL's:  Intact   Mental Status Per Nursing Assessment::   On Admission:  NA  Demographic Factors:  Male, Low socioeconomic status and Unemployed  Loss Factors: Financial problems/change in socioeconomic status  Historical Factors: Impulsivity  Risk Reduction Factors:   Positive coping skills or problem solving skills  Continued Clinical Symptoms:  Alcohol/Substance  Abuse/Dependencies  Cognitive Features That Contribute To Risk:  None    Suicide Risk:  Minimal: No identifiable suicidal ideation.  Patients presenting with no risk factors but with morbid ruminations; may be classified as minimal risk based on the severity of the depressive symptoms  Follow-up Information    [facility] Follow up on 07/19/2017.   Specialty:  Behavioral Health Why:  Hospital follow-up on Friday, 5/24 at 8:00AM. Please bring: photo ID and Medicaid card to this appt. Thank you.  Contact information: [address] [place] [address] [phone]           Plan Of Care/Follow-up recommendations:  Activity:  ad lib  Antonieta Pert, MD 07/16/2017, 7:47 AM

## 2017-07-16 NOTE — Progress Notes (Signed)
Patient ID: Chris Olson, male   DOB: 1991/02/21, 27 y.o.   MRN: [id number]  Nursing Progress Note [id number]  Data: Patient presents with bright, animated affect and pleasant mood. Patient endorses mild anxiety about discharge but states, "I think I'm ready to take the leap". Patient complaint with scheduled medications. Patient denies SI/HI/AVH or pain. Patient contracts for safety on the unit at this time. Patient completed self-inventory sheet and rates depression, hopelessness, and anxiety 0,0,0 respectively. Patient rates their sleep and appetite as good/good respectively. Patient states goal for today is to "discharge". Patient is seen visible in the milieu.  Action: Patient educated about and provided medication per provider's orders. Patient safety maintained with q15 min safety checks. Low fall risk precautions in place. Emotional support given. 1:1 interaction and active listening provided. Patient encouraged to attend meals and groups. Labs, vital signs and patient behavior monitored throughout shift. Patient encouraged to work on treatment plan and goals.  Response: Patient remains safe on the unit at this time. Patient is interacting with peers appropriately on the unit. Will continue to support and monitor.

## 2017-07-16 NOTE — Progress Notes (Signed)
Patient ID: Chris Olson, male   DOB: [birth year], 27 y.o.   MRN: [id number]  Discharge Note  D) Patient discharged to lobby on 07/16/2017 at 10:00 AM. Patient states readiness for discharge. Patient denies SI/HI, AVH and is not delusional or psychotic. Patient in no acute distress. Patient has completed their Suicide Safety Plan. Patient provided an opportunity to complete and return Patient Satisfaction Survey.  A) Written and verbal discharge instructions given to the patient. Patient accepting to information and verbalized understanding. Patient agrees to the discharge plan. Opportunity for questions and concerns presented to patient. Patient denied any further questions or concerns. All belongings returned to patient. Patient signed for return of belongings and discharge paperwork. Patient provided a copy of their Suicide Safety Plan and has been provided a copy of the Patient Satisfaction Survey with return instructions.  R) Patient safely escorted to the lobby. Patient discharged from [facility] with prescriptions, personal belongings, follow-up care in place and discharge paperwork.

## 2017-07-16 NOTE — [facility] Suicide Risk Assessment (Signed)
[facility] INPATIENT:  Family/Significant Other Suicide Prevention Education  Suicide Prevention Education:  Patient Refusal for Family/Significant Other Suicide Prevention Education: The patient Chris Olson has refused to provide written consent for family/significant other to be provided Family/Significant Other Suicide Prevention Education during admission and/or prior to discharge.  Physician notified.  SPE completed with pt, as pt refused to consent to family contact. SPI pamphlet provided to pt and pt was encouraged to share information with support network, ask questions, and talk about any concerns relating to SPE. Pt denies access to guns/firearms and verbalized understanding of information provided. Mobile Crisis information also provided to pt.   Karime Scheuermann N Smart LCSW 07/16/2017, 8:59 AM

## 2017-07-16 NOTE — Discharge Summary (Signed)
Physician Discharge Summary Note  Patient:  Chris Olson is an 27 y.o., male MRN:  [id number] DOB:  [birth year] Patient phone:  [phone] (home)  Patient address:   [place] [address],  Total Time spent with patient: 45 minutes  Date of Admission:  07/10/2017 Date of Discharge: 07/16/2017  Reason for Admission:  overdose  Principal Problem: MDD (major depressive disorder), recurrent severe, without psychosis [facility]) Discharge Diagnoses: Patient Active Problem List   Diagnosis Date Noted  . MDD (major depressive disorder), recurrent severe, without psychosis ([place]) [F33.2] 07/10/2017    Priority: High  . Accidental drug overdose [T50.901A]     Past Psychiatric History: depression, substance abuse  Past Medical History:  Past Medical History:  Diagnosis Date  . Chest pain   . Pericarditis    History reviewed. No pertinent surgical history. Family History: History reviewed. No pertinent family history. Family Psychiatric  History: none Social History:  Social History   Substance and Sexual Activity  Alcohol Use Yes   Comment: social use -- BAC was clear     Social History   Substance and Sexual Activity  Drug Use Yes  . Types: Marijuana   Comment: UDS was clear    Social History   Socioeconomic History  . Marital status: Married    Spouse name: Not on file  . Number of children: Not on file  . Years of education: Not on file  . Highest education level: Not on file  Occupational History  . Not on file  Social Needs  . Financial resource strain: Not on file  . Food insecurity:    Worry: Not on file    Inability: Not on file  . Transportation needs:    Medical: Not on file    Non-medical: Not on file  Tobacco Use  . Smoking status: Current Every Day Smoker    Types: Cigarettes  . Smokeless tobacco: Never Used  Substance and Sexual Activity  . Alcohol use: Yes    Comment: social use -- BAC was clear  . Drug use: Yes    Types:  Marijuana    Comment: UDS was clear  . Sexual activity: Not Currently  Lifestyle  . Physical activity:    Days per week: Not on file    Minutes per session: Not on file  . Stress: Not on file  Relationships  . Social connections:    Talks on phone: Not on file    Gets together: Not on file    Attends religious service: Not on file    Active member of club or organization: Not on file    Attends meetings of clubs or organizations: Not on file    Relationship status: Not on file  Other Topics Concern  . Not on file  Social History Narrative  . Not on file    Hospital Course:  On admission 07/11/2017:  This is yet another admission assessment for this 27 year old AA male with hx of depression & substance use disorder. He is known in this hospital from previous admissions. He was discharged from this hospital on 04-07-17 & 04-25-17 respectively after receiving mood stabilization treatments. Upon bother discharged, he set-up with appointments to follow-up care at [facility] for medication management & routine psychiatric care. He is known to be noncompliant to his treatment regimen.following -up with his outpatient appoints. [name] is being re-admitted to the [facility] this time around with complaints of worsening symptoms of depression & suicidal ideations x  several weeks. He cited poor familial relationship & marital separation as his stressor.  (Per Md's admission SRA): 27 year old male,separated,has two children who are with their mom, has been staying with a friend. Unemployed, on disability. Reports worsening depression with suicidal ideations, with thoughts of overdosing or ingesting rat poison. Reports anhedonia, low energy, SI, denies changes in sleep or appetite. Denies psychotic symptoms. Identifies separation, being unable to see his children, poor support network as significant stressors.Reports he is drinking almost daily, in variable amounts, sometimes more than 12 beers per day. Reports  occasional cannabis use . Admission UDS positive for cannabis , admission BAL negative. States he stopped taking psychiatric medications ( Seroquel, Zoloft ) several weeks ago. Patient reports history of depression, history of several prior psychiatric admissions, most recently here at [facility] in 2/19 for depression and substance abuse. Reports history of alcohol and cannabis abuse, denies history of seizures or DTs.   Medications:  Started Librium alcohol detox protocol, Seroquel 100 mg at bedtime for mood and sleep, Zoloft 50 mg at bedtime for depression, Hydroxyzine 25 mg every six hours PRN anxiety, and Trazodone 50 mg at bedtime PRN sleep  07/12/2017:  Subjective:  Reports some improvement , today reports anxiety and is focused on disposition planning, states he is between trying to go to a rehab or going to an [employer] setting at discharge.  Denies medication side effects. Objective: I have discussed case with treatment team and have met with patient. 27 years old male, on disability, had been staying at a friend's prior to admission. Presented due to depression and suicidal ideations of ingesting poison. Reported heavy, daily drinking prior to admission. Today presents with partially improved mood and range of affect. Remains depressed, affect is less irritable, less dysphoric, becoming more reactive. At this time denies suicidal ideations, and presents future oriented, focusing on possible disposition plan options, as above. Denies medication side effects.  At this time he is not presenting with symptoms of WDL- no tremors, no diaphoresis, no restlessness or agitation, vitals stable .  Medications: No changes made  07/13/2017:  Subjective: Patient reports that he is feeling good today.  He denies any SI/HI/AVH and contracts for safety.  Patient denies any medication side effects and denies any withdrawal symptoms today.  Patient reports he is hoping to be able to go to a residential program for  30-90 days potentially hoping to get into one that is longer.  He is not opposed to going to any residential program at this time.  Objective: Patient's chart and findings reviewed and discussed with treatment team.  Patient presents in his room lying in his bed and he refuses to uncover his head to speak with me patient is not manic but very minimal contact, he looked out from underneath the blankets to see who I was.  Patient is pleasant and cooperative though.  We will continue current medications as prescribed.  Will notify CSW of patient requesting information for residential program  Medications:  Discontinued Librium protocol not needed for the past 48 hours  07/14/2017:  Subjective: Patient states that he is feeling good today and has no complaints.  He states that he slept well last night and has had a good appetite.  He reports going to groups and interacting with other peers.  He states that he denies any SI/HI/AVH and contracts for safety.  He is just wanting to go to residential rehabilitation and is waiting for some assistance from [place] for that.  He asks about [facility]  [place].  Objective: Patient's chart and findings reviewed and discussed with treatment team.  Patient patient is seen to be a little more active today than he was yesterday as he has been in groups and interacting with peers and staff in the hallway and in the day room.  Patient has been appropriate as well as being pleasant and cooperative.  Encouraged patient to communicate with CSW about his future plans with rehabilitation treatment.  We will continue current medications as prescribed.  Medications:  No changes made  07/15/2017:  Subjective: Patient states "I'm doing OK. I feel like I"m improving a lot in terms of my depression. I'm hoping I can get into [facility] so I'm waiting to hear about that."   Objective: Pt seen and chart reviewed. Pt is alert/oriented x4, calm, cooperative, and appropriate to  situation. Pt denies suicidal/homicidal ideation and psychosis and does not appear to be responding to internal stimuli. Pt reports that he is motivated to change and feels that [facility] would be a good option for him in terms of getting better long-term. He states he has been there before and it was very effective.   Medications:  No changes made  07/16/2017: Patient has met maximum benefit of hospitalization. Denies suicidal/homicidal ideations, hallucinations, and withdrawal symptoms. Discharge instructions provided with explanations along with crisis numbers, follow-up appointment, and Rx. Stable for discharge.  Physical Findings: AIMS: Facial and Oral Movements Muscles of Facial Expression: None, normal Lips and Perioral Area: None, normal Jaw: None, normal Tongue: None, normal,Extremity Movements Upper (arms, wrists, hands, fingers): None, normal Lower (legs, knees, ankles, toes): None, normal, Trunk Movements Neck, shoulders, hips: None, normal, Overall Severity Severity of abnormal movements (highest score from questions above): None, normal Incapacitation due to abnormal movements: None, normal Patient's awareness of abnormal movements (rate only patient's report): No Awareness, Dental Status Current problems with teeth and/or dentures?: No Does patient usually wear dentures?: No  CIWA:  CIWA-Ar Total: 1 COWS:     Musculoskeletal: Strength & Muscle Tone: within normal limits Gait & Station: normal Patient leans: N/A  Psychiatric Specialty Exam: Review of Systems  All other systems reviewed and are negative.   Blood pressure 121/81, pulse 90, temperature 98.1 F (36.7 C), temperature source Oral, resp. rate 16, height 5' 8"  (1.727 m), weight 62.1 kg (137 lb), SpO2 100 %.Body mass index is 20.83 kg/m.  General Appearance: Casual  Eye Contact::  Good  Speech:  Normal Rate409  Volume:  Normal  Mood:  Euthymic  Affect:  Appropriate  Thought Process:  Coherent   Orientation:  Full (Time, Place, and Person)  Thought Content:  Logical  Suicidal Thoughts:  No  Homicidal Thoughts:  No  Memory:  Immediate;   Fair  Judgement:  Good  Insight:  Fair  Psychomotor Activity:  Normal  Concentration:  Fair  Recall:  Good  Fund of Knowledge:Good  Language: Good  Akathisia:  Negative  Handed:  Right  AIMS (if indicated):     Assets:  Communication Skills Desire for Improvement  Sleep:  Number of Hours: 6  Cognition: WNL  ADL's:  Intact    Have you used any form of tobacco in the last 30 days? (Cigarettes, Smokeless Tobacco, Cigars, and/or Pipes): Yes  Has this patient used any form of tobacco in the last 30 days? (Cigarettes, Smokeless Tobacco, Cigars, and/or Pipes) Yes, Yes, A prescription for an FDA-approved tobacco cessation medication was offered at discharge and  the patient refused  Blood Alcohol level:  Lab Results  Component Value Date   ETH <10 07/10/2017   ETH 103 (H) 82/99/3716    Metabolic Disorder Labs:  Lab Results  Component Value Date   HGBA1C 5.2 04/05/2017   MPG 102.54 04/05/2017   MPG 94 09/11/2016   No results found for: PROLACTIN Lab Results  Component Value Date   CHOL 165 04/05/2017   TRIG 213 (H) 04/05/2017   HDL 44 04/05/2017   CHOLHDL 3.8 04/05/2017   VLDL 43 (H) 04/05/2017   LDLCALC 78 04/05/2017    See Psychiatric Specialty Exam and Suicide Risk Assessment completed by Attending Physician prior to discharge.  Discharge destination:  Other:  Ready to Change  Is patient on multiple antipsychotic therapies at discharge:  No   Has Patient had three or more failed trials of antipsychotic monotherapy by history:  No  Recommended Plan for Multiple Antipsychotic Therapies: NA  Discharge Instructions    Diet - low sodium heart healthy   Complete by:  As directed    Discharge instructions   Complete by:  As directed    Take all medications as prescribed. Keep all follow-up appointments as scheduled.  Do  not consume alcohol or use illegal drugs while on prescription medications. Report any adverse effects from your medications to your primary care provider promptly.  In the event of recurrent symptoms or worsening symptoms, call 911, a crisis hotline, or go to the nearest emergency department for evaluation.   Increase activity slowly   Complete by:  As directed      Allergies as of 07/16/2017   No Known Allergies     Medication List    TAKE these medications     Indication  hydrOXYzine 25 MG tablet Commonly known as:  ATARAX/VISTARIL Take 1 tablet (25 mg total) by mouth every 6 (six) hours as needed for anxiety.  Indication:  Feeling Anxious   nicotine polacrilex 2 MG gum Commonly known as:  NICORETTE Take 1 each (2 mg total) by mouth as needed for smoking cessation. What changed:  additional instructions  Indication:  Nicotine Addiction   QUEtiapine 100 MG tablet Commonly known as:  SEROQUEL Take 1 tablet (100 mg total) by mouth at bedtime. What changed:  additional instructions  Indication:  Depressive Phase of Manic-Depression, Mood control   sertraline 50 MG tablet Commonly known as:  ZOLOFT Take 1 tablet (50 mg total) by mouth daily. What changed:    medication strength  how much to take  additional instructions  Indication:  Major Depressive Disorder   traZODone 50 MG tablet Commonly known as:  DESYREL Take 1 tablet (50 mg total) by mouth at bedtime as needed for sleep.  Indication:  Trouble Sleeping      Follow-up Information    [facility] Follow up on 07/19/2017.   Specialty:  Behavioral Health Why:  Hospital follow-up on Friday, 5/24 at 8:00AM. Please bring: photo ID and Medicaid card to this appt. Thank you.  Contact information: [place] [address] [phone]        [facility] Follow up on 07/15/2017.   Why:  You have been accepted into this program. Please call Satira Sark [phone]) to arrange transport from the  hospital. Thank you.  Contact information: [place] [address] [phone]           Follow-up recommendations:  Activity:  as tolerated Diet:  heart healthy diet  Comments:  Follow-up with appointment above  Signed: Waylan Boga, NP 07/16/2017, 9:28 AM

## 2017-08-11 ENCOUNTER — Encounter (HOSPITAL_COMMUNITY): Payer: Self-pay | Admitting: Nurse Practitioner

## 2017-08-11 ENCOUNTER — Emergency Department (HOSPITAL_COMMUNITY)
Admission: EM | Admit: 2017-08-11 | Discharge: 2017-08-12 | Disposition: A | Discharge status: Home / Self Care | Payer: Medicaid Other | Attending: Emergency Medicine | Admitting: Emergency Medicine

## 2017-08-11 DIAGNOSIS — R45851 Suicidal ideations: Secondary | ICD-10-CM | POA: Insufficient documentation

## 2017-08-11 DIAGNOSIS — F17210 Nicotine dependence, cigarettes, uncomplicated: Secondary | ICD-10-CM | POA: Diagnosis not present

## 2017-08-11 DIAGNOSIS — F329 Major depressive disorder, single episode, unspecified: Secondary | ICD-10-CM | POA: Diagnosis not present

## 2017-08-11 DIAGNOSIS — Z79899 Other long term (current) drug therapy: Secondary | ICD-10-CM | POA: Diagnosis not present

## 2017-08-11 DIAGNOSIS — F332 Major depressive disorder, recurrent severe without psychotic features: Secondary | ICD-10-CM | POA: Diagnosis present

## 2017-08-11 LAB — COMPREHENSIVE METABOLIC PANEL
ALT: 25 U/L (ref 17–63)
AST: 32 U/L (ref 15–41)
Albumin: 4.7 g/dL (ref 3.5–5.0)
Alkaline Phosphatase: 56 U/L (ref 38–126)
Anion gap: 8 (ref 5–15)
BUN: 9 mg/dL (ref 6–20)
CHLORIDE: 106 mmol/L (ref 101–111)
CO2: 27 mmol/L (ref 22–32)
Calcium: 9.2 mg/dL (ref 8.9–10.3)
Creatinine, Ser: 0.85 mg/dL (ref 0.61–1.24)
GFR calc Af Amer: >60 mL/min (ref >60)
Glucose, Bld: 84 mg/dL (ref 65–99)
POTASSIUM: 4 mmol/L (ref 3.5–5.1)
SODIUM: 141 mmol/L (ref 135–145)
Total Bilirubin: 0.6 mg/dL (ref 0.3–1.2)
Total Protein: 7.3 g/dL (ref 6.5–8.1)

## 2017-08-11 LAB — CBC WITH DIFFERENTIAL/PLATELET
Basophils Absolute: 0 10*3/uL (ref 0.0–0.1)
Basophils Relative: 1 %
EOS PCT: 5 %
Eosinophils Absolute: 0.4 10*3/uL (ref 0.0–0.7)
HCT: 46.1 % (ref 39.0–52.0)
HEMOGLOBIN: 16.1 g/dL (ref 13.0–17.0)
LYMPHS ABS: 2.9 10*3/uL (ref 0.7–4.0)
LYMPHS PCT: 34 %
MCH: 33.5 pg (ref 26.0–34.0)
MCHC: 34.9 g/dL (ref 30.0–36.0)
MCV: 96 fL (ref 78.0–100.0)
MONOS PCT: 5 %
Monocytes Absolute: 0.4 10*3/uL (ref 0.1–1.0)
NEUTROS PCT: 55 %
Neutro Abs: 4.8 10*3/uL (ref 1.7–7.7)
Platelets: 169 10*3/uL (ref 150–400)
RBC: 4.8 MIL/uL (ref 4.22–5.81)
RDW: 12.1 % (ref 11.5–15.5)
WBC: 8.5 10*3/uL (ref 4.0–10.5)

## 2017-08-11 LAB — ETHANOL: Alcohol, Ethyl (B): 134 mg/dL — ABNORMAL HIGH (ref <10)

## 2017-08-11 MED ORDER — HYDROXYZINE HCL 25 MG PO TABS: 25.0000 mg | ORAL_TABLET | Freq: Four times a day (QID) | ORAL | Status: DC | PRN

## 2017-08-11 MED ORDER — QUETIAPINE FUMARATE 100 MG PO TABS
100.0000 mg | ORAL_TABLET | Freq: Every day | ORAL | Status: DC
  Administered 2017-08-12: 100 mg via ORAL
  Filled 2017-08-11: qty 1

## 2017-08-11 MED ORDER — SERTRALINE HCL 50 MG PO TABS
50.0000 mg | ORAL_TABLET | Freq: Every day | ORAL | Status: DC
  Administered 2017-08-12: 50 mg via ORAL
  Filled 2017-08-11: qty 1

## 2017-08-11 MED ORDER — TRAZODONE HCL 50 MG PO TABS
50.0000 mg | ORAL_TABLET | Freq: Every evening | ORAL | Status: DC | PRN
Start: 2017-08-11 — End: 2017-08-12

## 2017-08-11 MED ORDER — NICOTINE 14 MG/24HR TD PT24: 14.0000 mg | MEDICATED_PATCH | Freq: Once | TRANSDERMAL | Status: DC

## 2017-08-11 NOTE — ED Triage Notes (Signed)
Pt states he is tired of everything and fo everyone being against him. States he also has thoughts of harming himself and everyone else.

## 2017-08-11 NOTE — ED Notes (Signed)
Bed: WTR7 Expected date:  Expected time:  Means of arrival:  Comments: 

## 2017-08-11 NOTE — ED Provider Notes (Signed)
[facility]-EMERGENCY DEPT Provider Note   CSN: [id number] Arrival date & time: 08/11/17  1948     History   Chief Complaint Chief Complaint  Patient presents with  . Psychiatric Evaluation    HPI Chris Olson is a 27 y.o. male.  The history is provided by the patient and medical records. No language interpreter was used.   Chris Olson is a 27 y.o. male  with a PMH of depression who presents to the Emergency Department complaining of suicidal thoughts.  Patient states that he became very depressed today as it is Father's Day and he was unable to see his children.  He felt hopeless and did not see the point in living anymore.  He states that he is just " tired of everything" and feel like everyone is against him.  He does want to get help and get better. His father brought him to the emergency department today for concerns of his safety.   Past Medical History:  Diagnosis Date  . Chest pain   . Pericarditis     Patient Active Problem List   Diagnosis Date Noted  . MDD (major depressive disorder), recurrent severe, without psychosis ([facility]) 07/10/2017  . Accidental drug overdose     History reviewed. No pertinent surgical history.      Home Medications    Prior to Admission medications   Medication Sig Start Date End Date Taking? Authorizing Provider  hydrOXYzine (ATARAX/VISTARIL) 25 MG tablet Take 1 tablet (25 mg total) by mouth every 6 (six) hours as needed for anxiety. 07/15/17  Yes Oneta RackLewis, Tanika N, NP  nicotine polacrilex (NICORETTE) 2 MG gum Take 1 each (2 mg total) by mouth as needed for smoking cessation. 07/15/17  Yes Oneta RackLewis, Tanika N, NP  QUEtiapine (SEROQUEL) 100 MG tablet Take 1 tablet (100 mg total) by mouth at bedtime. 07/15/17  Yes Oneta RackLewis, Tanika N, NP  sertraline (ZOLOFT) 50 MG tablet Take 1 tablet (50 mg total) by mouth daily. 07/16/17  Yes Oneta RackLewis, Tanika N, NP  traZODone (DESYREL) 50 MG tablet Take 1 tablet (50 mg total) by mouth at  bedtime as needed for sleep. Patient not taking: Reported on 08/11/2017 07/15/17   Oneta RackLewis, Tanika N, NP    Family History History reviewed. No pertinent family history.  Social History Social History   Tobacco Use  . Smoking status: Current Every Day Smoker    Types: Cigarettes  . Smokeless tobacco: Never Used  Substance Use Topics  . Alcohol use: Yes    Comment: social use -- BAC was clear  . Drug use: Yes    Types: Marijuana    Comment: UDS was clear     Allergies   Patient has no known allergies.   Review of Systems Review of Systems  Psychiatric/Behavioral: Positive for suicidal ideas.  All other systems reviewed and are negative.    Physical Exam Updated Vital Signs BP 124/83 (BP Location: Right Arm)   Pulse 66   Temp 98.6 F (37 C) (Oral)   Resp 18   SpO2 99%   Physical Exam  Constitutional: He is oriented to person, place, and time. He appears well-developed and well-nourished. No distress.  HENT:  Head: Normocephalic and atraumatic.  Cardiovascular: Normal rate, regular rhythm and normal heart sounds.  No murmur heard. Pulmonary/Chest: Effort normal and breath sounds normal. No respiratory distress.  Abdominal: Soft. He exhibits no distension. There is no tenderness.  Musculoskeletal: He exhibits no edema.  Neurological: He is alert and  oriented to person, place, and time.  Skin: Skin is warm and dry.  Nursing note and vitals reviewed.    ED Treatments / Results  Labs (all labs ordered are listed, but only abnormal results are displayed) Labs Reviewed  ETHANOL - Abnormal; Notable for the following components:      Result Value   Alcohol, Ethyl (B) 134 (*)    All other components within normal limits  COMPREHENSIVE METABOLIC PANEL  CBC WITH DIFFERENTIAL/PLATELET  RAPID URINE DRUG SCREEN, HOSP PERFORMED    EKG None  Radiology No results found.  Procedures Procedures (including critical care time)  Medications Ordered in  ED Medications  hydrOXYzine (ATARAX/VISTARIL) tablet 25 mg (has no administration in time range)  QUEtiapine (SEROQUEL) tablet 100 mg (has no administration in time range)  sertraline (ZOLOFT) tablet 50 mg (has no administration in time range)  traZODone (DESYREL) tablet 50 mg (has no administration in time range)  nicotine (NICODERM CQ - dosed in mg/24 hours) patch 14 mg (has no administration in time range)     Initial Impression / Assessment and Plan / ED Course  I have reviewed the triage vital signs and the nursing notes.  Pertinent labs & imaging results that were available during my care of the patient were reviewed by me and considered in my medical decision making (see chart for details).    Chris Olson is a 27 y.o. male who presents to ED for suicidal thoughts.  Labs reviewed and reassuring.  Medically cleared with disposition per psychiatry recommendations.   Final Clinical Impressions(s) / ED Diagnoses   Final diagnoses:  Suicidal ideation    ED Discharge Orders    None       Evony Rezek, Chase Picket, PA-C 08/11/17 2344    Vanetta Mulders, MD 08/14/17 2048

## 2017-08-12 LAB — RAPID URINE DRUG SCREEN, HOSP PERFORMED
Amphetamines: NOT DETECTED
Benzodiazepines: NOT DETECTED
Cocaine: NOT DETECTED
Opiates: NOT DETECTED
Tetrahydrocannabinol: POSITIVE — AB

## 2017-08-12 NOTE — BH Assessment (Addendum)
Assessment Note  Chris Olson is an 27 y.o. male who presents to the ED voluntarily. Pt is vague during the assessment responding with one word. Pt admits that he has thoughts of suicide but denies a plan at present. Pt was recently d/c from [facility] on 07/16/17 c/o suicidal ideations. Pt currently resides at [facility]. Pt's current BAL is 134 and pt admits to drinking alcohol daily. Pt reports his current stressors include not being able to see his children on Fathers' Day. Pt states he "feels down" and states he is "tired of feeling down." Pt is falling asleep throughout the assessment and often has to have his name called in order to wake up and answer TTS questions.   TTS consulted with Donell Sievert, PA who recommends continued observation to be reassessed in the AM by psych. EDP Melvenia Beam, PA has been advised.  Diagnosis: MDD, recurrent, severe, w/o psychosis; Alcohol use disorder, severe   Past Medical History:  Past Medical History:  Diagnosis Date  . Chest pain   . Pericarditis     History reviewed. No pertinent surgical history.  Family History: History reviewed. No pertinent family history.  Social History:  reports that he has been smoking cigarettes.  He has never used smokeless tobacco. He reports that he drinks alcohol. He reports that he has current or past drug history. Drug: Marijuana.  Additional Social History:  Alcohol / Drug Use Pain Medications: See MAR Prescriptions: See MAR Over the Counter: See MAR History of alcohol / drug use?: Yes Longest period of sobriety (when/how long): unknown Negative Consequences of Use: Financial, Legal, Personal relationships Substance #1 Name of Substance 1: alcohol 1 - Age of First Use: teens 1 - Amount (size/oz): varies, Current BAL 134 1 - Frequency: daily 1 - Duration: ongoing 1 - Last Use / Amount: 08/11/17  CIWA: CIWA-Ar BP: 124/83 Pulse Rate: 66 COWS:    Allergies: No Known Allergies  Home  Medications:  (Not in a hospital admission)  OB/GYN Status:  No LMP for male patient.  General Assessment Data [facility] of Assessment: [facility] TTS Assessment: In system Is this a Tele or Face-to-Face Assessment?: Face-to-Face Is this an Initial Assessment or a Re-assessment for this encounter?: Initial Assessment Marital status: Divorced Is patient pregnant?: No Pregnancy Status: No Living Arrangements: Other (Comment)([facility]) Can pt return to current living arrangement?: Yes Admission Status: Voluntary Is patient capable of signing voluntary admission?: Yes Referral Source: Self/Family/Friend Insurance type: Medicaid     Crisis Care Plan Living Arrangements: Other (Comment)([facility]) Name of Psychiatrist: none Name of Therapist: none  Education Status Is patient currently in school?: No Is the patient employed, unemployed or receiving disability?: Receiving disability income  Risk to self with the past 6 months Suicidal Ideation: Yes-Currently Present Has patient been a risk to self within the past 6 months prior to admission? : Yes Suicidal Intent: No-Not Currently/Within Last 6 Months Has patient had any suicidal intent within the past 6 months prior to admission? : Yes Is patient at risk for suicide?: Yes Suicidal Plan?: No-Not Currently/Within Last 6 Months Has patient had any suicidal plan within the past 6 months prior to admission? : Yes Access to Means: Yes Specify Access to Suicidal Means: in the past, pt attempted to OD on medication, pt has access to medication  What has been your use of drugs/alcohol within the last 12 months?: daily alcohol use  Previous Attempts/Gestures: Yes How many times?: 1 Triggers for  Past Attempts: Unpredictable Intentional Self Injurious Behavior: None Family Suicide History: No Recent stressful life event(s): Divorce, Loss (Comment), Turmoil (Comment)(substance abuse, not able to see children ) Persecutory  voices/beliefs?: No Depression: Yes Depression Symptoms: Insomnia, Despondent, Feeling worthless/self pity, Loss of interest in usual pleasures Substance abuse history and/or treatment for substance abuse?: Yes Suicide prevention information given to non-admitted patients: Not applicable  Risk to Others within the past 6 months Homicidal Ideation: No Does patient have any lifetime risk of violence toward others beyond the six months prior to admission? : No Thoughts of Harm to Others: No Current Homicidal Intent: No Current Homicidal Plan: No Access to Homicidal Means: No History of harm to others?: No Assessment of Violence: None Noted Does patient have access to weapons?: No Criminal Charges Pending?: No Does patient have a court date: No Is patient on probation?: No  Psychosis Hallucinations: None noted Delusions: None noted  Mental Status Report Appearance/Hygiene: In scrubs, Unremarkable Eye Contact: Fair Motor Activity: Freedom of movement Speech: Logical/coherent Level of Consciousness: Drowsy Mood: Depressed Affect: Depressed, Flat, Blunted Anxiety Level: None Thought Processes: Relevant, Coherent Judgement: Impaired Orientation: Person, Place, Time, Situation, Appropriate for developmental age Obsessive Compulsive Thoughts/Behaviors: None  Cognitive Functioning Concentration: Normal Memory: Remote Intact, Recent Intact Is patient IDD: No Is patient DD?: No Insight: Poor Impulse Control: Poor Appetite: Fair Have you had any weight changes? : No Change Sleep: Decreased Total Hours of Sleep: 6 Vegetative Symptoms: None  ADLScreening [facility] Assessment Services) Patient's cognitive ability adequate to safely complete daily activities?: Yes Patient able to express need for assistance with ADLs?: Yes Independently performs ADLs?: Yes (appropriate for developmental age)  Prior Inpatient Therapy Prior Inpatient Therapy: Yes Prior Therapy Dates: 2019, multiple  admissions Prior Therapy Facilty/Provider(s): [facility] Reason for Treatment: MDD, SA  Prior Outpatient Therapy Prior Outpatient Therapy: Yes Prior Therapy Dates: 2018 Prior Therapy Facilty/Provider(s): [facility] Reason for Treatment: MED MANAGEMENT, MH CONCERNS Does patient have an ACCT team?: No Does patient have Intensive In-House Services?  : No Does patient have [facility] services? : No Does patient have P4CC services?: No  ADL Screening (condition at time of admission) Patient's cognitive ability adequate to safely complete daily activities?: Yes Is the patient deaf or have difficulty hearing?: No Does the patient have difficulty seeing, even when wearing glasses/contacts?: No Does the patient have difficulty concentrating, remembering, or making decisions?: No Patient able to express need for assistance with ADLs?: Yes Does the patient have difficulty dressing or bathing?: No Independently performs ADLs?: Yes (appropriate for developmental age) Does the patient have difficulty walking or climbing stairs?: No Weakness of Legs: None Weakness of Arms/Hands: None  Home Assistive Devices/Equipment Home Assistive Devices/Equipment: None    Abuse/Neglect Assessment (Assessment to be complete while patient is alone) Abuse/Neglect Assessment Can Be Completed: Yes Physical Abuse: Denies Verbal Abuse: Denies Sexual Abuse: Denies Exploitation of patient/patient's resources: Denies Self-Neglect: Denies     Merchant navy officer (For Healthcare) Does Patient Have a Medical Advance Directive?: No Would patient like information on creating a medical advance directive?: No - Patient declined    Additional Information 1:1 In Past 12 Months?: No CIRT Risk: No Elopement Risk: No Does patient have medical clearance?: Yes     Disposition: TTS consulted with Donell Sievert, PA who recommends continued observation to be reassessed in the AM by psych. EDP Melvenia Beam, PA has been  advised. Disposition Initial Assessment Completed for this Encounter: Yes Disposition of Patient: (overnight OBS pending AM psych assessment ) Patient refused  recommended treatment: No  On Site Evaluation by:   Reviewed with Physician:    Karolee OhsAquicha R Rayman Petrosian 08/12/2017 12:37 AM

## 2017-08-12 NOTE — BH Assessment (Signed)
Chris Olson Assessment Progress Note  Per Juanetta BeetsJacqueline Norman, DO, this pt does not require psychiatric hospitalization at this time.  [facility] to continue treatment with [facility], and [facility].  This has been included in pt's discharge instructions.  Pt's nurse, Kendal Hymendie, has been notified.  Chris Canninghomas Wassim Kirksey, MA Triage Specialist [phone]

## 2017-08-12 NOTE — BHH Suicide Risk Assessment (Cosign Needed)
Suicide Risk Assessment  Discharge Assessment   [facility]   Principal Problem: MDD (major depressive disorder), recurrent severe, without psychosis (HCC) Discharge Diagnoses:  Patient Active Problem List   Diagnosis Date Noted  . MDD (major depressive disorder), recurrent severe, without psychosis (HCC) [F33.2] 07/10/2017  . Accidental drug overdose [T50.901A]    Pt was seen and chart reviewed with treatment team and Dr Sharma CovertNorman. Pt denies suicidal/homicidal ideation, denies auditory/visual hallucinations and does not appear to be responding to internal stimuli. Pt stated he was just tired of feeling down all the time and he was just upset yesterday. [facility]. Pt is compliant with his medication and [facility]. Pt will continue to live at [facility] and [facility]. Pt is stable and psychiatrically clear for discharge.  Total Time spent with patient: 30 minutes  Musculoskeletal: Strength & Muscle Tone: within normal limits Gait & Station: normal Patient leans: Right  Psychiatric Specialty Exam:   Blood pressure (!) 99/54, pulse (!) 15, temperature 98.2 F (36.8 C), temperature source Oral, resp. rate (!) 67, SpO2 99 %.There is no height or weight on file to calculate BMI.  General Appearance: Casual  Eye Contact::  Fair  Speech:  Clear and Coherent409  Volume:  Normal  Mood:  Depressed  Affect:  Congruent and Depressed  Thought Process:  Coherent, Goal Directed and Linear  Orientation:  Full (Time, Place, and Person)  Thought Content:  Logical  Suicidal Thoughts:  No  Homicidal Thoughts:  No  Memory:  Immediate;   Good Recent;   Good Remote;   Fair  Judgement:  Fair  Insight:  Fair  Psychomotor Activity:  Decreased  Concentration:  Good  Recall:  Good  Fund of Knowledge:Good  Language: Good  Akathisia:  No  Handed:   Right  AIMS (if indicated):     Assets:  ArchitectCommunication Skills Financial Resources/Insurance Housing Physical Health Social Support  Sleep:     Cognition: WNL  ADL's:  Intact   Mental Status Per Nursing Assessment::   On Admission:   Suicidal ideation  Demographic Factors:  Male, Low socioeconomic status and Unemployed  Loss Factors: Financial problems/change in socioeconomic status  Historical Factors: Impulsivity  Risk Reduction Factors:   Responsible for children under 29 years of age and Sense of responsibility to family  Continued Clinical Symptoms:  Depression:   Impulsivity Alcohol/Substance Abuse/Dependencies  Cognitive Features That Contribute To Risk:  Closed-mindedness    Suicide Risk:  Minimal: No identifiable suicidal ideation.  Patients presenting with no risk factors but with morbid ruminations; may be classified as minimal risk based on the severity of the depressive symptoms    Plan Of Care/Follow-up recommendations:  Activity:  as tolerated Diet:  Heart healthy  Laveda AbbeLaurie Britton Parks, NP 08/12/2017, 11:36 AM

## 2017-08-12 NOTE — ED Notes (Signed)
Bed: WBH34 Expected date:  Expected time:  Means of arrival:  Comments: Hold for 32 

## 2017-08-12 NOTE — Discharge Instructions (Signed)
For your mental health needs, you are advised to follow up with [facility].  New and returning patients are seen at their walk-in clinic.  Walk-in hours are Monday - Friday from 8:00 am - 3:00 pm.  Walk-in patients are seen on a first come, first served basis.  Try to arrive as early as possible for he best chance of being seen the same day:       [facility]      [address]      [place], [place] [address]      [phone]  For other behavioral health needs, you are advised to continue treatment with [facility]:       [facility]      [address]      [place], [place] [address]      [phone]

## 2017-08-12 NOTE — ED Notes (Signed)
Bed: WA32 Expected date:  Expected time:  Means of arrival:  Comments: 

## 2017-08-12 NOTE — ED Notes (Signed)
Pt discharged safely with discharge instructions.  [facility] was called to pick pt up.  Pt was calm and cooperative, and denied S/I and H/I at discharge.  All belongings were returned to pt.

## 2017-08-12 NOTE — Progress Notes (Signed)
TTS consulted with Donell SievertSpencer Simon, PA who recommends continued observation to be reassessed in the AM by psych. EDP Melvenia BeamShari, PA has been advised.  Princess BruinsAquicha Makenzie Weisner, MSW, LCSW Therapeutic Triage Specialist  [phone]

## 2017-08-12 NOTE — ED Notes (Signed)
Pt oriented to room and unit.  Pt appears very sad .  Pt is pleasant and cooperative.  Pt contracts for safety.  He states he is just depressed all of the time even on his medications.  15 minute checks and video monitoring in place.

## 2017-08-12 NOTE — Progress Notes (Signed)
Attempted to call triage nurse to advise of overnight obs recommendation but did not receive an answer.  Princess BruinsAquicha Lorilynn Lehr, MSW, LCSW Therapeutic Triage Specialist  [phone]

## 2017-11-09 DIAGNOSIS — F1414 Cocaine abuse with cocaine-induced mood disorder: Secondary | ICD-10-CM | POA: Diagnosis not present

## 2017-11-09 DIAGNOSIS — F17210 Nicotine dependence, cigarettes, uncomplicated: Secondary | ICD-10-CM | POA: Diagnosis not present

## 2017-11-09 DIAGNOSIS — F419 Anxiety disorder, unspecified: Secondary | ICD-10-CM | POA: Diagnosis not present

## 2017-11-09 DIAGNOSIS — F329 Major depressive disorder, single episode, unspecified: Secondary | ICD-10-CM | POA: Insufficient documentation

## 2017-11-09 DIAGNOSIS — R45851 Suicidal ideations: Secondary | ICD-10-CM | POA: Diagnosis not present

## 2017-11-09 DIAGNOSIS — Z79899 Other long term (current) drug therapy: Secondary | ICD-10-CM | POA: Diagnosis not present

## 2017-11-09 DIAGNOSIS — Z813 Family history of other psychoactive substance abuse and dependence: Secondary | ICD-10-CM | POA: Diagnosis not present

## 2017-11-09 DIAGNOSIS — F1410 Cocaine abuse, uncomplicated: Secondary | ICD-10-CM | POA: Diagnosis not present

## 2017-11-09 DIAGNOSIS — F209 Schizophrenia, unspecified: Secondary | ICD-10-CM | POA: Diagnosis not present

## 2017-11-09 DIAGNOSIS — Z818 Family history of other mental and behavioral disorders: Secondary | ICD-10-CM | POA: Diagnosis not present

## 2017-11-10 ENCOUNTER — Emergency Department (HOSPITAL_COMMUNITY)
Admission: EM | Admit: 2017-11-10 | Discharge: 2017-11-11 | Disposition: A | Discharge status: Home / Self Care | Payer: Medicaid Other | Attending: Emergency Medicine | Admitting: Emergency Medicine

## 2017-11-10 ENCOUNTER — Encounter (HOSPITAL_COMMUNITY): Payer: Self-pay | Admitting: *Deleted

## 2017-11-10 ENCOUNTER — Other Ambulatory Visit: Payer: Self-pay

## 2017-11-10 DIAGNOSIS — F1410 Cocaine abuse, uncomplicated: Secondary | ICD-10-CM

## 2017-11-10 DIAGNOSIS — F1414 Cocaine abuse with cocaine-induced mood disorder: Secondary | ICD-10-CM | POA: Diagnosis present

## 2017-11-10 HISTORY — DX: Anxiety disorder, unspecified: F41.9

## 2017-11-10 HISTORY — DX: Schizophrenia, unspecified: F20.9

## 2017-11-10 HISTORY — DX: Depression, unspecified: F32.A

## 2017-11-10 HISTORY — DX: Major depressive disorder, single episode, unspecified: F32.9

## 2017-11-10 LAB — COMPREHENSIVE METABOLIC PANEL
ALBUMIN: 4.6 g/dL (ref 3.5–5.0)
ALT: 17 U/L (ref 0–44)
AST: 24 U/L (ref 15–41)
Alkaline Phosphatase: 54 U/L (ref 38–126)
Anion gap: 8 (ref 5–15)
BUN: 15 mg/dL (ref 6–20)
CHLORIDE: 103 mmol/L (ref 98–111)
CO2: 29 mmol/L (ref 22–32)
CREATININE: 1.07 mg/dL (ref 0.61–1.24)
Calcium: 9.3 mg/dL (ref 8.9–10.3)
GFR calc non Af Amer: >60 mL/min (ref >60)
GLUCOSE: 95 mg/dL (ref 70–99)
Potassium: 3.7 mmol/L (ref 3.5–5.1)
Sodium: 140 mmol/L (ref 135–145)
Total Bilirubin: 0.7 mg/dL (ref 0.3–1.2)
Total Protein: 7.3 g/dL (ref 6.5–8.1)

## 2017-11-10 LAB — CBC
HCT: 45 % (ref 39.0–52.0)
HEMOGLOBIN: 15.3 g/dL (ref 13.0–17.0)
MCH: 32.6 pg (ref 26.0–34.0)
MCHC: 34 g/dL (ref 30.0–36.0)
MCV: 95.9 fL (ref 78.0–100.0)
Platelets: 151 10*3/uL (ref 150–400)
RBC: 4.69 MIL/uL (ref 4.22–5.81)
RDW: 12.8 % (ref 11.5–15.5)
WBC: 8.8 10*3/uL (ref 4.0–10.5)

## 2017-11-10 LAB — RAPID URINE DRUG SCREEN, HOSP PERFORMED
AMPHETAMINES: NOT DETECTED
Barbiturates: NOT DETECTED
Benzodiazepines: POSITIVE — AB
COCAINE: POSITIVE — AB
Opiates: NOT DETECTED
TETRAHYDROCANNABINOL: POSITIVE — AB

## 2017-11-10 LAB — ACETAMINOPHEN LEVEL

## 2017-11-10 LAB — SALICYLATE LEVEL

## 2017-11-10 LAB — ETHANOL: Alcohol, Ethyl (B): <10 mg/dL (ref <10)

## 2017-11-10 MED ORDER — CARIPRAZINE HCL 1.5 MG PO CAPS
1.5000 mg | ORAL_CAPSULE | Freq: Every day | ORAL | Status: DC
  Administered 2017-11-10 – 2017-11-11 (×2): 1.5 mg via ORAL
  Filled 2017-11-10 (×2): qty 1

## 2017-11-10 MED ORDER — VITAMIN B-1 100 MG PO TABS: 100.0000 mg | ORAL_TABLET | Freq: Every day | ORAL | Status: DC

## 2017-11-10 MED ORDER — LORAZEPAM 1 MG PO TABS: 0.0000 mg | ORAL_TABLET | Freq: Four times a day (QID) | ORAL | Status: DC

## 2017-11-10 MED ORDER — NICOTINE 21 MG/24HR TD PT24
21.0000 mg | MEDICATED_PATCH | Freq: Every day | TRANSDERMAL | Status: DC
  Administered 2017-11-10: 21 mg via TRANSDERMAL
  Filled 2017-11-10: qty 1

## 2017-11-10 MED ORDER — HYDROXYZINE HCL 25 MG PO TABS
25.0000 mg | ORAL_TABLET | Freq: Four times a day (QID) | ORAL | Status: DC | PRN
  Administered 2017-11-10 (×2): 25 mg via ORAL
  Filled 2017-11-10 (×2): qty 1

## 2017-11-10 MED ORDER — LORAZEPAM 1 MG PO TABS: 0.0000 mg | ORAL_TABLET | Freq: Two times a day (BID) | ORAL | Status: DC

## 2017-11-10 MED ORDER — THIAMINE HCL 100 MG/ML IJ SOLN: 100.0000 mg | Freq: Every day | INTRAMUSCULAR | Status: DC
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
## 2017-11-10 NOTE — ED Notes (Signed)
Affect flat, mood depressed, behavior appropriate. Pt reports feeling suicidal because he is always having setbacks in life with chronic depression. He does not endorse a plan.

## 2017-11-10 NOTE — ED Notes (Signed)
His skin is normal, warm and dry and he is breathing normally. He is comfortably sleeping.

## 2017-11-10 NOTE — ED Notes (Signed)
[facility] Expected date:  Expected time:  Means of arrival:  Comments: Hold for room 27

## 2017-11-10 NOTE — Progress Notes (Signed)
This patient continues to meet inpatient criteria. CSW faxed information to the following facilities:   [facility]   Enid Cutterharlotte Suriyah Vergara, [place]-A Clinical Social Worker [phone]

## 2017-11-10 NOTE — ED Notes (Signed)
Bed: WTR5 Expected date:  Expected time:  Means of arrival:  Comments: 

## 2017-11-10 NOTE — ED Triage Notes (Signed)
Pt stated "I'm just wanting to hurt myself.  I don't have a plan."

## 2017-11-10 NOTE — ED Provider Notes (Signed)
[facility]-EMERGENCY DEPT Provider Note   CSN: [id number] Arrival date & time: 11/09/17  2304  Time seen 02:40 AM   History   Chief Complaint Chief Complaint  Patient presents with  . Suicidal    HPI Chris Olson is a 27 y.o. male.  HPI patient states he has had a history of depression "my whole life".  He states it got worse a few days ago.  He states he was asked to leave where he was staying and now he is homeless.  He states he is suicidal and when asking what he would do he states he would OD on drugs or drive a car off a cliff.  He states he has harm to self in the past and states he had an overdose on medications in February.  He states he is on his medications and they do help.  PCP Patient, No Pcp Per Psychiatrist [facility]  Past Medical History:  Diagnosis Date  . Anxiety   . Chest pain   . Depression   . Pericarditis   . Schizophrenia [facility])     Patient Active Problem List   Diagnosis Date Noted  . MDD (major depressive disorder), recurrent severe, without psychosis (HCC) 07/10/2017  . Accidental drug overdose     History reviewed. No pertinent surgical history.      Home Medications    Prior to Admission medications   Medication Sig Start Date End Date Taking? Authorizing Provider  cariprazine (VRAYLAR) capsule Take 1.5 mg by mouth daily.   Yes [provider]  hydrOXYzine (ATARAX/VISTARIL) 25 MG tablet Take 1 tablet (25 mg total) by mouth every 6 (six) hours as needed for anxiety. 07/15/17  Yes Oneta Rack, NP  nicotine polacrilex (NICORETTE) 2 MG gum Take 1 each (2 mg total) by mouth as needed for smoking cessation. Patient not taking: Reported on 11/10/2017 07/15/17   Oneta Rack, NP  QUEtiapine (SEROQUEL) 100 MG tablet Take 1 tablet (100 mg total) by mouth at bedtime. Patient not taking: Reported on 11/10/2017 07/15/17   Oneta Rack, NP  sertraline (ZOLOFT) 50 MG tablet Take 1 tablet (50 mg total) by mouth  daily. Patient not taking: Reported on 11/10/2017 07/16/17   Oneta Rack, NP  traZODone (DESYREL) 50 MG tablet Take 1 tablet (50 mg total) by mouth at bedtime as needed for sleep. Patient not taking: Reported on 08/11/2017 07/15/17   Oneta Rack, NP    Family History No family history on file.  Social History Social History   Tobacco Use  . Smoking status: Current Every Day Smoker    Types: Cigarettes  . Smokeless tobacco: Never Used  Substance Use Topics  . Alcohol use: Yes    Comment: social use -- BAC was clear  . Drug use: Yes    Types: Marijuana, Cocaine    Comment: UDS was clear  employed On disability Homeless Smokes 1 ppd, drinks three 40 ounces of beer daily States he does cocaine occasionally, last time 2 days ago  Allergies   Patient has no known allergies.   Review of Systems Review of Systems  All other systems reviewed and are negative.    Physical Exam Updated Vital Signs BP 127/86   Pulse 79   Temp 98.2 F (36.8 C) (Oral)   Resp 17   Ht 5\' 8"  (1.727 m)   Wt 63.5 kg   SpO2 100%   BMI 21.29 kg/m   Vital signs normal  Physical Exam  Constitutional: He is oriented to person, place, and time. He appears well-developed and well-nourished.  Non-toxic appearance. He does not appear ill. No distress.  HENT:  Head: Normocephalic and atraumatic.  Right Ear: External ear normal.  Left Ear: External ear normal.  Nose: Nose normal. No mucosal edema or rhinorrhea.  Mouth/Throat: Oropharynx is clear and moist and mucous membranes are normal. No dental abscesses or uvula swelling.  Eyes: Pupils are equal, round, and reactive to light. Conjunctivae and EOM are normal.  Neck: Normal range of motion and full passive range of motion without pain. Neck supple.  Cardiovascular: Normal rate, regular rhythm and normal heart sounds. Exam reveals no gallop and no friction rub.  No murmur heard. Pulmonary/Chest: Effort normal and breath sounds normal. No  respiratory distress. He has no wheezes. He has no rhonchi. He has no rales. He exhibits no tenderness and no crepitus.  Abdominal: Soft. Normal appearance and bowel sounds are normal. He exhibits no distension. There is no tenderness. There is no rebound and no guarding.  Musculoskeletal: Normal range of motion. He exhibits no edema or tenderness.  Moves all extremities well.   Neurological: He is alert and oriented to person, place, and time. He has normal strength. No cranial nerve deficit.  Skin: Skin is warm, dry and intact. No rash noted. No erythema. No pallor.  Psychiatric: His affect is blunt. His speech is delayed. He is slowed.  Patient speaks very softly and is very hard to understand.  Nursing note and vitals reviewed.    ED Treatments / Results  Labs (all labs ordered are listed, but only abnormal results are displayed) Results for orders placed or performed during the hospital encounter of 11/10/17  Comprehensive metabolic panel  Result Value Ref Range   Sodium 140 135 - 145 mmol/L   Potassium 3.7 3.5 - 5.1 mmol/L   Chloride 103 98 - 111 mmol/L   CO2 29 22 - 32 mmol/L   Glucose, Bld 95 70 - 99 mg/dL   BUN 15 6 - 20 mg/dL   Creatinine, Ser 9.601.07 0.61 - 1.24 mg/dL   Calcium 9.3 8.9 - 45.410.3 mg/dL   Total Protein 7.3 6.5 - 8.1 g/dL   Albumin 4.6 3.5 - 5.0 g/dL   AST 24 15 - 41 U/L   ALT 17 0 - 44 U/L   Alkaline Phosphatase 54 38 - 126 U/L   Total Bilirubin 0.7 0.3 - 1.2 mg/dL   GFR calc non Af Amer >60 >60 mL/min   GFR calc Af Amer >60 >60 mL/min   Anion gap 8 5 - 15  Ethanol  Result Value Ref Range   Alcohol, Ethyl (B) <10 <10 mg/dL  Salicylate level  Result Value Ref Range   Salicylate Lvl <7.0 2.8 - 30.0 mg/dL  Acetaminophen level  Result Value Ref Range   Acetaminophen (Tylenol), Serum <10 (L) 10 - 30 ug/mL  cbc  Result Value Ref Range   WBC 8.8 4.0 - 10.5 K/uL   RBC 4.69 4.22 - 5.81 MIL/uL   Hemoglobin 15.3 13.0 - 17.0 g/dL   HCT 09.845.0 11.939.0 - 14.752.0 %    MCV 95.9 78.0 - 100.0 fL   MCH 32.6 26.0 - 34.0 pg   MCHC 34.0 30.0 - 36.0 g/dL   RDW 82.912.8 56.211.5 - 13.015.5 %   Platelets 151 150 - 400 K/uL  Rapid urine drug screen (hospital performed)  Result Value Ref Range   Opiates NONE DETECTED NONE DETECTED  Cocaine POSITIVE (A) NONE DETECTED   Benzodiazepines POSITIVE (A) NONE DETECTED   Amphetamines NONE DETECTED NONE DETECTED   Tetrahydrocannabinol POSITIVE (A) NONE DETECTED   Barbiturates NONE DETECTED NONE DETECTED   Laboratory interpretation all normal except positive UDS for cocaine    EKG None  Radiology No results found.  Procedures Procedures (including critical care time)  Medications Ordered in ED Medications  LORazepam (ATIVAN) tablet 0-4 mg (has no administration in time range)  LORazepam (ATIVAN) tablet 0-4 mg (has no administration in time range)  thiamine (VITAMIN B-1) tablet 100 mg (has no administration in time range)    Or  thiamine (B-1) injection 100 mg (has no administration in time range)  nicotine (NICODERM CQ - dosed in mg/24 hours) patch 21 mg (has no administration in time range)  cariprazine (VRAYLAR) capsule 1.5 mg (has no administration in time range)  hydrOXYzine (ATARAX/VISTARIL) tablet 25 mg (has no administration in time range)     Initial Impression / Assessment and Plan / ED Course  I have reviewed the triage vital signs and the nursing notes.  Pertinent labs & imaging results that were available during my care of the patient were reviewed by me and considered in my medical decision making (see chart for details).    4:04 AM patient was placed on psych holding orders and TTS consult was ordered.  He also was given nicotine patch and started on a CIWA protocol.  7 AM patient has had his TTS consult however their disposition has not been determined.  Final Clinical Impressions(s) / ED Diagnoses   Final diagnoses:  Suicidal ideation  Depression, unspecified depression type  Cocaine abuse  (HCC)    Disposition pending  Devoria Albe, MD, Concha Pyo, MD 11/10/17 [phone]

## 2017-11-10 NOTE — Progress Notes (Signed)
Pt in room in bed.  Pt sts he is depressed and sad.  Pt sts he is worried about life and feels he is unable to improve his life without help.  Pt asks about long term placement although he was in a program and left due to ETOH and cocaine abuse.  Pt has been living in his car for the last few days.  Pt denies SI, HI and AVH.  Pt verbally contracts for safety

## 2017-11-10 NOTE — BH Assessment (Signed)
Assessment Note  Chris Olson is an 27 y.o. male. Pt reports he is depressed and "tired of going through everything."  Pt reports he has been in the [facility] but that he had to leave his program housing yesterday, doesn't know why or what is going on, and has been staying in his car.  Pt reports he has been in [facility] for 5 months but that he has been using alcohol and cocaine 1-2x a week while in the program. Pt does not believe he is being discharge but cannot speak to anyone at [facility] until Monday.  Pt reports suicidal ideation, but then states "I always have suicidal thoughts."  Pt denies plan or intent but reports he does not feel safe.  Pt denies HI/AV.  Pt has had multiple prior admits to [facility] and to several substance use residential programs.  Diagnosis: Major depressive disorder, alcohol/cocaine use disorder  Past Medical History:  Past Medical History:  Diagnosis Date  . Anxiety   . Chest pain   . Depression   . Pericarditis   . Schizophrenia (HCC)     History reviewed. No pertinent surgical history.  Family History: No family history on file.  Social History:  reports that he has been smoking cigarettes. He has never used smokeless tobacco. He reports that he drinks alcohol. He reports that he has current or past drug history. Drugs: Marijuana and Cocaine.  Additional Social History:  Alcohol / Drug Use Pain Medications: See MAR Prescriptions: See MAR Over the Counter: See MAR History of alcohol / drug use?: Yes Negative Consequences of Use: Financial, Legal, Personal relationships Substance #1 Name of Substance 1: (Pt UDS also positive for benzos.) 1 - Age of First Use: 13 1 - Amount (size/oz): 40 oz beer 1 - Frequency: 1-2x per week 1 - Duration: 1-2 months 1 - Last Use / Amount: 2 days ago, 3-4 40 oz beers Substance #2 Name of Substance 2: cocaine 2 - Age of First Use: 15 2 - Amount (size/oz): 1/2 gram 2 - Frequency: 1-2x  week 2 - Duration: 1-2 months 2 - Last Use / Amount: 2 days ago, 1/2 gram  CIWA: CIWA-Ar BP: 127/86 Pulse Rate: 79 COWS:    Allergies: No Known Allergies  Home Medications:  (Not in a hospital admission)  OB/GYN Status:  No LMP for male patient.  General Assessment Data Location of Assessment: [facility] TTS Assessment: In system Is this a Tele or Face-to-Face Assessment?: Face-to-Face Is this an Initial Assessment or a Re-assessment for this encounter?: Initial Assessment Patient Accompanied by:: N/A Language Other than English: No What gender do you identify as?: Male Marital status: Separated Pregnancy Status: No Living Arrangements: Other (Comment)([facility]) Can pt return to current living arrangement?: (unknown) Admission Status: Voluntary Is patient capable of signing voluntary admission?: Yes Referral Source: Self/Family/Friend     Crisis Care Plan Living Arrangements: Other (Comment)([facility]) Name of Psychiatrist: [facility] Name of Therapist: [facility]  Education Status Is patient currently in school?: No Is the patient employed, unemployed or receiving disability?: Employed, Receiving disability income  Risk to self with the past 6 months Suicidal Ideation: Yes-Currently Present(Pt reports "I'm always suicidal") Has patient been a risk to self within the past 6 months prior to admission? : Yes Suicidal Intent: No Has patient had any suicidal intent within the past 6 months prior to admission? : Yes Is patient at risk for suicide?: Yes Suicidal  Plan?: No Has patient had any suicidal plan within the past 6 months prior to admission? : (unknown) What has been your use of drugs/alcohol within the last 12 months?: current use: alcohol, cocaine, benzo Previous Attempts/Gestures: Yes How many times?: 4 Triggers for Past Attempts: Family contact, Other (Comment)(addiction) Intentional Self Injurious Behavior: None Family Suicide  History: No Recent stressful life event(s): Other (Comment)(lost housing, relapse) Persecutory voices/beliefs?: No Depression: Yes Depression Symptoms: Despondent, Insomnia, Isolating, Tearfulness, Loss of interest in usual pleasures, Feeling worthless/self pity Substance abuse history and/or treatment for substance abuse?: Yes Suicide prevention information given to non-admitted patients: Not applicable  Risk to Others within the past 6 months Homicidal Ideation: No Does patient have any lifetime risk of violence toward others beyond the six months prior to admission? : No Thoughts of Harm to Others: No Current Homicidal Intent: No Current Homicidal Plan: No Access to Homicidal Means: No History of harm to others?: No Assessment of Violence: In distant past Violent Behavior Description: fights when younger Does patient have access to weapons?: No Criminal Charges Pending?: No Does patient have a court date: No Is patient on probation?: No  Psychosis Hallucinations: None noted Delusions: None noted  Mental Status Report Appearance/Hygiene: In scrubs Eye Contact: Fair Motor Activity: Unremarkable Speech: Logical/coherent Level of Consciousness: Quiet/awake Mood: Pleasant, Depressed Affect: Appropriate to circumstance Anxiety Level: None Thought Processes: Coherent, Relevant Judgement: Unimpaired Orientation: Person, Place, Time, Situation Obsessive Compulsive Thoughts/Behaviors: None  Cognitive Functioning Concentration: Normal Memory: Recent Intact, Remote Intact Is patient IDD: No Insight: Good Impulse Control: Poor Appetite: Good Have you had any weight changes? : No Change Sleep: Decreased Total Hours of Sleep: 5 Vegetative Symptoms: None  ADLScreening [facility]) Patient's cognitive ability adequate to safely complete daily activities?: Yes Patient able to express need for assistance with ADLs?: Yes Independently performs ADLs?: Yes  (appropriate for developmental age)  Prior Inpatient Therapy Prior Inpatient Therapy: Yes Prior Therapy Dates: 2019 Prior Therapy Facilty/Provider(s): [facility], [facility], [facility] Reason for Treatment: psych and substance abuse  Prior Outpatient Therapy Prior Outpatient Therapy: Yes Prior Therapy Dates: current Prior Therapy Facilty/Provider(s): [facility] Reason for Treatment: meds/psych Does patient have an ACCT team?: No Does patient have Intensive In-House Services?  : No Does patient have [facility] services? : Yes Does patient have [facility] services?: No  ADL Screening (condition at time of admission) Patient's cognitive ability adequate to safely complete daily activities?: Yes Patient able to express need for assistance with ADLs?: Yes Independently performs ADLs?: Yes (appropriate for developmental age)       Abuse/Neglect Assessment (Assessment to be complete while patient is alone) Abuse/Neglect Assessment Can Be Completed: Yes Physical Abuse: Denies Verbal Abuse: Denies Sexual Abuse: Denies Exploitation of patient/patient's resources: Denies Self-Neglect: Denies     Merchant navy officerAdvance Directives (For Healthcare) Does Patient Have a Medical Advance Directive?: No Would patient like information on creating a medical advance directive?: No - Patient declined          Disposition:  Disposition Initial Assessment Completed for this Encounter: Yes  On Site Evaluation by:   Reviewed with Physician:    Lorri FrederickWierda, Armonie Staten Jon 11/10/2017 4:50 AM

## 2017-11-11 DIAGNOSIS — Z818 Family history of other mental and behavioral disorders: Secondary | ICD-10-CM

## 2017-11-11 DIAGNOSIS — Z813 Family history of other psychoactive substance abuse and dependence: Secondary | ICD-10-CM

## 2017-11-11 DIAGNOSIS — F419 Anxiety disorder, unspecified: Secondary | ICD-10-CM

## 2017-11-11 DIAGNOSIS — F1290 Cannabis use, unspecified, uncomplicated: Secondary | ICD-10-CM

## 2017-11-11 DIAGNOSIS — F1414 Cocaine abuse with cocaine-induced mood disorder: Secondary | ICD-10-CM

## 2017-11-11 DIAGNOSIS — F17210 Nicotine dependence, cigarettes, uncomplicated: Secondary | ICD-10-CM

## 2017-11-11 HISTORY — DX: Cocaine abuse with cocaine-induced mood disorder: F14.14

## 2017-11-11 NOTE — Patient Outreach (Signed)
ED Peer Support Specialist Patient Intake (Complete at intake & 30-60 Day Follow-up)  Name: Chris Olson  MRN: [id number]  Age: 27 y.o.   Date of Admission: 11/11/2017  Intake: Initial Comments:      Primary Reason Admitted: Pt reports he is depressed and "tired of going through everything."  Pt reports [facility] has [facility] in [facility] but that he had to leave his program housing yesterday, doesn't know why or what is going on, and has been staying in his car.  Pt reports he has been in [facility] for 5 months but that he has been using alcohol and cocaine 1-2x a week while in the program. Pt does not believe he is being discharge but cannot speak to anyone at [facility] until Monday.  Pt reports suicidal ideation, but then states "I always have suicidal thoughts."  Pt denies plan or intent but reports he does not feel safe.  Pt denies HI/AV.  Pt has had multiple prior admits to [facility] and to several substance use residential programs.   Lab values: Alcohol/ETOH: Positive Positive UDS? Yes Amphetamines: No Barbiturates: No Benzodiazepines: Yes Cocaine: Yes Opiates: No Cannabinoids: Yes  Demographic information: Gender: Male Ethnicity: African American Marital Status: Single Insurance Status: Medicaid Ecologist (Work Neurosurgeon, Physicist, medical, Social research officer, government.: Yes(SSI) Lives with: Alone Living situation: Homeless  Reported Patient History: Patient reported health conditions: Anxiety disorders, Schizoaffective disorder, Depression Patient aware of HIV and hepatitis status: No  In past year, has patient visited ED for any reason? No  Number of ED visits:    Reason(s) for visit:    In past year, has patient been hospitalized for any reason? No  Number of hospitalizations:    Reason(s) for hospitalization:    In past year, has patient been arrested? No  Number of arrests:    Reason(s) for arrest:    In past year, has patient  been incarcerated? No  Number of incarcerations:    Reason(s) for incarceration:    In past year, has patient received medication-assisted treatment? No  In past year, patient received the following treatments: ([facility] )  In past year, has patient received any harm reduction services? No  Did this include any of the following?    In past year, has patient received care from a mental health provider for diagnosis other than SUD? No  In past year, is this first time patient has overdosed? No  Number of past overdoses:    In past year, is this first time patient has been hospitalized for an overdose? No  Number of hospitalizations for overdose(s):    Is patient currently receiving treatment for a mental health diagnosis? Yes([facility] services)  Patient reports experiencing difficulty participating in SUD treatment: No    Most important reason(s) for this difficulty?    Has patient received prior services for treatment? Yes  In past, patient has received services from following agencies: [facility] ([facility])(Pt stated he wants any services that will help him stay focus in the community. )  Plan of Care:  Suggested follow up at these agencies/treatment centers: [facility] ([facility]), [facility] ([place])  Other information: [facility]PSS met with Pt and was able to gain information to better assist Pt. CPSS processed with Pt and help him understand what support CPSS can offer Pt. CPSS  Spoke with Pt about various services that Pt may benefit from . CPSS explained to Pt  that he can gain the services that he needs. CPSS made Pt aware that CPSS will contact [facility] services to find placement.    [name], [place]  11/11/2017 11:55 AM

## 2017-11-11 NOTE — Consult Note (Signed)
[facility] ED Discharge  11/11/2017 12:35 PM Chris Olson  MRN:  [id number] Principal Problem: Cocaine abuse with cocaine-induced mood disorder [facility]) Discharge Diagnoses:  Patient Active Problem List   Diagnosis Date Noted  . Cocaine abuse with cocaine-induced mood disorder [facility]) [F14.14] 11/11/2017    Priority: High    Subjective: 27 yo male who presented to the ED after using cocaine and having suicidal ideations.  He was living at [facility] but was evicted yesterday due to substance abuse.  Left there and came here.  Today, he denies suicidal/homicidal ideations, hallucinations, and withdrawal symptoms.  He has been using drugs since his teens and increasing with stressors, decreases with lack of funds.  Chris Olson has had many hospitalization and rehabs/recoveries with little vestment on his part.  Peer support consult placed, stable for discharge.  Total Time spent with patient: 45 minutes  Past Psychiatric History: substance abuse  Past Medical History:  Past Medical History:  Diagnosis Date  . Anxiety   . Chest pain   . Depression   . Pericarditis   . Schizophrenia (HCC)    History reviewed. No pertinent surgical history. Family History: No family history on file. Family Psychiatric  History: mother with depression, substance abuse within the family Social History:  Social History   Substance and Sexual Activity  Alcohol Use Yes   Comment: social use -- BAC was clear    Social History   Substance and Sexual Activity  Drug Use Yes  . Types: Marijuana, Cocaine   Comment: UDS was clear   Social History   Socioeconomic History  . Marital status: Legally Separated    Spouse name: Not on file  . Number of children: Not on file  . Years of education: Not on file  . Highest education level: Not on file  Occupational History  . Not on file  Social Needs  . Financial resource strain: Not on file  . Food insecurity:    Worry: Not on file    Inability: Not on  file  . Transportation needs:    Medical: Not on file    Non-medical: Not on file  Tobacco Use  . Smoking status: Current Every Day Smoker    Types: Cigarettes  . Smokeless tobacco: Never Used  Substance and Sexual Activity  . Alcohol use: Yes    Comment: social use -- BAC was clear  . Drug use: Yes    Types: Marijuana, Cocaine    Comment: UDS was clear  . Sexual activity: Not Currently  Lifestyle  . Physical activity:    Days per week: Not on file    Minutes per session: Not on file  . Stress: Not on file  Relationships  . Social connections:    Talks on phone: Not on file    Gets together: Not on file    Attends religious service: Not on file    Active member of club or organization: Not on file    Attends meetings of clubs or organizations: Not on file    Relationship status: Not on file  Other Topics Concern  . Not on file  Social History Narrative  . Not on file    Has this patient used any form of tobacco in the last 30 days? (Cigarettes, Smokeless Tobacco, Cigars, and/or Pipes) A prescription for an FDA-approved tobacco cessation medication was offered at discharge and the patient refused  Current Medications: Current Facility-Administered Medications  Medication Dose Route Frequency Provider Last Rate Last Dose  .  cariprazine (VRAYLAR) capsule 1.5 mg  1.5 mg Oral Daily Devoria AlbeKnapp, Iva, MD   1.5 mg at 11/11/17 0929  . hydrOXYzine (ATARAX/VISTARIL) tablet 25 mg  25 mg Oral Q6H PRN Devoria AlbeKnapp, Iva, MD   25 mg at 11/10/17 2133  . nicotine (NICODERM CQ - dosed in mg/24 hours) patch 21 mg  21 mg Transdermal Daily Devoria AlbeKnapp, Iva, MD   21 mg at 11/10/17 1718   Current Outpatient Medications  Medication Sig Dispense Refill  . cariprazine (VRAYLAR) capsule Take 1.5 mg by mouth daily.    . hydrOXYzine (ATARAX/VISTARIL) 25 MG tablet Take 1 tablet (25 mg total) by mouth every 6 (six) hours as needed for anxiety. 30 tablet 0  . nicotine polacrilex (NICORETTE) 2 MG gum Take 1 each (2 mg  total) by mouth as needed for smoking cessation. (Patient not taking: Reported on 11/10/2017) 100 tablet 0  . QUEtiapine (SEROQUEL) 100 MG tablet Take 1 tablet (100 mg total) by mouth at bedtime. (Patient not taking: Reported on 11/10/2017) 30 tablet 0  . sertraline (ZOLOFT) 50 MG tablet Take 1 tablet (50 mg total) by mouth daily. (Patient not taking: Reported on 11/10/2017) 30 tablet 0  . traZODone (DESYREL) 50 MG tablet Take 1 tablet (50 mg total) by mouth at bedtime as needed for sleep. (Patient not taking: Reported on 08/11/2017) 30 tablet 0   PTA Medications:  (Not in a hospital admission)  Musculoskeletal: Strength & Muscle Tone: within normal limits Gait & Station: normal Patient leans: N/A  Psychiatric Specialty Exam: Physical Exam  Nursing note and vitals reviewed. Constitutional: He is oriented to person, place, and time. He appears well-developed and well-nourished.  HENT:  Head: Normocephalic.  Neck: Normal range of motion.  Cardiovascular: Normal rate.  Respiratory: Effort normal.  Musculoskeletal: Normal range of motion.  Neurological: He is alert and oriented to person, place, and time.  Psychiatric: His speech is normal and behavior is normal. Judgment and thought content normal. His mood appears anxious. Cognition and memory are normal. He exhibits a depressed mood.    Review of Systems  Psychiatric/Behavioral: Positive for depression and substance abuse. The patient is nervous/anxious.   All other systems reviewed and are negative.   Blood pressure 110/64, pulse 73, temperature 98.9 F (37.2 C), temperature source Oral, resp. rate 16, height 5\' 8"  (1.727 m), weight 63.5 kg, SpO2 100 %.Body mass index is 21.29 kg/m.  General Appearance: Casual  Eye Contact:  Good  Speech:  Normal Rate  Volume:  Normal  Mood:  Anxious and Depressed, mild  Affect:  Congruent  Thought Process:  Coherent and Descriptions of Associations: Intact  Orientation:  Full (Time, Place, and  Person)  Thought Content:  WDL and Logical  Suicidal Thoughts:  No  Homicidal Thoughts:  No  Memory:  Immediate;   Good Recent;   Good Remote;   Good  Judgement:  Fair  Insight:  Fair  Psychomotor Activity:  Normal  Concentration:  Concentration: Good and Attention Span: Good  Recall:  Good  Fund of Knowledge:  Fair  Language:  Good  Akathisia:  No  Handed:  Right  AIMS (if indicated):     Assets:  Leisure Time Physical Health Resilience  ADL's:  Intact  Cognition:  WNL  Sleep:        Demographic Factors:  Male, Adolescent or young adult and Caucasian  Loss Factors: NA  Historical Factors: NA  Risk Reduction Factors:   Sense of responsibility to family and Positive social support  Continued Clinical Symptoms:  Depression and anxiety, mild  Cognitive Features That Contribute To Risk:  None    Suicide Risk:  Minimal: No identifiable suicidal ideation.  Patients presenting with no risk factors but with morbid ruminations; may be classified as minimal risk based on the severity of the depressive symptoms    Plan Of Care/Follow-up recommendations:  Activity:  as tolerated Diet:  heart healthy diet  Disposition: discharge home Nanine Means, NP 11/11/2017, 12:35 PM

## 2017-11-11 NOTE — ED Notes (Signed)
Pt continues to be depressed. Denies SI/HI. He states, "I had a bad dream, I want long term. I'm feel so depressed."

## 2017-11-11 NOTE — ED Notes (Signed)
Discharge instruction given to patient. Pt has all belongings and he has signed all documents. He plans to go on [address] for community support (resources).

## 2018-02-11 ENCOUNTER — Emergency Department (HOSPITAL_COMMUNITY)
Admission: EM | Admit: 2018-02-11 | Discharge: 2018-02-11 | Disposition: A | Discharge status: Home / Self Care | Payer: Medicaid Other | Attending: Emergency Medicine | Admitting: Emergency Medicine

## 2018-02-11 ENCOUNTER — Encounter (HOSPITAL_COMMUNITY): Payer: Self-pay

## 2018-02-11 DIAGNOSIS — M545 Low back pain, unspecified: Secondary | ICD-10-CM

## 2018-02-11 DIAGNOSIS — F17210 Nicotine dependence, cigarettes, uncomplicated: Secondary | ICD-10-CM | POA: Diagnosis not present

## 2018-02-11 DIAGNOSIS — F329 Major depressive disorder, single episode, unspecified: Secondary | ICD-10-CM | POA: Insufficient documentation

## 2018-02-11 DIAGNOSIS — F419 Anxiety disorder, unspecified: Secondary | ICD-10-CM | POA: Insufficient documentation

## 2018-02-11 DIAGNOSIS — F209 Schizophrenia, unspecified: Secondary | ICD-10-CM | POA: Diagnosis not present

## 2018-02-11 DIAGNOSIS — Z79899 Other long term (current) drug therapy: Secondary | ICD-10-CM | POA: Diagnosis not present

## 2018-02-11 DIAGNOSIS — F1410 Cocaine abuse, uncomplicated: Secondary | ICD-10-CM | POA: Insufficient documentation

## 2018-02-11 LAB — URINALYSIS, ROUTINE W REFLEX MICROSCOPIC
Bilirubin Urine: NEGATIVE
GLUCOSE, UA: NEGATIVE mg/dL
Hgb urine dipstick: NEGATIVE
Ketones, ur: NEGATIVE mg/dL
LEUKOCYTES UA: NEGATIVE
Nitrite: NEGATIVE
PH: 6 (ref 5.0–8.0)
PROTEIN: NEGATIVE mg/dL
Specific Gravity, Urine: 1.016 (ref 1.005–1.030)

## 2018-02-11 NOTE — Discharge Instructions (Addendum)
You were seen in the emergency department for back pain today.  At this time we suspect that your pain is related to a muscle strain/spasm.   Your urine was normal  Please take tylenol/motrin per over the counter dosing to help with this.    The application of heat can help soothe the pain.  Maintaining your daily activities, including walking, is encourged, as it will help you get better faster than just staying in bed.  Your pain should get better over the next 2 weeks.  You will need to follow up with  Your primary healthcare provider in 1-2 weeks for reassessment, if you do not have a primary care provider one is provided in your discharge instructions- you may see the [facility] or call the provided phone number. However return to the ER should you develop ne or worsening symptoms or any other concerns including but not limited to severe or worsening pain, low back pain with fever, numbness, weakness, loss of bowel or bladder control, or inability to walk or urinate, you should return to the ER immediately.

## 2018-02-11 NOTE — ED Notes (Signed)
Patient verbalizes understanding of discharge instructions. Opportunity for questioning and answers were provided. Armband removed by staff, pt discharged from ED ambulatory.   

## 2018-02-11 NOTE — ED Triage Notes (Signed)
Pt c/o bilateral flank pain intermittently x2 weeks denies and urinary symptoms.

## 2018-02-11 NOTE — ED Provider Notes (Signed)
[facility] Provider Note   CSN: [id number] Arrival date & time: 02/11/18  1128     History   Chief Complaint Chief Complaint  Patient presents with  . Flank Pain    HPI Chris Moss Mcnstead Jr. is a 27 y.o. male with hx of tobacco abuse, anxiety, cocaine abuse, and schizophrenia presents to the ED with complaints of back pain x 2-3 weeks. Patient states pain is in the bilateral lower back and seems to be worse just prior to urination and with coughing. Current pain is a 1/10 in severity. It seems to be alleviated after urinating. Has not tried intervention PTA. He states he has been urinating more frequently, but no dysuria/urgency/hematuria.  He is concerned about his kidneys.  Denies penile discharge, testicular pain, testicular swelling, abdominal pain, or pain with bowel movements. Currently sexually active w/ 2 male partners, not overly concerned for STDs.  Denies recent heavy lifting or injury. .Denies numbness, tingling, weakness, saddle anesthesia, incontinence to bowel/bladder, fever, chills, IV drug use, dysuria, or hx of cancer. Patient has not had prior back surgeries.  HPI  Past Medical History:  Diagnosis Date  . Anxiety   . Chest pain   . Depression   . Pericarditis   . Schizophrenia [facility](HCC)     Patient Active Problem List   Diagnosis Date Noted  . Cocaine abuse with cocaine-induced mood disorder (HCC) 11/11/2017    History reviewed. No pertinent surgical history.      Home Medications    Prior to Admission medications   Medication Sig Start Date End Date Taking? Authorizing Provider  cariprazine (VRAYLAR) capsule Take 1.5 mg by mouth daily.    [provider]  hydrOXYzine (ATARAX/VISTARIL) 25 MG tablet Take 1 tablet (25 mg total) by mouth every 6 (six) hours as needed for anxiety. 07/15/17   Oneta RackLewis, Tanika N, NP    Family History History reviewed. No pertinent family history.  Social History Social History     Tobacco Use  . Smoking status: Current Every Day Smoker    Types: Cigarettes  . Smokeless tobacco: Never Used  Substance Use Topics  . Alcohol use: Yes    Comment: social use -- BAC was clear  . Drug use: Yes    Types: Marijuana, Cocaine    Comment: UDS was clear     Allergies   Patient has no known allergies.   Review of Systems Review of Systems  Constitutional: Negative for chills and fever.  Gastrointestinal: Negative for abdominal pain, diarrhea, nausea and vomiting.  Genitourinary: Positive for frequency. Negative for dysuria, flank pain, genital sores, hematuria, penile pain, penile swelling, scrotal swelling, testicular pain and urgency.  Musculoskeletal: Positive for back pain.  Neurological: Negative for weakness and numbness.       Negative for incontinence/saddle anesthesia.    Physical Exam Updated Vital Signs BP 119/72 (BP Location: Right Arm)   Pulse 78   Temp 97.9 F (36.6 C) (Oral)   Resp 16   SpO2 98%   Physical Exam Constitutional:      General: He is not in acute distress.    Appearance: He is well-developed. He is not toxic-appearing.  HENT:     Head: Normocephalic and atraumatic.  Neck:     Musculoskeletal: Normal range of motion and neck supple. No spinous process tenderness or muscular tenderness.  Cardiovascular:     Rate and Rhythm: Normal rate and regular rhythm.  Pulmonary:     Effort: Pulmonary effort is  normal.  Abdominal:     General: There is no distension.     Palpations: Abdomen is soft.     Tenderness: There is no right CVA tenderness, left CVA tenderness, guarding or rebound.  Musculoskeletal:     Comments: No obvious deformity, appreciable swelling, erythema, ecchymosis, significant open wounds, or increased warmth.  Extremities: Normal ROM. Nontender.  Back: No point/focal vertebral tenderness, no palpable step off or crepitus. Some tenderness to the bilateral paraspinal region.   Skin:    General: Skin is warm and dry.      Findings: No rash.  Neurological:     Mental Status: He is alert.     Deep Tendon Reflexes:     Reflex Scores:      Patellar reflexes are 2+ on the right side and 2+ on the left side.    Comments: Sensation grossly intact to bilateral lower extremities. 5/5 symmetric strength with plantar/dorsiflexion bilaterally. Gait is intact without obvious foot drop.     ED Treatments / Results  Labs (all labs ordered are listed, but only abnormal results are displayed) Labs Reviewed  URINALYSIS, ROUTINE W REFLEX MICROSCOPIC  GC/CHLAMYDIA PROBE AMP ([place]) NOT AT [facility]    EKG None  Radiology No results found.  Procedures Procedures (including critical care time)  Medications Ordered in ED Medications - No data to display   Initial Impression / Assessment and Plan / ED Course  I have reviewed the triage vital signs and the nursing notes.  Pertinent labs & imaging results that were available during my care of the patient were reviewed by me and considered in my medical decision making (see chart for details).    Patient presents with complaint of back pain.  Patient is nontoxic appearing, vitals are WNL. Patient has normal neurologic exam, no point/focal midline tenderness to palpation. He is ambulatory in the ED.  No back pain red flags. He does report that pain seems to be more present prior to urinating and is relieved by this, states he is worried about his kidneys- urinalysis obtained and is completely normal- no UTI, no hematuria/proteinuria to raise concern for renal dysfunction, no glucosuria to suggest DM. GC/chlamydia added, but low suspicion as etiology based on presentation. Possibly musculoskeletal. Considered disc disease, UTI/pyelonephritis, kidney stone, aortic aneurysm/dissection, cauda equina or epidural abscess however these do not fit clinical picture at this time. Recommended tylenol/motrin & primary care follow up. I discussed treatment plan, need for PCP  follow-up, and return precautions with the patient. Provided opportunity for questions, patient confirmed understanding and is in agreement with plan.   Final Clinical Impressions(s) / ED Diagnoses   Final diagnoses:  Bilateral low back pain without sciatica, unspecified chronicity    ED Discharge Orders    None       Cherly Anderson, PA-C 02/11/18 1327    Gerhard Munch, MD 02/11/18 [phone]

## 2018-02-12 LAB — GC/CHLAMYDIA PROBE AMP (~~LOC~~) NOT AT ARMC
Chlamydia: NEGATIVE
NEISSERIA GONORRHEA: NEGATIVE
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
## 2018-05-09 IMAGING — DX DG CHEST 2V
2 series · 2 of 2 positions shown · non-contrast
Comparison: December 08, 2011

CLINICAL DATA: Left-sided chest pain with shortness of breath

EXAM:
CHEST  2 VIEW

[chest pa]
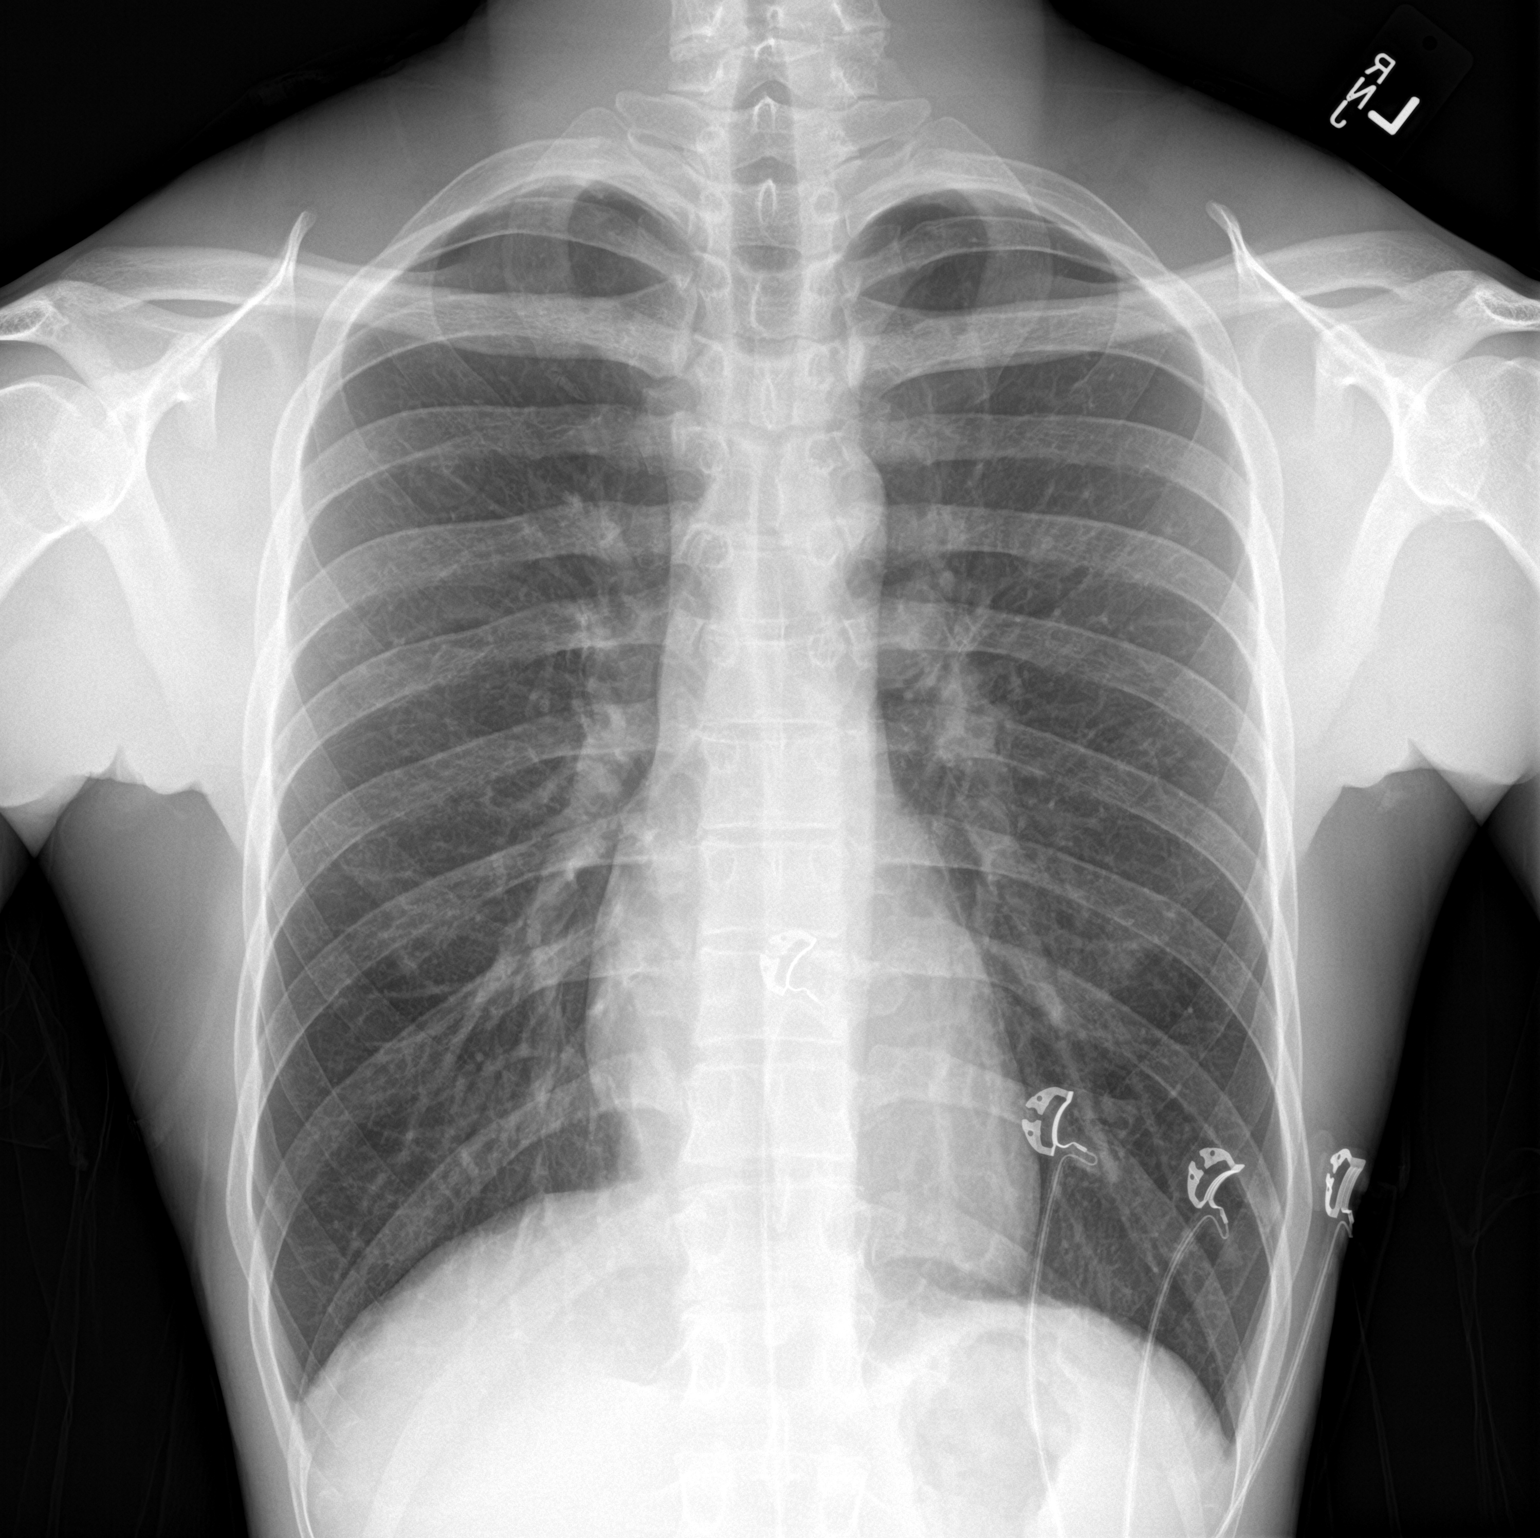

[chest lat]
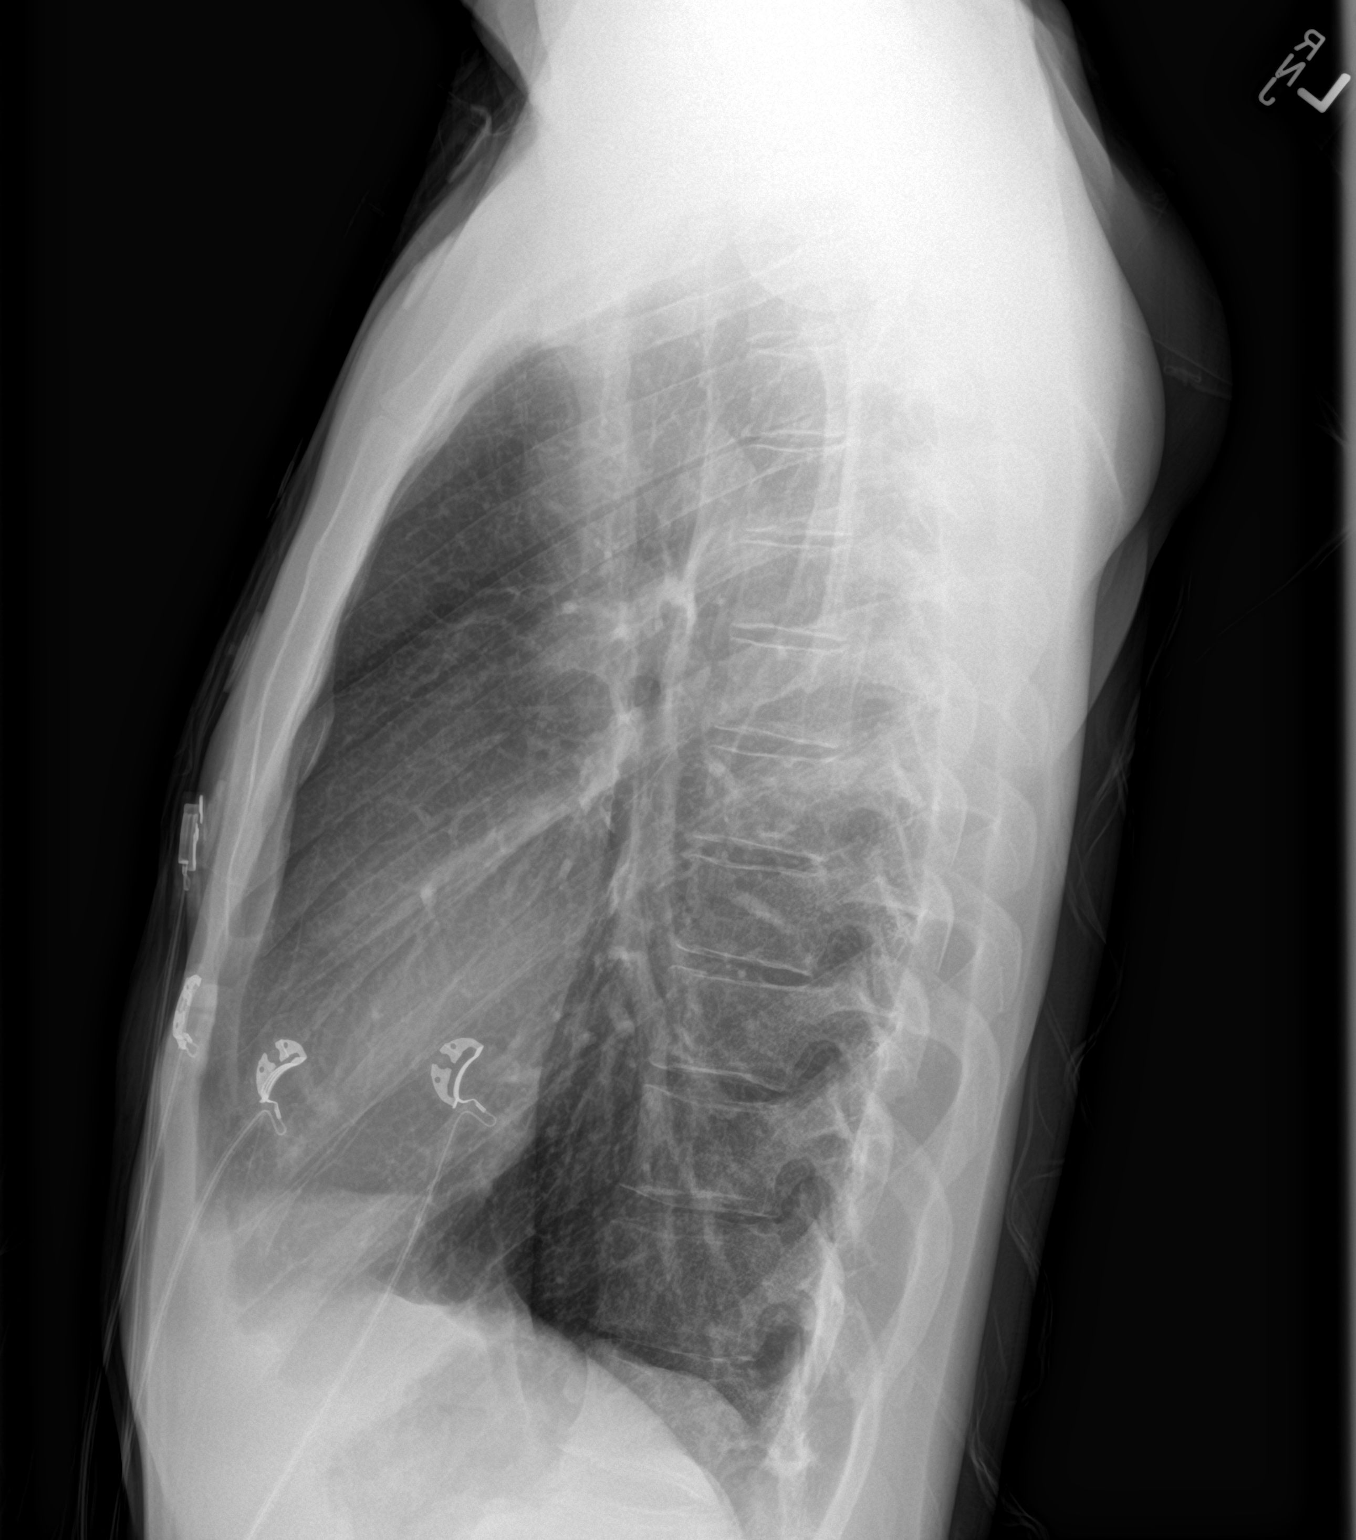

[2 of 2 positions shown; findings below may reference images not displayed]

FINDINGS: Lungs are clear. Heart size and pulmonary vascularity are normal. No
adenopathy. No pneumothorax. No bone lesions.
IMPRESSION: No edema or consolidation.

## 2019-07-15 ENCOUNTER — Other Ambulatory Visit (HOSPITAL_COMMUNITY): Payer: Self-pay | Admitting: Internal Medicine

## 2019-07-15 DIAGNOSIS — R079 Chest pain, unspecified: Secondary | ICD-10-CM

## 2019-07-20 ENCOUNTER — Ambulatory Visit (HOSPITAL_COMMUNITY): Admission: RE | Admit: 2019-07-20 | Payer: Medicaid Other | Source: Ambulatory Visit

## 2019-07-24 ENCOUNTER — Ambulatory Visit (HOSPITAL_COMMUNITY)
Admission: RE | Admit: 2019-07-24 | Discharge: 2019-07-24 | Disposition: A | Discharge status: Home / Self Care | Payer: Medicaid Other | Source: Ambulatory Visit | Attending: Internal Medicine | Admitting: Internal Medicine

## 2019-07-24 ENCOUNTER — Other Ambulatory Visit: Payer: Self-pay

## 2019-07-24 DIAGNOSIS — F17200 Nicotine dependence, unspecified, uncomplicated: Secondary | ICD-10-CM | POA: Diagnosis not present

## 2019-07-24 DIAGNOSIS — R079 Chest pain, unspecified: Secondary | ICD-10-CM | POA: Diagnosis present

## 2019-07-24 NOTE — Progress Notes (Signed)
  Echocardiogram 2D Echocardiogram has been performed.  Leta Jungling M 07/24/2019, 2:43 PM

## 2019-08-22 ENCOUNTER — Other Ambulatory Visit: Payer: Self-pay

## 2019-08-22 ENCOUNTER — Emergency Department (HOSPITAL_COMMUNITY)
Admission: EM | Admit: 2019-08-22 | Discharge: 2019-08-23 | Disposition: A | Discharge status: Other Acute Inpatient Hospital | Payer: Medicaid Other | Attending: Emergency Medicine | Admitting: Emergency Medicine

## 2019-08-22 DIAGNOSIS — R45851 Suicidal ideations: Secondary | ICD-10-CM | POA: Insufficient documentation

## 2019-08-22 DIAGNOSIS — F17210 Nicotine dependence, cigarettes, uncomplicated: Secondary | ICD-10-CM | POA: Insufficient documentation

## 2019-08-22 DIAGNOSIS — Z20822 Contact with and (suspected) exposure to covid-19: Secondary | ICD-10-CM | POA: Insufficient documentation

## 2019-08-22 DIAGNOSIS — F1020 Alcohol dependence, uncomplicated: Secondary | ICD-10-CM | POA: Insufficient documentation

## 2019-08-22 DIAGNOSIS — F332 Major depressive disorder, recurrent severe without psychotic features: Secondary | ICD-10-CM | POA: Insufficient documentation

## 2019-08-22 LAB — CBC
HCT: 47.9 % (ref 39.0–52.0)
Hemoglobin: 16.4 g/dL (ref 13.0–17.0)
MCH: 34 pg (ref 26.0–34.0)
MCHC: 34.2 g/dL (ref 30.0–36.0)
MCV: 99.4 fL (ref 80.0–100.0)
Platelets: 191 10*3/uL (ref 150–400)
RBC: 4.82 MIL/uL (ref 4.22–5.81)
RDW: 11.9 % (ref 11.5–15.5)
WBC: 10.6 10*3/uL — ABNORMAL HIGH (ref 4.0–10.5)
nRBC: 0 % (ref 0.0–0.2)

## 2019-08-22 LAB — COMPREHENSIVE METABOLIC PANEL
ALT: 36 U/L (ref 0–44)
AST: 35 U/L (ref 15–41)
Albumin: 4.9 g/dL (ref 3.5–5.0)
Alkaline Phosphatase: 59 U/L (ref 38–126)
Anion gap: 13 (ref 5–15)
BUN: 11 mg/dL (ref 6–20)
CO2: 25 mmol/L (ref 22–32)
Calcium: 9.3 mg/dL (ref 8.9–10.3)
Chloride: 104 mmol/L (ref 98–111)
Creatinine, Ser: 0.91 mg/dL (ref 0.61–1.24)
GFR calc Af Amer: >60 mL/min (ref >60)
GFR calc non Af Amer: >60 mL/min (ref >60)
Glucose, Bld: 90 mg/dL (ref 70–99)
Potassium: 3.6 mmol/L (ref 3.5–5.1)
Sodium: 142 mmol/L (ref 135–145)
Total Bilirubin: 0.7 mg/dL (ref 0.3–1.2)
Total Protein: 7.6 g/dL (ref 6.5–8.1)

## 2019-08-22 LAB — RAPID URINE DRUG SCREEN, HOSP PERFORMED
Amphetamines: NOT DETECTED
Barbiturates: NOT DETECTED
Benzodiazepines: NOT DETECTED
Cocaine: NOT DETECTED
Opiates: NOT DETECTED
Tetrahydrocannabinol: NOT DETECTED

## 2019-08-22 LAB — ETHANOL: Alcohol, Ethyl (B): <10 mg/dL (ref <10)

## 2019-08-22 LAB — SALICYLATE LEVEL: Salicylate Lvl: <7 mg/dL — ABNORMAL LOW (ref 7.0–30.0)

## 2019-08-22 LAB — ACETAMINOPHEN LEVEL: Acetaminophen (Tylenol), Serum: <10 ug/mL — ABNORMAL LOW (ref 10–30)

## 2019-08-22 MED ORDER — HYDROXYZINE HCL 25 MG PO TABS
25.0000 mg | ORAL_TABLET | Freq: Four times a day (QID) | ORAL | Status: DC | PRN
  Administered 2019-08-23: 25 mg via ORAL
  Filled 2019-08-22: qty 1

## 2019-08-22 MED ORDER — NICOTINE 21 MG/24HR TD PT24: 21.0000 mg | MEDICATED_PATCH | Freq: Every day | TRANSDERMAL | Status: DC

## 2019-08-22 MED ORDER — ONDANSETRON HCL 4 MG PO TABS: 4.0000 mg | ORAL_TABLET | Freq: Three times a day (TID) | ORAL | Status: DC | PRN

## 2019-08-22 MED ORDER — CARIPRAZINE HCL 1.5 MG PO CAPS
1.5000 mg | ORAL_CAPSULE | Freq: Every day | ORAL | Status: DC
  Filled 2019-08-22: qty 1

## 2019-08-22 MED ORDER — ALUM & MAG HYDROXIDE-SIMETH 200-200-20 MG/5ML PO SUSP: 30.0000 mL | Freq: Four times a day (QID) | ORAL | Status: DC | PRN

## 2019-08-22 MED ORDER — ACETAMINOPHEN 325 MG PO TABS: 650.0000 mg | ORAL_TABLET | ORAL | Status: DC | PRN

## 2019-08-22 NOTE — ED Notes (Signed)
Per Ala Dach, recommend inpatient treatment  [facility] full, looking for placement, pt will remain at [facility] until placement found RN Karleen Hampshire made aware

## 2019-08-22 NOTE — ED Triage Notes (Signed)
Patient bib gems for SI. Patient reports he has been feeling suicidal for days. States no one cares about him and every one has given up on him. Girlfriend recently left him. Patient endorses SI with plan of walking into traffic. Patient has psych history and states he currently takes his medication. [employer] states patient was found crying in his yard, patient told [employer] that he [facility] but that [facility] has not checked his status.

## 2019-08-22 NOTE — BH Assessment (Signed)
Chris Olson, [facility], said room 305-1 is now available and Pt has been accepted to the service of Dr Jola Babinski pending resulted COVID test. Notified Dr. Donnald Garre of acceptance.   Pamalee Leyden, [facility], [facility] Triage Specialist [phone]
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
## 2019-08-22 NOTE — ED Provider Notes (Signed)
[facility]-EMERGENCY DEPT Provider Note   CSN: [id number] Arrival date & time: 08/22/19  1428     History Chief Complaint  Patient presents with   Suicidal    Chris Olson. is a 29 y.o. male.  HPI Patient reports he is getting increasingly depressed.  He reports he feels like he cannot do anything right and he always messes up.  He reports he cries frequently.  He has had thoughts of killing himself by hanging or walking into traffic.  He is afraid that he might follow through on a suicide attempt.  Stressors include the death of his mother 6 months ago, his girlfriend recently living him, feeling like his father does not care about him.  Patient reports that he drinks 2 to 4 40 ounce beers when he has money.  He denies ever experiencing any withdrawal symptoms.  He does not get tremor or seizure if he does not drink alcohol.  He smokes about 1/2 pack of cigarettes per day.  He reports occasional cocaine use.  Last use about 3 weeks ago.  He reports he does better when he takes his medications, he reports sometimes he forgets and then has problems with anger control which negatively impacts his relationship with his girlfriend.    Past Medical History:  Diagnosis Date   Anxiety    Chest pain    Depression    Pericarditis    Schizophrenia [facility])     Patient Active Problem List   Diagnosis Date Noted   Cocaine abuse with cocaine-induced mood disorder (HCC) 11/11/2017    No past surgical history on file.     No family history on file.  Social History   Tobacco Use   Smoking status: Current Every Day Smoker    Types: Cigarettes   Smokeless tobacco: Never Used  Substance Use Topics   Alcohol use: Yes    Comment: social use -- BAC was clear   Drug use: Yes    Types: Marijuana, Cocaine    Comment: UDS was clear    Home Medications Prior to Admission medications   Medication Sig Start Date End Date Taking? Authorizing Provider    cariprazine (VRAYLAR) capsule Take 1.5 mg by mouth daily.    [provider]  hydrOXYzine (ATARAX/VISTARIL) 25 MG tablet Take 1 tablet (25 mg total) by mouth every 6 (six) hours as needed for anxiety. 07/15/17   Oneta Rack, NP    Allergies    Patient has no known allergies.  Review of Systems   Review of Systems 10 systems reviewed and negative except as per HPI. Physical Exam Updated Vital Signs BP (!) 148/99 (BP Location: Left Arm)    Pulse 98    Temp 98.6 F (37 C) (Oral)    Resp 17    Ht 5\' 8"  (1.727 m)    Wt 64.9 kg    SpO2 100%    BMI 21.74 kg/m   Physical Exam Constitutional:      Appearance: He is well-developed.  HENT:     Head: Normocephalic and atraumatic.  Eyes:     Extraocular Movements: Extraocular movements intact.     Conjunctiva/sclera: Conjunctivae normal.  Cardiovascular:     Rate and Rhythm: Normal rate and regular rhythm.     Heart sounds: Normal heart sounds.  Pulmonary:     Effort: Pulmonary effort is normal.     Breath sounds: Normal breath sounds.  Abdominal:     General: Bowel sounds are  normal. There is no distension.     Palpations: Abdomen is soft.     Tenderness: There is no abdominal tenderness.  Musculoskeletal:        General: Normal range of motion.     Cervical back: Neck supple.  Skin:    General: Skin is warm and dry.  Neurological:     General: No focal deficit present.     Mental Status: He is alert and oriented to person, place, and time.     GCS: GCS eye subscore is 4. GCS verbal subscore is 5. GCS motor subscore is 6.     Coordination: Coordination normal.  Psychiatric:     Comments: Patient is appropriately interactive and forthcoming with history.  Poor eye contact.  Situationally appropriate and not responding to internal stimuli.     ED Results / Procedures / Treatments   Labs (all labs ordered are listed, but only abnormal results are displayed) Labs Reviewed  CBC - Abnormal; Notable for the following  components:      Result Value   WBC 10.6 (*)    All other components within normal limits  SARS CORONAVIRUS 2 BY RT PCR (HOSPITAL ORDER, PERFORMED IN [place] [facility])  COMPREHENSIVE METABOLIC PANEL  ETHANOL  SALICYLATE LEVEL  ACETAMINOPHEN LEVEL  RAPID URINE DRUG SCREEN, HOSP PERFORMED    EKG None  Radiology No results found.  Procedures Procedures (including critical care time)  Medications Ordered in ED Medications  cariprazine (VRAYLAR) capsule 1.5 mg (has no administration in time range)  hydrOXYzine (ATARAX/VISTARIL) tablet 25 mg (has no administration in time range)  acetaminophen (TYLENOL) tablet 650 mg (has no administration in time range)  ondansetron (ZOFRAN) tablet 4 mg (has no administration in time range)  alum & mag hydroxide-simeth (MAALOX/MYLANTA) 200-200-20 MG/5ML suspension 30 mL (has no administration in time range)  nicotine (NICODERM CQ - dosed in mg/24 hours) patch 21 mg (has no administration in time range)    ED Course  I have reviewed the triage vital signs and the nursing notes.  Pertinent labs & imaging results that were available during my care of the patient were reviewed by me and considered in my medical decision making (see chart for details).    MDM Rules/Calculators/A&P                          Patient presents as outlined above.  He is otherwise healthy adult.  Review of systems negative.  Patient has not had Covid vaccination.  No positive symptoms on review.  Patient is alert and situationally oriented.  Is not exhibiting signs of psychosis or hallucination.  Patient describes severe depression with frequent crying and thoughts of suicide by walking into traffic or hanging.  At this time, with multiple recent stressors including a break-up in the relationship with his girlfriend and the death of his mother within 43 months, I do feel patient has high risk of self injury and at this time IVC paperwork is done by myself.  Patient does  not appear to have any active medical problems and is medically cleared for psychiatric evaluation.  Chris Moss Mc. was evaluated in Emergency Department on 08/22/2019 for the symptoms described in the history of present illness. He was evaluated in the context of the global COVID-19 pandemic, which necessitated consideration that the patient might be at risk for infection with the SARS-CoV-2 virus that causes COVID-19. Institutional protocols and algorithms that pertain to the evaluation  of patients at risk for COVID-19 are in a state of rapid change based on information released by regulatory bodies including the CDC and federal and state organizations. These policies and algorithms were followed during the patient's care in the ED. Final Clinical Impression(s) / ED Diagnoses Final diagnoses:  Suicidal ideation  Severe episode of recurrent major depressive disorder, without psychotic features [facility])    Rx / DC Orders ED Discharge Orders    None       Charlesetta Shanks, MD 08/22/19 1552

## 2019-08-23 ENCOUNTER — Other Ambulatory Visit: Payer: Self-pay

## 2019-08-23 ENCOUNTER — Encounter (HOSPITAL_COMMUNITY): Payer: Self-pay | Admitting: Nurse Practitioner

## 2019-08-23 ENCOUNTER — Inpatient Hospital Stay (HOSPITAL_COMMUNITY)
Admit: 2019-08-23 | Discharge: 2019-08-29 | DRG: 885 | Disposition: A | Discharge status: Home / Self Care | Payer: Medicaid Other | Source: Intra-hospital | Attending: Psychiatry | Admitting: Psychiatry

## 2019-08-23 DIAGNOSIS — F419 Anxiety disorder, unspecified: Secondary | ICD-10-CM | POA: Diagnosis present

## 2019-08-23 DIAGNOSIS — Z20822 Contact with and (suspected) exposure to covid-19: Secondary | ICD-10-CM | POA: Diagnosis present

## 2019-08-23 DIAGNOSIS — F17210 Nicotine dependence, cigarettes, uncomplicated: Secondary | ICD-10-CM | POA: Diagnosis present

## 2019-08-23 DIAGNOSIS — F10230 Alcohol dependence with withdrawal, uncomplicated: Secondary | ICD-10-CM | POA: Diagnosis present

## 2019-08-23 DIAGNOSIS — R45851 Suicidal ideations: Secondary | ICD-10-CM | POA: Diagnosis present

## 2019-08-23 DIAGNOSIS — F332 Major depressive disorder, recurrent severe without psychotic features: Secondary | ICD-10-CM | POA: Diagnosis present

## 2019-08-23 DIAGNOSIS — G4700 Insomnia, unspecified: Secondary | ICD-10-CM | POA: Diagnosis present

## 2019-08-23 LAB — SARS CORONAVIRUS 2 BY RT PCR (HOSPITAL ORDER, PERFORMED IN ~~LOC~~ HOSPITAL LAB): SARS Coronavirus 2: NEGATIVE

## 2019-08-23 MED ORDER — LORAZEPAM 1 MG PO TABS
1.0000 mg | ORAL_TABLET | Freq: Four times a day (QID) | ORAL | Status: DC | PRN
  Filled 2019-08-23: qty 1

## 2019-08-23 MED ORDER — LAMOTRIGINE 25 MG PO TABS
50.0000 mg | ORAL_TABLET | Freq: Every day | ORAL | Status: DC
  Administered 2019-08-23 – 2019-08-29 (×7): 50 mg via ORAL
  Filled 2019-08-23 (×8): qty 2

## 2019-08-23 MED ORDER — THIAMINE HCL 100 MG PO TABS
100.0000 mg | ORAL_TABLET | Freq: Every day | ORAL | Status: DC
  Administered 2019-08-23 – 2019-08-29 (×7): 100 mg via ORAL
  Filled 2019-08-23 (×8): qty 1

## 2019-08-23 MED ORDER — HYDROXYZINE HCL 25 MG PO TABS
25.0000 mg | ORAL_TABLET | Freq: Four times a day (QID) | ORAL | Status: DC | PRN
  Administered 2019-08-23 – 2019-08-27 (×9): 25 mg via ORAL
  Filled 2019-08-23 (×9): qty 1

## 2019-08-23 MED ORDER — ONDANSETRON HCL 4 MG PO TABS: 4.0000 mg | ORAL_TABLET | Freq: Three times a day (TID) | ORAL | Status: DC | PRN

## 2019-08-23 MED ORDER — CARIPRAZINE HCL 3 MG PO CAPS
3.0000 mg | ORAL_CAPSULE | Freq: Every day | ORAL | Status: DC
  Administered 2019-08-23 – 2019-08-25 (×3): 3 mg via ORAL
  Filled 2019-08-23 (×5): qty 1

## 2019-08-23 MED ORDER — NICOTINE 21 MG/24HR TD PT24: 21.0000 mg | MEDICATED_PATCH | Freq: Every day | TRANSDERMAL | Status: DC

## 2019-08-23 MED ORDER — MAGNESIUM HYDROXIDE 400 MG/5ML PO SUSP: 30.0000 mL | Freq: Every day | ORAL | Status: DC | PRN

## 2019-08-23 MED ORDER — FOLIC ACID 1 MG PO TABS
1.0000 mg | ORAL_TABLET | Freq: Every day | ORAL | Status: DC
  Administered 2019-08-23 – 2019-08-29 (×7): 1 mg via ORAL
  Filled 2019-08-23 (×9): qty 1

## 2019-08-23 MED ORDER — ALUM & MAG HYDROXIDE-SIMETH 200-200-20 MG/5ML PO SUSP: 30.0000 mL | ORAL | Status: DC | PRN

## 2019-08-23 MED ORDER — QUETIAPINE FUMARATE 50 MG PO TABS
50.0000 mg | ORAL_TABLET | Freq: Every day | ORAL | Status: DC
  Administered 2019-08-23 – 2019-08-24 (×2): 50 mg via ORAL
  Filled 2019-08-23 (×3): qty 1

## 2019-08-23 MED ORDER — ACETAMINOPHEN 325 MG PO TABS: 650.0000 mg | ORAL_TABLET | Freq: Four times a day (QID) | ORAL | Status: DC | PRN

## 2019-08-23 MED ORDER — NICOTINE POLACRILEX 2 MG MT GUM
2.0000 mg | CHEWING_GUM | OROMUCOSAL | Status: DC | PRN
  Administered 2019-08-23 – 2019-08-29 (×17): 2 mg via ORAL
  Filled 2019-08-23 (×6): qty 1

## 2019-08-23 MED ORDER — PAROXETINE HCL 10 MG PO TABS
10.0000 mg | ORAL_TABLET | Freq: Every day | ORAL | Status: DC
  Administered 2019-08-23 – 2019-08-29 (×7): 10 mg via ORAL
  Filled 2019-08-23 (×9): qty 1

## 2019-08-23 NOTE — Progress Notes (Signed)
   08/23/19 1761  COVID-19 Daily Checkoff  Have you had a fever (temp > 37.80C/100F)  in the past 24 hours?  No  If you have had runny nose, nasal congestion, sneezing in the past 24 hours, has it worsened? No  COVID-19 EXPOSURE  Have you traveled outside the state in the past 14 days? No  Have you been in contact with someone with a confirmed diagnosis of COVID-19 or PUI in the past 14 days without wearing appropriate PPE? No  Have you been living in the same home as a person with confirmed diagnosis of COVID-19 or a PUI (household contact)? No  Have you been diagnosed with COVID-19? No

## 2019-08-23 NOTE — Progress Notes (Signed)

## 2019-08-23 NOTE — Progress Notes (Signed)
Admission note  Pt is a 29 year old AAM admitted to the services of Dr. Jola Babinski for depression, suicidal ideation after conflict with girlfriend.  Pt states he lives in a bad boarding house that is not safe and feels that no one cares about him.  Pt states that his goal is to get his stress under control and to get moved somewhere safe.  Pt cooperative with the admission process.  Denies any medical issues at present.

## 2019-08-23 NOTE — Progress Notes (Signed)
   08/23/19 2154  Psych Admission Type (Psych Patients Only)  Admission Status Involuntary  Psychosocial Assessment  Patient Complaints Depression  Eye Contact Fair  Facial Expression Flat  Affect Appropriate to circumstance  Speech Logical/coherent  Interaction Assertive  Motor Activity Pacing  Appearance/Hygiene Unremarkable  Behavior Characteristics Appropriate to situation  Mood Depressed;Anxious  Thought Process  Coherency WDL  Content WDL  Delusions None reported or observed  Perception WDL  Hallucination Auditory  Judgment Impaired  Confusion Moderate  Danger to Self  Current suicidal ideation? Denies  Danger to Others  Danger to Others None reported or observed
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
## 2019-08-23 NOTE — Tx Team (Signed)
Initial Treatment Plan 08/23/2019 6:57 AM Chris Olson. BEE:[id number]    PATIENT STRESSORS: Financial difficulties Marital or family conflict Substance abuse   PATIENT STRENGTHS: Active sense of humor Average or above average intelligence Communication skills Motivation for treatment/growth Supportive family/friends   PATIENT IDENTIFIED PROBLEMS: Depression  Suicidal Ideation          "handle stress better, get mind right"         DISCHARGE CRITERIA:  Adequate post-discharge living arrangements Improved stabilization in mood, thinking, and/or behavior Motivation to continue treatment in a less acute level of care Need for constant or close observation no longer present Verbal commitment to aftercare and medication compliance  PRELIMINARY DISCHARGE PLAN: Outpatient therapy Placement in alternative living arrangements  PATIENT/FAMILY INVOLVEMENT: This treatment plan has been presented to and reviewed with the patient, Chris Olson..  The patient and family have been given the opportunity to ask questions and make suggestions.  Juliann Pares, RN 08/23/2019, 6:57 AM

## 2019-08-23 NOTE — Progress Notes (Signed)
Adult Psychoeducational Group Note  Date:  08/23/2019 Time:  11:28 PM  Group Topic/Focus:  Wrap-Up Group:   The focus of this group is to help patients review their daily goal of treatment and discuss progress on daily workbooks.  Participation Level:  Active  Participation Quality:  Appropriate  Affect:  Appropriate  Cognitive:  Alert  Insight: Good  Engagement in Group:  Engaged  Modes of Intervention:  Education and Support  Additional Comments: Chris Olson actively paticipated in the group  Chris Olson 08/23/2019, 11:28 PM
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
## 2019-08-23 NOTE — [facility] Group Notes (Signed)
[facility] Group Notes: (Clinical Social Work)   08/23/2019      Type of Therapy:  Group Therapy   Participation Level:  Did Not Attend - was invited both individually by MHT and by overhead announcement, chose not to attend.   Ambrose Mantle, LCSW 08/23/2019, 1:44 PM

## 2019-08-24 LAB — LIPID PANEL
Cholesterol: 232 mg/dL — ABNORMAL HIGH (ref 0–200)
HDL: 68 mg/dL (ref >40)
LDL Cholesterol: 148 mg/dL — ABNORMAL HIGH (ref 0–99)
Total CHOL/HDL Ratio: 3.4 RATIO
Triglycerides: 78 mg/dL (ref <150)
VLDL: 16 mg/dL (ref 0–40)

## 2019-08-24 LAB — HEMOGLOBIN A1C
Hgb A1c MFr Bld: 5 % (ref 4.8–5.6)
Mean Plasma Glucose: 96.8 mg/dL

## 2019-08-24 LAB — TSH: TSH: 1.686 u[IU]/mL (ref 0.350–4.500)

## 2019-08-24 NOTE — [facility] Group Notes (Signed)
LCSW Group Therapy Note 08/24/2019 2:24 PM  Type of Therapy and Topic: Group Therapy: Overcoming Obstacles  Participation Level: Active  Description of Group:  In this group patients will be encouraged to explore what they see as obstacles to their own wellness and recovery. They will be guided to discuss their thoughts, feelings, and behaviors related to these obstacles. The group will process together ways to cope with barriers, with attention given to specific choices patients can make. Each patient will be challenged to identify changes they are motivated to make in order to overcome their obstacles. This group will be process-oriented, with patients participating in exploration of their own experiences as well as giving and receiving support and challenge from other group members.  Therapeutic Goals: 1. Patient will identify personal and current obstacles as they relate to admission. 2. Patient will identify barriers that currently interfere with their wellness or overcoming obstacles.  3. Patient will identify feelings, thought process and behaviors related to these barriers. 4. Patient will identify two changes they are willing to make to overcome these obstacles:   Summary of Patient Progress  Chris Olson was engaged and participated throughout the group session. Chris Olson shared with the group that he is very "worried and scared" when he is out in the community. He reports he feels "lost" and doesn't feel safe when he is not in the hospital. Chris Olson states that he is unsure what will help him overcome these obstacles at this time.    Therapeutic Modalities:  Cognitive Behavioral Therapy Solution Focused Therapy Motivational Interviewing Relapse Prevention Therapy   Chris Olson Clinical Social Worker

## 2019-08-24 NOTE — Plan of Care (Signed)
Nurse discussed anxiety, depression and coping skills with patient.  

## 2019-08-24 NOTE — Progress Notes (Signed)
Recreation Therapy Notes  Date: 6.28.21 Time: 0930 Location: 300 Hall Group Room  Group Topic: Stress Management  Goal Area(s) Addresses:  Patient will identify positive stress management techniques. Patient will identify benefits of using stress management post d/c.  Behavioral Response: Engaged  Intervention: Stress Management  Activity : Guided Imagery.  LRT read a script that took patients on a journey to the beach to enjoy the peaceful waves.  Patients were to listen and follow along as script was read.  Education:  Stress Management, Discharge Planning.   Education Outcome: Acknowledges Education  Clinical Observations/Feedback: Pt attended and participated in group activity.    Moneisha Vosler, LRT/CTRS         Nester Bachus A 08/24/2019 11:28 AM 

## 2019-08-24 NOTE — Tx Team (Signed)
Interdisciplinary Treatment and Diagnostic Plan Update  08/24/2019 Time of Session: 9:00am Chris Valente Jr. MRN: [id number]  Principal Diagnosis: <principal problem not specified>  Secondary Diagnoses: Active Problems:   Severe recurrent major depression without psychotic features ([place])   Current Medications:  Current Facility-Administered Medications  Medication Dose Route Frequency Provider Last Rate Last Admin  . acetaminophen (TYLENOL) tablet 650 mg  650 mg Oral Q6H PRN Lindon Romp A, NP      . alum & mag hydroxide-simeth (MAALOX/MYLANTA) 200-200-20 MG/5ML suspension 30 mL  30 mL Oral Q4H PRN Lindon Romp A, NP      . cariprazine (VRAYLAR) capsule 3 mg  3 mg Oral Daily Sharma Covert, MD   3 mg at 08/24/19 0740  . folic acid (FOLVITE) tablet 1 mg  1 mg Oral Daily Sharma Covert, MD   1 mg at 08/24/19 0739  . hydrOXYzine (ATARAX/VISTARIL) tablet 25 mg  25 mg Oral Q6H PRN Lindon Romp A, NP   25 mg at 08/24/19 0011  . lamoTRIgine (LAMICTAL) tablet 50 mg  50 mg Oral Daily Lindon Romp A, NP   50 mg at 08/24/19 0739  . LORazepam (ATIVAN) tablet 1 mg  1 mg Oral Q6H PRN Sharma Covert, MD      . magnesium hydroxide (MILK OF MAGNESIA) suspension 30 mL  30 mL Oral Daily PRN Lindon Romp A, NP      . nicotine polacrilex (NICORETTE) gum 2 mg  2 mg Oral PRN Sharma Covert, MD   2 mg at 08/24/19 0813  . ondansetron (ZOFRAN) tablet 4 mg  4 mg Oral Q8H PRN Rozetta Nunnery, NP      . PARoxetine (PAXIL) tablet 10 mg  10 mg Oral Daily Sharma Covert, MD   10 mg at 08/24/19 0740  . QUEtiapine (SEROQUEL) tablet 50 mg  50 mg Oral QHS Lindon Romp A, NP   50 mg at 08/23/19 2103  . thiamine tablet 100 mg  100 mg Oral Daily Sharma Covert, MD   100 mg at 08/24/19 0740   PTA Medications: Medications Prior to Admission  Medication Sig Dispense Refill Last Dose  . cariprazine (VRAYLAR) capsule Take 3 mg by mouth daily.     . hydrOXYzine (ATARAX/VISTARIL) 25 MG tablet Take 1  tablet (25 mg total) by mouth every 6 (six) hours as needed for anxiety. 30 tablet 0   . lamoTRIgine (LAMICTAL) 25 MG tablet Take 50 mg by mouth daily.     . QUEtiapine (SEROQUEL) 50 MG tablet Take 50 mg by mouth at bedtime.       Patient Stressors: Financial difficulties Marital or family conflict Substance abuse  Patient Strengths: Active sense of humor Average or above average intelligence Communication skills Motivation for treatment/growth Supportive family/friends  Treatment Modalities: Medication Management, Group therapy, Case management,  1 to 1 session with clinician, Psychoeducation, Recreational therapy.   Physician Treatment Plan for Primary Diagnosis: <principal problem not specified> Long Term Goal(s): Improvement in symptoms so as ready for discharge Improvement in symptoms so as ready for discharge   Short Term Goals: Ability to identify changes in lifestyle to reduce recurrence of condition will improve Ability to verbalize feelings will improve Ability to disclose and discuss suicidal ideas Ability to demonstrate self-control will improve Ability to identify and develop effective coping behaviors will improve Ability to maintain clinical measurements within normal limits will improve Compliance with prescribed medications will improve Ability to identify triggers associated with substance abuse/mental health  issues will improve Ability to identify changes in lifestyle to reduce recurrence of condition will improve Ability to verbalize feelings will improve Ability to disclose and discuss suicidal ideas Ability to demonstrate self-control will improve Ability to identify and develop effective coping behaviors will improve Ability to maintain clinical measurements within normal limits will improve Compliance with prescribed medications will improve Ability to identify triggers associated with substance abuse/mental health issues will improve  Medication  Management: Evaluate patient's response, side effects, and tolerance of medication regimen.  Therapeutic Interventions: 1 to 1 sessions, Unit Group sessions and Medication administration.  Evaluation of Outcomes: Not Met  Physician Treatment Plan for Secondary Diagnosis: Active Problems:   Severe recurrent major depression without psychotic features ([place])  Long Term Goal(s): Improvement in symptoms so as ready for discharge Improvement in symptoms so as ready for discharge   Short Term Goals: Ability to identify changes in lifestyle to reduce recurrence of condition will improve Ability to verbalize feelings will improve Ability to disclose and discuss suicidal ideas Ability to demonstrate self-control will improve Ability to identify and develop effective coping behaviors will improve Ability to maintain clinical measurements within normal limits will improve Compliance with prescribed medications will improve Ability to identify triggers associated with substance abuse/mental health issues will improve Ability to identify changes in lifestyle to reduce recurrence of condition will improve Ability to verbalize feelings will improve Ability to disclose and discuss suicidal ideas Ability to demonstrate self-control will improve Ability to identify and develop effective coping behaviors will improve Ability to maintain clinical measurements within normal limits will improve Compliance with prescribed medications will improve Ability to identify triggers associated with substance abuse/mental health issues will improve     Medication Management: Evaluate patient's response, side effects, and tolerance of medication regimen.  Therapeutic Interventions: 1 to 1 sessions, Unit Group sessions and Medication administration.  Evaluation of Outcomes: Not Met   RN Treatment Plan for Primary Diagnosis: <principal problem not specified> Long Term Goal(s): Knowledge of disease and therapeutic  regimen to maintain health will improve  Short Term Goals: Ability to verbalize feelings will improve, Ability to disclose and discuss suicidal ideas, Ability to identify and develop effective coping behaviors will improve and Compliance with prescribed medications will improve  Medication Management: RN will administer medications as ordered by provider, will assess and evaluate patient's response and provide education to patient for prescribed medication. RN will report any adverse and/or side effects to prescribing provider.  Therapeutic Interventions: 1 on 1 counseling sessions, Psychoeducation, Medication administration, Evaluate responses to treatment, Monitor vital signs and CBGs as ordered, Perform/monitor CIWA, COWS, AIMS and Fall Risk screenings as ordered, Perform wound care treatments as ordered.  Evaluation of Outcomes: Not Met   LCSW Treatment Plan for Primary Diagnosis: <principal problem not specified> Long Term Goal(s): Safe transition to appropriate next level of care at discharge, Engage patient in therapeutic group addressing interpersonal concerns.  Short Term Goals: Engage patient in aftercare planning with referrals and resources, Increase social support, Increase emotional regulation, Identify triggers associated with mental health/substance abuse issues and Increase skills for wellness and recovery  Therapeutic Interventions: Assess for all discharge needs, 1 to 1 time with Social worker, Explore available resources and support systems, Assess for adequacy in community support network, Educate family and significant other(s) on suicide prevention, Complete Psychosocial Assessment, Interpersonal group therapy.  Evaluation of Outcomes: Not Met  Progress in Treatment: Attending groups: No. Participating in groups: No. Taking medication as prescribed: Yes. Toleration medication: Yes.  Family/Significant other contact made: No, will contact:  supports when consents are  granted. Patient understands diagnosis: No. Discussing patient identified problems/goals with staff: Yes. Medical problems stabilized or resolved: Yes. Denies suicidal/homicidal ideation: Yes. Issues/concerns per patient self-inventory: Yes.  New problem(s) identified: Yes, Describe:  legal issues, unstable housing, financial stressors  New Short Term/Long Term Goal(s): medication management for mood stabilization; elimination of SI thoughts; development of comprehensive mental wellness/sobriety plan.  Patient Goals: "Not stressing so much. Take my meds. Eliminate SI."  Discharge Plan or Barriers: CSW assessing for appropriate referrals, patient recently admitted to unit.  Reason for Continuation of Hospitalization: Anxiety Delusions  Depression Medication stabilization  Estimated Length of Stay: 5-7 days  Attendees: Patient: Chris Olson.  08/24/2019 10:21 AM  Physician: Dr. Mallie Darting 08/24/2019 10:21 AM  Nursing:  08/24/2019 10:21 AM  RN Care Manager: 08/24/2019 10:21 AM  Social Worker: Stephanie Acre, [place] 08/24/2019 10:21 AM  Recreational Therapist:  08/24/2019 10:21 AM  Other:  08/24/2019 10:21 AM  Other:  08/24/2019 10:21 AM  Other: 08/24/2019 10:21 AM    Scribe for Treatment Team: Joellen Jersey, [place] 08/24/2019 10:21 AM
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
## 2019-08-25 MED ORDER — QUETIAPINE FUMARATE 100 MG PO TABS
100.0000 mg | ORAL_TABLET | Freq: Every day | ORAL | Status: DC
  Administered 2019-08-25 – 2019-08-28 (×4): 100 mg via ORAL
  Filled 2019-08-25 (×5): qty 1

## 2019-08-25 NOTE — Progress Notes (Signed)
   08/25/19 2100  Psych Admission Type (Psych Patients Only)  Admission Status Involuntary  Psychosocial Assessment  Patient Complaints Anxiety;Worrying  Eye Contact Fair  Facial Expression Anxious  Affect Sad;Anxious  Speech Logical/coherent  Interaction Assertive  Motor Activity Other (Comment) (WNL)  Appearance/Hygiene Unremarkable  Behavior Characteristics Anxious;Cooperative  Mood Depressed;Anxious  Thought Process  Coherency WDL  Content WDL  Delusions None reported or observed  Perception WDL  Hallucination None reported or observed  Judgment Impaired  Confusion None  Danger to Self  Current suicidal ideation? Denies  Danger to Others  Danger to Others None reported or observed   Pt seen at med window. Pt rates anxiety 8/10. Pt worried about his discharge plan. Pt states, "If I want to change my discharge plan, is that possible?" Pt told to speak to provider in the morning. Pt states that he thinks he might need more time, but he wasn't sure of his discharge date to begin with.
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
## 2019-08-25 NOTE — Progress Notes (Signed)
   08/24/19 2010  COVID-19 Daily Checkoff  Have you had a fever (temp > 37.80C/100F)  in the past 24 hours?  No  COVID-19 EXPOSURE  Have you traveled outside the state in the past 14 days? No  Have you been in contact with someone with a confirmed diagnosis of COVID-19 or PUI in the past 14 days without wearing appropriate PPE? No  Have you been living in the same home as a person with confirmed diagnosis of COVID-19 or a PUI (household contact)? No  Have you been diagnosed with COVID-19? No
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
## 2019-08-25 NOTE — Plan of Care (Signed)
Nurse discussed anxiety, depression and coping skills with patient.  

## 2019-08-25 NOTE — Progress Notes (Signed)
D:  Patient denied SI and HI, contracts for safety.  Denied A/V hallucinations.  Denied pain. A:  Medications administered per MD orders.  Emotional support and encouragement given patient. R:  Safety maintained with 15 minute checks.  

## 2019-08-25 NOTE — Progress Notes (Signed)
Recreation Therapy Notes  Animal-Assisted Activity (AAA) Program Checklist/Progress Notes Patient Eligibility Criteria Checklist & Daily Group note for Rec Tx Intervention  Date: 6.29.21 Time: 1430 Location: [address]   AAA/T Program Assumption of Risk Form signed by Engineer, production or Parent Legal Guardian  YES   Patient is free of allergies or sever asthma  YES  Patient reports no fear of animals YES   Patient reports no history of cruelty to animals YES   Patient understands his/her participation is voluntary YES  Patient washes hands before animal contact YES  Patient washes hands after animal contact YES   Behavioral Response: Engaged  Education: Charity fundraiser, Appropriate Animal Interaction   Education Outcome: Acknowledges understanding/In group clarification offered/Needs additional education.   Clinical Observations/Feedback: Pt attended and participated in activity.    [name], LRT/CTRS         [name] A 08/25/2019 3:34 PM

## 2019-08-25 NOTE — Progress Notes (Signed)
   08/24/19 2010  Psych Admission Type (Psych Patients Only)  Admission Status Involuntary  Psychosocial Assessment  Patient Complaints Depression;Hopelessness;Sadness;Worrying;Tension  Eye Contact Fair  Facial Expression Other (Comment) (appropriate)  Affect Appropriate to circumstance;Sad  Speech Logical/coherent  Interaction Assertive  Motor Activity Pacing  Appearance/Hygiene Unremarkable  Behavior Characteristics Pacing;Calm;Anxious;Cooperative  Mood Depressed;Anxious;Sad;Pleasant  Thought Process  Coherency WDL  Content WDL  Delusions None reported or observed  Perception WDL  Hallucination None reported or observed  Judgment Impaired  Confusion None  Danger to Self  Current suicidal ideation? Denies  Danger to Others  Danger to Others None reported or observed

## 2019-08-26 NOTE — Progress Notes (Signed)
Adult Psychoeducational Group Note  Date:  08/26/2019 Time:  8:11 AM  Group Topic/Focus:  Wrap-Up Group:   The focus of this group is to help patients review their daily goal of treatment and discuss progress on daily workbooks.  Participation Level:  Active  Participation Quality:  Appropriate  Affect:  Appropriate  Cognitive:  Appropriate  Insight: Appropriate  Engagement in Group:  Engaged  Modes of Intervention:  Discussion  Additional Comments:  Pt said day was a 5. His goal was to make all groups. He achieve his goal. His coping skill talking walking and exercise. Pt said he is concerned for his life.  Charna Busman Long 08/26/2019, 8:11 AM

## 2019-08-26 NOTE — Progress Notes (Signed)
The patient verbalized in group that nothing positive took place today. He states that he would like to go to a long-term rehab program but does not have anything set up as of this time.

## 2019-08-26 NOTE — Progress Notes (Addendum)
D. Pt presents with a flat affect/ depressed mood- calm, cooperative behavior- per pt's self inventory, pt rated his depression, hopelessness and anxiety all 10's. Pt writes that his goal today is to work on "myself", and that he will "make groups" to help him achieve his goal. Pt states, "I want to change my discharge plan. I want to go to rehab".  Pt currently endorses passive SI - vague thoughts without a  plan- and agrees to contact staff before acting on any harmful thoughts. Pt  denies  AVH A. Labs and vitals monitored. Pt compliant with medications. Pt supported emotionally and encouraged to express concerns and ask questions.   R. Pt remains safe with 15 minute checks. Will continue POC.
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
## 2019-08-26 NOTE — Progress Notes (Signed)
Pt had a phone call this morning that made him very upset and agitated. Pt stated, "I don't want to talk about it right now. But, can I speak to a nurse today about changing my discharge plan? I want to go to rehab now." Pt was told to mention this to the provider as well this morning. Pt given Vistaril 25 mg for anxiety. Pt remains safe on the unit.

## 2019-08-26 NOTE — Progress Notes (Signed)
[facility] NOVEL CORONAVIRUS (COVID-19) DAILY CHECK-OFF SYMPTOMS - answer yes or no to each - every day NO YES  Have you had a fever in the past 24 hours?  . Fever (Temp > 37.80C / 100F) X   Have you had any of these symptoms in the past 24 hours? . New Cough .  Sore Throat  .  Shortness of Breath .  Difficulty Breathing .  Unexplained Body Aches   X   Have you had any one of these symptoms in the past 24 hours not related to allergies?   . Runny Nose .  Nasal Congestion .  Sneezing   X   If you have had runny nose, nasal congestion, sneezing in the past 24 hours, has it worsened?  X   EXPOSURES - check yes or no X   Have you traveled outside the state in the past 14 days?  X   Have you been in contact with someone with a confirmed diagnosis of COVID-19 or PUI in the past 14 days without wearing appropriate PPE?  X   Have you been living in the same home as a person with confirmed diagnosis of COVID-19 or a PUI (household contact)?    X   Have you been diagnosed with COVID-19?    X              What to do next: Answered NO to all: Answered YES to anything:   Proceed with unit schedule Follow the [facility] Inpatient Flowsheet.   
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
## 2019-08-27 NOTE — Progress Notes (Signed)
D:  Patient's self inventory sheet, patient sleeps good, sleep medication helpful.  Fair appetite, low energy level, poor concentration.  Rated depression and hopeless 10, anxiety 7.  Withdrawals, chilling, cravings, agitation, irritability.  Denied SI.  Denied physical problems.  Denied physical pain.  Goal is nothing.  No plans.  "I feel like just giving up on life.  I'm tired of everyone giving up on me."  Not sure of discharge plans. A:  Medications administered per MD orders.  Emotional support and encouragement given patient. R:  Denied SI and HI, contracts for safety.  Denied A/V hallucinations.  Safety maintained with 15 minute checks.

## 2019-08-27 NOTE — Plan of Care (Signed)
Nurse discussed anxiety, depression and coping skills with patient.  

## 2019-08-27 NOTE — Progress Notes (Signed)
   08/27/19 2045  Psych Admission Type (Psych Patients Only)  Admission Status Involuntary  Psychosocial Assessment  Patient Complaints Anxiety;Depression  Eye Contact Brief  Facial Expression Anxious  Affect Sad;Anxious  Speech Logical/coherent  Interaction Assertive  Motor Activity Other (Comment) (WNL)  Appearance/Hygiene Unremarkable  Behavior Characteristics Cooperative;Anxious  Mood Depressed;Anxious  Thought Process  Coherency WDL  Content WDL  Delusions None reported or observed  Perception WDL  Hallucination None reported or observed  Judgment Poor  Confusion None  Danger to Self  Current suicidal ideation? Denies  Danger to Others  Danger to Others Reported or observed   Pt seen in the hallway. Pt rates his depression 8/10. Still feels like family have turned their backs on him. Pt stated that he was accepted at [facility] today but is waiting for an open bed. Pt encouraged to look at this as a step towards a better life. Pt cautioned that issues with his family will not be an easy fix.

## 2019-08-27 NOTE — Progress Notes (Signed)
Patient had little to share in group except to say that he is unclear about his discharge plans. His goal for tomorrow is to attend all of the groups and get discharged to a long-term treatment center.

## 2019-08-27 NOTE — Progress Notes (Signed)
Pt tearful this morning. Pt states that he cannot reach any member of his family by phone. "I think they have blocked my number. No one is calling me or answering me." Pt given Vistaril 25 mg. Pt encouraged to speak to the physician about contacting his family to maybe learn of any issues.

## 2019-08-28 NOTE — Progress Notes (Signed)
Adult Psychoeducational Group Note  Date:  08/28/2019 Time:  10:26 PM  Group Topic/Focus:  Wrap-Up Group:   The focus of this group is to help patients review their daily goal of treatment and discuss progress on daily workbooks.  Participation Level:  Did Not Attend  Participation Quality:  Did not attend  Affect:  Did not attend  Cognitive:  Did not attend  Insight: None  Engagement in Group:  Did not attend  Modes of Intervention:  Did not attend  Additional Comments:  Jariel did not attend group  Judithann Sheen 08/28/2019, 10:26 PM

## 2019-08-28 NOTE — Tx Team (Addendum)
Interdisciplinary Treatment and Diagnostic Plan Update  08/28/2019 Time of Session: 9:00am Chris Hammes Jr. MRN: [id number]  Principal Diagnosis: Severe recurrent major depression without psychotic features ([place])  Secondary Diagnoses: Principal Problem:   Severe recurrent major depression without psychotic features ([place])   Current Medications:  Current Facility-Administered Medications  Medication Dose Route Frequency Provider Last Rate Last Admin   acetaminophen (TYLENOL) tablet 650 mg  650 mg Oral Q6H PRN Rozetta Nunnery, NP       alum & mag hydroxide-simeth (MAALOX/MYLANTA) 200-200-20 MG/5ML suspension 30 mL  30 mL Oral Q4H PRN Rozetta Nunnery, NP       folic acid (FOLVITE) tablet 1 mg  1 mg Oral Daily Sharma Covert, MD   1 mg at 08/28/19 0910   hydrOXYzine (ATARAX/VISTARIL) tablet 25 mg  25 mg Oral Q6H PRN Lindon Romp A, NP   25 mg at 08/27/19 2111   lamoTRIgine (LAMICTAL) tablet 50 mg  50 mg Oral Daily Lindon Romp A, NP   50 mg at 08/28/19 0906   LORazepam (ATIVAN) tablet 1 mg  1 mg Oral Q6H PRN Sharma Covert, MD       magnesium hydroxide (MILK OF MAGNESIA) suspension 30 mL  30 mL Oral Daily PRN Lindon Romp A, NP       nicotine polacrilex (NICORETTE) gum 2 mg  2 mg Oral PRN Sharma Covert, MD   2 mg at 08/28/19 0952   ondansetron (ZOFRAN) tablet 4 mg  4 mg Oral Q8H PRN Rozetta Nunnery, NP       PARoxetine (PAXIL) tablet 10 mg  10 mg Oral Daily Sharma Covert, MD   10 mg at 08/28/19 0908   QUEtiapine (SEROQUEL) tablet 100 mg  100 mg Oral QHS Connye Burkitt, NP   100 mg at 08/27/19 2111   thiamine tablet 100 mg  100 mg Oral Daily Sharma Covert, MD   100 mg at 08/28/19 0907   PTA Medications: Medications Prior to Admission  Medication Sig Dispense Refill Last Dose   cariprazine (VRAYLAR) capsule Take 3 mg by mouth daily.      hydrOXYzine (ATARAX/VISTARIL) 25 MG tablet Take 1 tablet (25 mg total) by mouth every 6 (six) hours as needed for  anxiety. 30 tablet 0    lamoTRIgine (LAMICTAL) 25 MG tablet Take 50 mg by mouth daily.      QUEtiapine (SEROQUEL) 50 MG tablet Take 50 mg by mouth at bedtime.       Patient Stressors: Financial difficulties Marital or family conflict Substance abuse  Patient Strengths: Active sense of humor Average or above average intelligence Communication skills Motivation for treatment/growth Supportive family/friends  Treatment Modalities: Medication Management, Group therapy, Case management,  1 to 1 session with clinician, Psychoeducation, Recreational therapy.   Physician Treatment Plan for Primary Diagnosis: Severe recurrent major depression without psychotic features ([place]) Long Term Goal(s): Improvement in symptoms so as ready for discharge Improvement in symptoms so as ready for discharge   Short Term Goals: Ability to identify changes in lifestyle to reduce recurrence of condition will improve Ability to verbalize feelings will improve Ability to disclose and discuss suicidal ideas Ability to demonstrate self-control will improve Ability to identify and develop effective coping behaviors will improve Ability to maintain clinical measurements within normal limits will improve Compliance with prescribed medications will improve Ability to identify triggers associated with substance abuse/mental health issues will improve Ability to identify changes in lifestyle to reduce recurrence of condition will  improve Ability to verbalize feelings will improve Ability to disclose and discuss suicidal ideas Ability to demonstrate self-control will improve Ability to identify and develop effective coping behaviors will improve Ability to maintain clinical measurements within normal limits will improve Compliance with prescribed medications will improve Ability to identify triggers associated with substance abuse/mental health issues will improve  Medication Management: Evaluate patient's  response, side effects, and tolerance of medication regimen.  Therapeutic Interventions: 1 to 1 sessions, Unit Group sessions and Medication administration.  Evaluation of Outcomes: Not Met  Physician Treatment Plan for Secondary Diagnosis: Principal Problem:   Severe recurrent major depression without psychotic features ([place])  Long Term Goal(s): Improvement in symptoms so as ready for discharge Improvement in symptoms so as ready for discharge   Short Term Goals: Ability to identify changes in lifestyle to reduce recurrence of condition will improve Ability to verbalize feelings will improve Ability to disclose and discuss suicidal ideas Ability to demonstrate self-control will improve Ability to identify and develop effective coping behaviors will improve Ability to maintain clinical measurements within normal limits will improve Compliance with prescribed medications will improve Ability to identify triggers associated with substance abuse/mental health issues will improve Ability to identify changes in lifestyle to reduce recurrence of condition will improve Ability to verbalize feelings will improve Ability to disclose and discuss suicidal ideas Ability to demonstrate self-control will improve Ability to identify and develop effective coping behaviors will improve Ability to maintain clinical measurements within normal limits will improve Compliance with prescribed medications will improve Ability to identify triggers associated with substance abuse/mental health issues will improve     Medication Management: Evaluate patient's response, side effects, and tolerance of medication regimen.  Therapeutic Interventions: 1 to 1 sessions, Unit Group sessions and Medication administration.  Evaluation of Outcomes: Not Met   RN Treatment Plan for Primary Diagnosis: Severe recurrent major depression without psychotic features ([place]) Long Term Goal(s): Knowledge of disease and  therapeutic regimen to maintain health will improve  Short Term Goals: Ability to verbalize feelings will improve, Ability to disclose and discuss suicidal ideas, Ability to identify and develop effective coping behaviors will improve and Compliance with prescribed medications will improve  Medication Management: RN will administer medications as ordered by provider, will assess and evaluate patient's response and provide education to patient for prescribed medication. RN will report any adverse and/or side effects to prescribing provider.  Therapeutic Interventions: 1 on 1 counseling sessions, Psychoeducation, Medication administration, Evaluate responses to treatment, Monitor vital signs and CBGs as ordered, Perform/monitor CIWA, COWS, AIMS and Fall Risk screenings as ordered, Perform wound care treatments as ordered.  Evaluation of Outcomes: Not Met   LCSW Treatment Plan for Primary Diagnosis: Severe recurrent major depression without psychotic features ([place]) Long Term Goal(s): Safe transition to appropriate next level of care at discharge, Engage patient in therapeutic group addressing interpersonal concerns.  Short Term Goals: Engage patient in aftercare planning with referrals and resources, Increase social support, Increase emotional regulation, Identify triggers associated with mental health/substance abuse issues and Increase skills for wellness and recovery  Therapeutic Interventions: Assess for all discharge needs, 1 to 1 time with Social worker, Explore available resources and support systems, Assess for adequacy in community support network, Educate family and significant other(s) on suicide prevention, Complete Psychosocial Assessment, Interpersonal group therapy.  Evaluation of Outcomes: Not Met  Progress in Treatment: Attending groups: No. Participating in groups: No. Taking medication as prescribed: Yes. Toleration medication: Yes. Family/Significant other contact made: No,  will  contact:  supports when consents are granted. Patient understands diagnosis: No. Discussing patient identified problems/goals with staff: Yes. Medical problems stabilized or resolved: Yes. Denies suicidal/homicidal ideation: Yes. Issues/concerns per patient self-inventory: Yes.  New problem(s) identified: Yes, Describe:  legal issues, unstable housing, financial stressors  New Short Term/Long Term Goal(s): medication management for mood stabilization; elimination of SI thoughts; development of comprehensive mental wellness/sobriety plan.  Patient Goals: "Not stressing so much. Take my meds. Eliminate SI."  Discharge Plan or Barriers: CSW assessing for appropriate referrals, patient recently admitted to unit. Patient expressed interest in residential treatment referrals at discharge. CSW has referred the patient to [facility] and [place].   Reason for Continuation of Hospitalization: Anxiety Delusions  Depression Medication stabilization  Estimated Length of Stay: 5-7 days  Attendees: Patient: Chris Olson.  08/28/2019 11:07 AM  Physician: Dr. Mallie Darting 08/28/2019 11:07 AM  Nursing:  08/28/2019 11:07 AM  RN Care Manager: 08/28/2019 11:07 AM  Social Worker: Stephanie Acre, [place]; [place], [place] 08/28/2019 11:07 AM  Recreational Therapist:  08/28/2019 11:07 AM  Other:  08/28/2019 11:07 AM  Other:  08/28/2019 11:07 AM  Other: 08/28/2019 11:07 AM    Scribe for Treatment Team: Marylee Floras, [place] 08/28/2019 11:07 AM

## 2019-08-28 NOTE — Progress Notes (Signed)
   08/28/19 0619  Vital Signs  Pulse Rate 93  BP 117/83  BP Location Right Arm  BP Method Automatic  Patient Position (if appropriate) Standing   D: Patient presents with an anxious and depressed affect. Patient rated anxiety 2/10 and depression 8/10. Patient isolated in room for most of the day.  A:  Patient took scheduled medicine.  Support and encouragement provided Routine safety checks conducted every 15 minutes. Patient  Informed to notify staff with any concerns.   R: Safety maintained

## 2019-08-28 NOTE — Progress Notes (Signed)
Recreation Therapy Notes  Date:  7.2.21 Time: 0930 Location: 300 Hall Group Room  Group Topic: Stress Management  Goal Area(s) Addresses:  Patient will identify positive stress management techniques. Patient will identify benefits of using stress management post d/c.  Intervention: Stress Management  Activity: Meditation.  LRT played a meditation that focused on taking on the characteristics of a mountain into your meditation and everyday life.  Patients were to listen and follow along as meditation played to fully engage in activity.    Education:  Stress Management, Discharge Planning.   Education Outcome: Acknowledges Education  Clinical Observations/Feedback: Pt did not attend group session.    [name], LRT/CTRS         Lillia Abed, Tresa Jolley A 08/28/2019 10:06 AM
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
## 2019-08-29 MED ORDER — PAROXETINE HCL 10 MG PO TABS: 10.0000 mg | ORAL_TABLET | Freq: Every day | ORAL | 0 refills | Status: DC

## 2019-08-29 MED ORDER — NICOTINE POLACRILEX 2 MG MT GUM: 2.0000 mg | CHEWING_GUM | OROMUCOSAL | 0 refills | Status: DC | PRN

## 2019-08-29 MED ORDER — LAMOTRIGINE 25 MG PO TABS: 50.0000 mg | ORAL_TABLET | Freq: Every day | ORAL | 0 refills | Status: DC

## 2019-08-29 MED ORDER — QUETIAPINE FUMARATE 100 MG PO TABS: 100.0000 mg | ORAL_TABLET | Freq: Every day | ORAL | 0 refills | Status: DC

## 2019-08-29 MED ORDER — HYDROXYZINE HCL 25 MG PO TABS: 25.0000 mg | ORAL_TABLET | Freq: Four times a day (QID) | ORAL | 0 refills | Status: DC | PRN

## 2019-08-29 NOTE — [facility] Suicide Risk Assessment (Signed)
[facility] INPATIENT:  Family/Significant Other Suicide Prevention Education  Suicide Prevention Education:  Patient Refusal for Family/Significant Other Suicide Prevention Education: The patient Chris Olson. has refused to provide written consent for family/significant other to be provided Family/Significant Other Suicide Prevention Education during admission and/or prior to discharge.  Physician notified.  Carloyn Jaeger Grossman-Orr 08/29/2019, 9:20 AM
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
## 2019-08-29 NOTE — BHH Suicide Risk Assessment (Signed)
[facility] Discharge Suicide Risk Assessment   Principal Problem: Severe recurrent major depression without psychotic features [facility]) Discharge Diagnoses: Principal Problem:   Severe recurrent major depression without psychotic features (HCC)   Total Time spent with patient: 15 minutes  Musculoskeletal: Strength & Muscle Tone: within normal limits Gait & Station: normal Patient leans: N/A  Psychiatric Specialty Exam: Review of Systems  All other systems reviewed and are negative.   Blood pressure (!) 121/95, pulse 97, temperature 98.6 F (37 C), temperature source Oral, resp. rate 16, height 5\' 8"  (1.727 m), weight 64.9 kg, SpO2 100 %.Body mass index is 21.74 kg/m.  General Appearance: Casual  Eye Contact::  Fair  Speech:  Normal Rate409  Volume:  Normal  Mood:  Euthymic  Affect:  Congruent  Thought Process:  Coherent and Descriptions of Associations: Intact  Orientation:  Full (Time, Place, and Person)  Thought Content:  Logical  Suicidal Thoughts:  No  Homicidal Thoughts:  No  Memory:  Immediate;   Fair Recent;   Fair Remote;   Fair  Judgement:  Intact  Insight:  Fair  Psychomotor Activity:  Normal  Concentration:  Fair  Recall:  [id number] of Knowledge:Fair  Language: Fair  Akathisia:  Negative  Handed:  Right  AIMS (if indicated):     Assets:  Desire for Improvement Resilience  Sleep:  Number of Hours: 6.25  Cognition: WNL  ADL's:  Intact   Mental Status Per Nursing Assessment::   On Admission:  Suicidal ideation indicated by others, Self-harm thoughts  Demographic Factors:  Male, Low socioeconomic status, Living alone and Unemployed  Loss Factors: Legal issues  Historical Factors: Impulsivity  Risk Reduction Factors:   NA  Continued Clinical Symptoms:  Depression:   Impulsivity Alcohol/Substance Abuse/Dependencies  Cognitive Features That Contribute To Risk:  None    Suicide Risk:  Minimal: No identifiable suicidal ideation.  Patients presenting  with no risk factors but with morbid ruminations; may be classified as minimal risk based on the severity of the depressive symptoms   Follow-up Information    [facility] Follow up on 09/10/2019.   Specialty: Behavioral Health Why: You have a Virtual appointment for therapy on 09/10/19 at 11:00 am .  You also have an appointment for medication management on 09/15/19 at 4:00 pm. These are Virtual appointments.   Contact information: [address] [place] [phone]       [facility].   Specialty: Behavioral Health Why: Please go to this provider's office for substance abuse intensive outpatient services, during their walk in hours:  Monday through Friday, 7:00 am to 10:30 am.  NEW ADDRESS:  [address] [place], [place] Contact information: [address] [place] [phone]        [facility] Call.   Why: Please follow-up with this provider for outpatient services.  Contact information: [address] [place] [phone]        [facility] Follow up.   Why: A referral has been placed on your behalf. Contact information: [address] [place] [phone]        [facility] Follow up.   Contact information: [phone] [place] [phone]               Plan Of Care/Follow-up recommendations:  Activity:  ad  lib  Antonieta Pert, MD 08/29/2019, 7:50 AM

## 2019-08-29 NOTE — Progress Notes (Signed)
Pt denies SI/HI. Pt received both written and verbal discharge instructions. Pt agreed to f/u appt and medication regimen. Pt receive an AVS, SRA, transitional record and prescriptions. Pt gathered belongings from room and locker. Pt safely discharged to the lobby and transported by lyft transportation system.

## 2019-08-29 NOTE — Progress Notes (Signed)
Pt reported to this writer that he is ready for discharge. Pt reports changing his mind about going to residential treatment and would like to be discharged to his previous housing arrangement. Pt does not want to loose his housing and would like to be discharged today if possible. Pt requested that request be noted in chart.

## 2019-08-29 NOTE — Progress Notes (Signed)
  [facility] Adult Case Management Discharge Plan :  Will you be returning to the same living situation after discharge:  Yes,  boarding house At discharge, do you have transportation home?: Yes,  CSW arranged a Lyft Do you have the ability to pay for your medications: Yes,  Medicaid  Release of information consent forms completed and emailed to Medical Records, then turned in to Medical Records by CSW.   Patient to Follow up at:  Follow-up Information    [facility] Follow up on 09/10/2019.   Specialty: Behavioral Health Why: You have a Virtual appointment for therapy on 09/10/19 at 11:00 am .  You also have an appointment for medication management on 09/15/19 at 4:00 pm. These are Virtual appointments.   Contact information: [address] [place] [address] [phone]       [facility].   Specialty: Behavioral Health Why: If you choose this option, please go to this provider's office for substance abuse intensive outpatient services, during their walk in hours:  Monday through Friday, 7:00 am to 10:30 am.  NEW ADDRESS:  [address] [place], [place] [address] Contact information: [address] [place] [address] [phone]        [facility] Call.   Why: Please follow-up with this provider for outpatient services, if you choose to pursue this option. Contact information: [address] [place] [address] [phone]        [facility] Follow up.   Why: A referral has been placed on your behalf. Contact information: [address] [place] [address] [phone]        [facility] Follow up.   Why: Call this agency for rehab, if you choose to pursue that option. Contact information: [address] [place] [address] [phone]               Next level of care provider has access to [facility] Link:no  Safety Planning and Suicide Prevention discussed: No.  Patient refused     Has patient been referred to the Quitline?: N/A patient is not a smoker  Patient has been referred for addiction treatment: Yes   Patient stated to weekend CSW that he goes to [facility].  No appointment was arranged by weekday staff for that facility.  CSW advised him of the arrangements that have been made, and told him that if he wishes to return to [facility] he may certainly do so, and to be sure to take his hospital discharge paperwork to them so that they can see what medications he is on currently.  He acknowledged understanding.   Lynnell Chad, LCSW 08/29/2019, 9:25 AM

## 2019-08-29 NOTE — Progress Notes (Signed)
   08/28/19 2030  COVID-19 Daily Checkoff  Have you had a fever (temp > 37.80C/100F)  in the past 24 hours?  No  COVID-19 EXPOSURE  Have you traveled outside the state in the past 14 days? No  Have you been in contact with someone with a confirmed diagnosis of COVID-19 or PUI in the past 14 days without wearing appropriate PPE? No  Have you been living in the same home as a person with confirmed diagnosis of COVID-19 or a PUI (household contact)? No  Have you been diagnosed with COVID-19? No

## 2019-08-29 NOTE — [facility] Suicide Risk Assessment (Signed)
[place] Discharge Suicide Risk Assessment   Principal Problem: Severe recurrent major depression without psychotic features [facility]) Discharge Diagnoses: Principal Problem:   Severe recurrent major depression without psychotic features (HCC)   Total Time spent with patient: 20 minutes  Musculoskeletal: Strength & Muscle Tone: within normal limits Gait & Station: normal Patient leans: N/A  Psychiatric Specialty Exam: Review of Systems  All other systems reviewed and are negative.   Blood pressure (!) 121/95, pulse 97, temperature 98.6 F (37 C), temperature source Oral, resp. rate 16, height 5\' 8"  (1.727 m), weight 64.9 kg, SpO2 100 %.Body mass index is 21.74 kg/m.  General Appearance: Casual  Eye Contact::  Fair  Speech:  Normal Rate409  Volume:  Normal  Mood:  Euthymic  Affect:  Congruent  Thought Process:  Coherent and Descriptions of Associations: Intact  Orientation:  Full (Time, Place, and Person)  Thought Content:  Logical  Suicidal Thoughts:  No  Homicidal Thoughts:  No  Memory:  Immediate;   Fair Recent;   Fair Remote;   Fair  Judgement:  Intact  Insight:  Fair  Psychomotor Activity:  Normal  Concentration:  Fair  Recall:  [id number] of Knowledge:Fair  Language: Fair  Akathisia:  Negative  Handed:  Right  AIMS (if indicated):     Assets:  Desire for Improvement Resilience  Sleep:  Number of Hours: 6.25  Cognition: WNL  ADL's:  Intact   Mental Status Per Nursing Assessment::   On Admission:  Suicidal ideation indicated by others, Self-harm thoughts  Demographic Factors:  Male, Low socioeconomic status, Living alone and Unemployed  Loss Factors: Legal issues  Historical Factors: Impulsivity  Risk Reduction Factors:   NA  Continued Clinical Symptoms:  Bipolar Disorder:   Mixed State Depression:   Comorbid alcohol abuse/dependence Impulsivity Alcohol/Substance Abuse/Dependencies  Cognitive Features That Contribute To Risk:  None    Suicide  Risk:  Minimal: No identifiable suicidal ideation.  Patients presenting with no risk factors but with morbid ruminations; may be classified as minimal risk based on the severity of the depressive symptoms   Follow-up Information    [facility] Follow up on 09/10/2019.   Specialty: Behavioral Health Why: You have a Virtual appointment for therapy on 09/10/19 at 11:00 am .  You also have an appointment for medication management on 09/15/19 at 4:00 pm. These are Virtual appointments.   Contact information: [phone] [place] [phone]       [facility] Follow up.   Specialty: Behavioral Health Why: Please go to this provider's office for substance abuse intensive outpatient services, during their walk in hours:  Monday through Friday, 7:00 am to 10:30 am.  NEW ADDRESS:  [address], [place], [place] Contact information: [address] [place] [phone]        [facility] Call.   Why: Please follow-up with this provider for outpatient services.  Contact information: [address] [place] [phone]        [facility] Follow up.   Why: A referral has been placed on your behalf. Contact information: [address] [place] [phone]        [facility] Follow up.   Contact information: [phone] [place] [phone]  Plan Of Care/Follow-up recommendations:  Activity:  ad lib  Antonieta Pert, MD 08/29/2019, 8:29 AM

## 2019-08-29 NOTE — Progress Notes (Signed)
   08/28/19 2030  Psych Admission Type (Psych Patients Only)  Admission Status Involuntary  Psychosocial Assessment  Patient Complaints Depression;Worrying  Eye Contact Fair  Facial Expression Anxious;Sad;Worried  Affect Anxious;Depressed;Sad  Speech Logical/coherent  Interaction Assertive  Motor Activity Other (Comment) (WNL)  Appearance/Hygiene Unremarkable  Behavior Characteristics Cooperative;Anxious;Calm;Pacing  Mood Depressed;Anxious;Sad  Thought Process  Coherency WDL  Content WDL  Delusions None reported or observed  Perception WDL  Hallucination None reported or observed  Judgment Poor  Confusion None  Danger to Self  Current suicidal ideation? Denies  Danger to Others  Danger to Others None reported or observed  Danger to Others Abnormal  Harmful Behavior to others No threats or harm toward other people

## 2019-09-05 ENCOUNTER — Ambulatory Visit (HOSPITAL_COMMUNITY)
Admission: EM | Admit: 2019-09-05 | Discharge: 2019-09-06 | Disposition: A | Discharge status: Home / Self Care | Payer: Medicaid Other | Source: Home / Self Care

## 2019-09-05 ENCOUNTER — Encounter (HOSPITAL_COMMUNITY): Payer: Self-pay | Admitting: Emergency Medicine

## 2019-09-05 ENCOUNTER — Emergency Department (HOSPITAL_COMMUNITY)
Admission: EM | Admit: 2019-09-05 | Discharge: 2019-09-05 | Disposition: A | Discharge status: Other Acute Inpatient Hospital | Payer: Medicaid Other | Attending: Emergency Medicine | Admitting: Emergency Medicine

## 2019-09-05 ENCOUNTER — Other Ambulatory Visit: Payer: Self-pay

## 2019-09-05 ENCOUNTER — Ambulatory Visit (HOSPITAL_COMMUNITY)
Admission: EM | Admit: 2019-09-05 | Discharge: 2019-09-05 | Disposition: A | Discharge status: Other Acute Inpatient Hospital | Payer: Medicaid Other | Source: Home / Self Care

## 2019-09-05 DIAGNOSIS — F341 Dysthymic disorder: Secondary | ICD-10-CM | POA: Diagnosis not present

## 2019-09-05 DIAGNOSIS — F17210 Nicotine dependence, cigarettes, uncomplicated: Secondary | ICD-10-CM | POA: Insufficient documentation

## 2019-09-05 DIAGNOSIS — R45851 Suicidal ideations: Secondary | ICD-10-CM | POA: Insufficient documentation

## 2019-09-05 DIAGNOSIS — Z20822 Contact with and (suspected) exposure to covid-19: Secondary | ICD-10-CM | POA: Diagnosis not present

## 2019-09-05 DIAGNOSIS — F332 Major depressive disorder, recurrent severe without psychotic features: Secondary | ICD-10-CM

## 2019-09-05 LAB — COMPREHENSIVE METABOLIC PANEL
ALT: 24 U/L (ref 0–44)
AST: 26 U/L (ref 15–41)
Albumin: 4.6 g/dL (ref 3.5–5.0)
Alkaline Phosphatase: 51 U/L (ref 38–126)
Anion gap: 11 (ref 5–15)
BUN: 16 mg/dL (ref 6–20)
CO2: 27 mmol/L (ref 22–32)
Calcium: 9 mg/dL (ref 8.9–10.3)
Chloride: 102 mmol/L (ref 98–111)
Creatinine, Ser: 1.14 mg/dL (ref 0.61–1.24)
GFR calc Af Amer: >60 mL/min (ref >60)
GFR calc non Af Amer: >60 mL/min (ref >60)
Glucose, Bld: 90 mg/dL (ref 70–99)
Potassium: 4.2 mmol/L (ref 3.5–5.1)
Sodium: 140 mmol/L (ref 135–145)
Total Bilirubin: 0.7 mg/dL (ref 0.3–1.2)
Total Protein: 7.2 g/dL (ref 6.5–8.1)

## 2019-09-05 LAB — CBC WITH DIFFERENTIAL/PLATELET
Abs Immature Granulocytes: 0.05 10*3/uL (ref 0.00–0.07)
Basophils Absolute: 0.1 10*3/uL (ref 0.0–0.1)
Basophils Relative: 1 %
Eosinophils Absolute: 0.2 10*3/uL (ref 0.0–0.5)
Eosinophils Relative: 2 %
HCT: 46.8 % (ref 39.0–52.0)
Hemoglobin: 16 g/dL (ref 13.0–17.0)
Immature Granulocytes: 0 %
Lymphocytes Relative: 17 %
Lymphs Abs: 1.9 10*3/uL (ref 0.7–4.0)
MCH: 33.3 pg (ref 26.0–34.0)
MCHC: 34.2 g/dL (ref 30.0–36.0)
MCV: 97.5 fL (ref 80.0–100.0)
Monocytes Absolute: 0.7 10*3/uL (ref 0.1–1.0)
Monocytes Relative: 7 %
Neutro Abs: 8.3 10*3/uL — ABNORMAL HIGH (ref 1.7–7.7)
Neutrophils Relative %: 73 %
Platelets: 177 10*3/uL (ref 150–400)
RBC: 4.8 MIL/uL (ref 4.22–5.81)
RDW: 11.7 % (ref 11.5–15.5)
WBC: 11.2 10*3/uL — ABNORMAL HIGH (ref 4.0–10.5)
nRBC: 0 % (ref 0.0–0.2)

## 2019-09-05 LAB — URINALYSIS, ROUTINE W REFLEX MICROSCOPIC
Bacteria, UA: NONE SEEN
Bilirubin Urine: NEGATIVE
Glucose, UA: NEGATIVE mg/dL
Hgb urine dipstick: NEGATIVE
Ketones, ur: NEGATIVE mg/dL
Leukocytes,Ua: NEGATIVE
Nitrite: NEGATIVE
Protein, ur: 30 mg/dL — AB
Specific Gravity, Urine: 1.03 (ref 1.005–1.030)
pH: 5 (ref 5.0–8.0)

## 2019-09-05 LAB — RAPID URINE DRUG SCREEN, HOSP PERFORMED
Amphetamines: NOT DETECTED
Barbiturates: NOT DETECTED
Benzodiazepines: NOT DETECTED
Cocaine: POSITIVE — AB
Opiates: NOT DETECTED
Tetrahydrocannabinol: NOT DETECTED

## 2019-09-05 LAB — ACETAMINOPHEN LEVEL: Acetaminophen (Tylenol), Serum: <10 ug/mL — ABNORMAL LOW (ref 10–30)

## 2019-09-05 LAB — ETHANOL: Alcohol, Ethyl (B): <10 mg/dL (ref <10)

## 2019-09-05 LAB — SALICYLATE LEVEL: Salicylate Lvl: <7 mg/dL — ABNORMAL LOW (ref 7.0–30.0)

## 2019-09-05 LAB — SARS CORONAVIRUS 2 BY RT PCR (HOSPITAL ORDER, PERFORMED IN ~~LOC~~ HOSPITAL LAB): SARS Coronavirus 2: NEGATIVE

## 2019-09-05 MED ORDER — ALUM & MAG HYDROXIDE-SIMETH 200-200-20 MG/5ML PO SUSP: 30.0000 mL | ORAL | Status: DC | PRN

## 2019-09-05 MED ORDER — ACETAMINOPHEN 325 MG PO TABS: 650.0000 mg | ORAL_TABLET | Freq: Four times a day (QID) | ORAL | Status: DC | PRN

## 2019-09-05 MED ORDER — MAGNESIUM HYDROXIDE 400 MG/5ML PO SUSP: 30.0000 mL | Freq: Every day | ORAL | Status: DC | PRN

## 2019-09-05 MED ORDER — PAROXETINE HCL 10 MG PO TABS: 10.0000 mg | ORAL_TABLET | Freq: Every day | ORAL | Status: DC

## 2019-09-05 MED ORDER — QUETIAPINE FUMARATE 100 MG PO TABS
100.0000 mg | ORAL_TABLET | Freq: Every day | ORAL | Status: DC
  Administered 2019-09-05: 100 mg via ORAL
  Filled 2019-09-05 (×2): qty 1

## 2019-09-05 MED ORDER — HYDROXYZINE HCL 25 MG PO TABS: 25.0000 mg | ORAL_TABLET | Freq: Four times a day (QID) | ORAL | Status: DC | PRN

## 2019-09-05 MED ORDER — LAMOTRIGINE 25 MG PO TABS: 50.0000 mg | ORAL_TABLET | Freq: Every day | ORAL | Status: DC

## 2019-09-05 MED ORDER — ACETAMINOPHEN 325 MG PO TABS: 650.0000 mg | ORAL_TABLET | ORAL | Status: DC | PRN

## 2019-09-05 NOTE — ED Triage Notes (Signed)
Pt brought in by [employer] for SI with plan to hang himself or OD on medications. Reports recently broke up with girlfriend and feels everyone given up on him. Reports drinking a lot of ETOH to deal with pains averago 3-4 40oz/day.

## 2019-09-05 NOTE — Progress Notes (Signed)
Patient has been accepted to [facility]. Please call nursing at [phone] to give report.

## 2019-09-05 NOTE — ED Provider Notes (Signed)
Behavioral Health Admission H&P [facility] & OBS)  Date: 09/06/19 Patient Name: Chris Olson. MRN: [id number] Chief Complaint:  Chief Complaint  Patient presents with  . Suicidal  . Depression      Diagnoses:  Final diagnoses:  Severe recurrent major depression without psychotic features (HCC)    HPI: Patient presented to the emergency department with suicidal ideations with thoughts of overdosing or hanging himself from a ceiling fan. He was transferred to [facility] for continuous assessment.   Patient was recently hospitalized at behavioral health hospital. He was discharged on 08/29/19.  He reports after discharge that he continued to use cocaine and alcohol; last use of alcohol and cocaine was yesterday. He reports that he drinks three or four 40-ounce beers per day. He uses cocaine about twice per month. His urine drug screen was positive for cocaine, otherwise negative.  He states that he has been taking his medication. He reports that he is having suicidal thoughts with plans of overdosing or hanging himself. He has no history of suicide attempt. He would like assistance with placement at [facility] for substance abuse treatment.   On evaluation, patient is alert and oriented x 4, pleasant and cooperative. Speech is clear and coherent. He reports his mood as depressed and anxious. Affect is congruent with mood. He continues to report suicidal thoughts. He denies homicidal thoughts. He denies auditory and visual hallucinations. He does not appear to be responding to internal stimuli. No evidence of delusional thought content.   PHQ 2-9:    ED from 09/05/2019 in [facility] from 08/22/2019 in [facility]  Thoughts that you would be better off dead, or of hurting yourself in some way Nearly every day Nearly every day  PHQ-9 Total Score 27 12        ED from 09/05/2019 in [facility] Admission  (Dis[facility]) [facility] from 08[facility]22/2019 [facility]  C-SSRS RISK CATEGORY High Risk High Risk High Risk       Total Time spent with patient: 30 minutes  Musculoskeletal  Strength & Muscle Tone: within normal limits Gait & Station: normal Patient leans: N/A  Psychiatric Specialty Exam  Presentation General Appearance: Casual;Fairly Groomed  Eye Contact:Good  Speech:Clear and Coherent;Normal Rate  Speech Volume:Normal  Handedness:No data recorded  Mood and Affect  Mood:Anxious;Depressed;Hopeless;Worthless  Affect:Depressed;Congruent   Thought Process  Thought Processes:Coherent;Linear;Goal Directed  Descriptions of Associations:Intact  Orientation:Full (Time, Place and Person)  Thought Content:Logical  Hallucinations:Hallucinations: None  Ideas of Reference:None  Suicidal Thoughts:Suicidal Thoughts: Yes, Active SI Active Intent and/or Plan: With Intent;With Plan;With Means to Carry Out  Homicidal Thoughts:Homicidal Thoughts: No   Sensorium  Memory:Immediate Good;Recent Good;Remote Good  Judgment:Intact  Insight:Fair   Executive Functions  Concentration:Fair  Attention Span:Fair  Recall:Good  Fund of Knowledge:Good  Language:Good   Psychomotor Activity  Psychomotor Activity:Psychomotor Activity: Normal   Assets  Assets:Communication Skills;Desire for Improvement;Physical Health   Sleep  Sleep:Sleep: Fair   Physical Exam Constitutional:      General: He is not in acute distress.    Appearance: He is not ill-appearing, toxic-appearing or diaphoretic.  HENT:     Head: Normocephalic.     Right Ear: External ear normal.     Left Ear: External ear normal.  Eyes:     Pupils: Pupils are equal, round, and reactive to light.  Cardiovascular:     Rate and Rhythm:  Normal rate.  Pulmonary:     Effort: Pulmonary effort is normal. No respiratory  distress.  Musculoskeletal:        General: Normal range of motion.  Skin:    General: Skin is warm and dry.  Neurological:     Mental Status: He is alert and oriented to person, place, and time.  Psychiatric:        Mood and Affect: Mood is depressed.        Speech: Speech normal.        Behavior: Behavior is cooperative.        Thought Content: Thought content is not paranoid or delusional. Thought content includes suicidal ideation. Thought content does not include homicidal ideation. Thought content does not include suicidal plan.    Review of Systems  Constitutional: Negative for chills, diaphoresis, fever, malaise/fatigue and weight loss.  Respiratory: Negative for cough and shortness of breath.   Cardiovascular: Negative for chest pain and palpitations.  Gastrointestinal: Negative for diarrhea, nausea and vomiting.  Neurological: Negative for dizziness.  Psychiatric/Behavioral: Positive for depression, substance abuse and suicidal ideas. Negative for hallucinations and memory loss. The patient is nervous/anxious and has insomnia.     Blood pressure 121/90, pulse 77, temperature 98 F (36.7 C), temperature source Oral, resp. rate 20, SpO2 100 %. There is no height or weight on file to calculate BMI.  Past Psychiatric History: Alcohol use disorder, cocaine induced mood disorder.   Is the patient at risk to self? Yes  Has the patient been a risk to self in the past 6 months? Yes .    Has the patient been a risk to self within the distant past? Yes   Is the patient a risk to others? No   Has the patient been a risk to others in the past 6 months? No   Has the patient been a risk to others within the distant past? No   Past Medical History:  Past Medical History:  Diagnosis Date  . Anxiety   . Chest pain   . Depression   . Pericarditis   . Schizophrenia (HCC)    History reviewed. No pertinent surgical history.  Family History: History reviewed. No pertinent family  history.  Social History:  Social History   Socioeconomic History  . Marital status: Legally Separated    Spouse name: Not on file  . Number of children: Not on file  . Years of education: Not on file  . Highest education level: Not on file  Occupational History  . Not on file  Tobacco Use  . Smoking status: Current Every Day Smoker    Types: Cigarettes  . Smokeless tobacco: Never Used  Substance and Sexual Activity  . Alcohol use: Yes  . Drug use: Yes    Types: Marijuana, Cocaine    Comment: UDS was clear  . Sexual activity: Not Currently  Other Topics Concern  . Not on file  Social History Narrative  . Not on file   Social Determinants of Health   Financial Resource Strain:   . Difficulty of Paying Living Expenses:   Food Insecurity:   . Worried About Programme researcher, broadcasting/film/video in the Last Year:   . Barista in the Last Year:   Transportation Needs:   . Freight forwarder (Medical):   [name] Lack of Transportation (Non-Medical):   Physical Activity:   . Days of Exercise per Week:   . Minutes of Exercise per Session:   Stress:   .  Feeling of Stress :   Social Connections:   . Frequency of Communication with Friends and Family:   . Frequency of Social Gatherings with Friends and Family:   . Attends Religious Services:   . Active Member of Clubs or Organizations:   . Attends Banker Meetings:   [name] Marital Status:   Intimate Partner Violence:   . Fear of Current or Ex-Partner:   . Emotionally Abused:   [name] Physically Abused:   . Sexually Abused:     SDOH:  SDOH Screenings   Alcohol Screen: Medium Risk  . Last Alcohol Screening Score (AUDIT): 15  Depression (PHQ2-9): Medium Risk  . PHQ-2 Score: 27  Financial Resource Strain:   . Difficulty of Paying Living Expenses:   Food Insecurity:   . Worried About Programme researcher, broadcasting/film/video in the Last Year:   . [employer] of Food in the Last Year:   Housing:   . Last Housing Risk Score:   Physical Activity:    . Days of Exercise per Week:   . Minutes of Exercise per Session:   Social Connections:   . Frequency of Communication with Friends and Family:   . Frequency of Social Gatherings with Friends and Family:   . Attends Religious Services:   . Active Member of Clubs or Organizations:   . Attends Banker Meetings:   [name] Marital Status:   Stress:   . Feeling of Stress :   Tobacco Use: High Risk  . Smoking Tobacco Use: Current Every Day Smoker  . Smokeless Tobacco Use: Never Used  Transportation Needs:   . Freight forwarder (Medical):   [name] Lack of Transportation (Non-Medical):     Last Labs:  Admission on 09/05/2019, Discharged on 09/05/2019  Component Date Value Ref Range Status  . SARS Coronavirus 2 09/05/2019 NEGATIVE  NEGATIVE Final   Comment: (NOTE) SARS-CoV-2 target nucleic acids are NOT DETECTED.  The SARS-CoV-2 RNA is generally detectable in upper and lower respiratory specimens during the acute phase of infection. The lowest concentration of SARS-CoV-2 viral copies this assay can detect is 250 copies / mL. A negative result does not preclude SARS-CoV-2 infection and should not be used as the sole basis for treatment or other patient management decisions.  A negative result may occur with improper specimen collection / handling, submission of specimen other than nasopharyngeal swab, presence of viral mutation(s) within the areas targeted by this assay, and inadequate number of viral copies (<250 copies / mL). A negative result must be combined with clinical observations, patient history, and epidemiological information.  Fact Sheet for Patients:   BoilerBrush.com.cy  Fact Sheet for Healthcare Providers: https://pope.com/  This test is not yet approved or                           cleared by the [place] FDA and has been authorized for detection and/or diagnosis of SARS-CoV-2 by FDA under an Emergency  Use Authorization (EUA).  This EUA will remain in effect (meaning this test can be used) for the duration of the COVID-19 declaration under Section 564(b)(1) of the Act, 21 U.S.C. section 360bbb-3(b)(1), unless the authorization is terminated or revoked sooner.  Performed at [facility], [address] [place], [place] [address]   . Sodium 09/05/2019 140  135 - 145 mmol/L Final  . Potassium 09/05/2019 4.2  3.5 - 5.1 mmol/L Final  . Chloride 09/05/2019 102  98 -  111 mmol/L Final  . CO2 09/05/2019 27  22 - 32 mmol/L Final  . Glucose, Bld 09/05/2019 90  70 - 99 mg/dL Final   Glucose reference range applies only to samples taken after fasting for at least 8 hours.  . BUN 09/05/2019 16  6 - 20 mg/dL Final  . Creatinine, Ser 09/05/2019 1.14  0.61 - 1.24 mg/dL Final  . Calcium 54/65/0354 9.0  8.9 - 10.3 mg/dL Final  . Total Protein 09/05/2019 7.2  6.5 - 8.1 g/dL Final  . Albumin 65/68/1275 4.6  3.5 - 5.0 g/dL Final  . AST 17/00/1749 26  15 - 41 U/L Final  . ALT 09/05/2019 24  0 - 44 U/L Final  . Alkaline Phosphatase 09/05/2019 51  38 - 126 U/L Final  . Total Bilirubin 09/05/2019 0.7  0.3 - 1.2 mg/dL Final  . GFR calc non Af Amer 09/05/2019 >60  >60 mL/min Final  . GFR calc Af Amer 09/05/2019 >60  >60 mL/min Final  . Anion gap 09/05/2019 11  5 - 15 Final   Performed at [facility], [address], [place], [place] [address]  . Salicylate Lvl 09/05/2019 <7.0* 7.0 - 30.0 mg/dL Final   Performed at [facility], [address], [place], [place] [address]  . Acetaminophen (Tylenol), Serum 09/05/2019 <10* 10 - 30 ug/mL Final   Comment: (NOTE) Therapeutic concentrations vary significantly. A range of 10-30 ug/mL  may be an effective concentration for many patients. However, some  are best treated at concentrations outside of this range. Acetaminophen concentrations >150 ug/mL at 4 hours after ingestion  and >50 ug/mL at 12 hours after  ingestion are often associated with  toxic reactions.  Performed at [facility], [address], [place], [place] [address]   . Alcohol, Ethyl (B) 09/05/2019 <10  <10 mg/dL Final   Comment: (NOTE) Lowest detectable limit for serum alcohol is 10 mg/dL.  For medical purposes only. Performed at [facility], [address] [place], [place] [address]   . Opiates 09/05/2019 NONE DETECTED  NONE DETECTED Final  . Cocaine 09/05/2019 POSITIVE* NONE DETECTED Final  . Benzodiazepines 09/05/2019 NONE DETECTED  NONE DETECTED Final  . Amphetamines 09/05/2019 NONE DETECTED  NONE DETECTED Final  . Tetrahydrocannabinol 09/05/2019 NONE DETECTED  NONE DETECTED Final  . Barbiturates 09/05/2019 NONE DETECTED  NONE DETECTED Final   Comment: (NOTE) DRUG SCREEN FOR MEDICAL PURPOSES ONLY.  IF CONFIRMATION IS NEEDED FOR ANY PURPOSE, NOTIFY LAB WITHIN 5 DAYS.  LOWEST DETECTABLE LIMITS FOR URINE DRUG SCREEN Drug Class                     Cutoff (ng/mL) Amphetamine and metabolites    1000 Barbiturate and metabolites    200 Benzodiazepine                 200 Tricyclics and metabolites     300 Opiates and metabolites        300 Cocaine and metabolites        300 THC                            50 Performed at [facility], [address] [place], [place] [address]   . WBC 09/05/2019 11.2* 4.0 - 10.5 K/uL Final  . RBC 09/05/2019 4.80  4.22 - 5.81 MIL/uL Final  . Hemoglobin 09/05/2019 16.0  13.0 -  17.0 g/dL Final  . HCT 16/11/9602 46.8  39 - 52 % Final  . MCV 09/05/2019 97.5  80.0 - 100.0 fL Final  . MCH 09/05/2019 33.3  26.0 - 34.0 pg Final  . MCHC 09/05/2019 34.2  30.0 - 36.0 g/dL Final  . RDW 54/10/8117 11.7  11.5 - 15.5 % Final  . Platelets 09/05/2019 177  150 - 400 K/uL Final  . nRBC 09/05/2019 0.0  0.0 - 0.2 % Final  . Neutrophils Relative % 09/05/2019 73  % Final  . Neutro Abs 09/05/2019 8.3* 1.7 - 7.7 K/uL Final  . Lymphocytes  Relative 09/05/2019 17  % Final  . Lymphs Abs 09/05/2019 1.9  0.7 - 4.0 K/uL Final  . Monocytes Relative 09/05/2019 7  % Final  . Monocytes Absolute 09/05/2019 0.7  0 - 1 K/uL Final  . Eosinophils Relative 09/05/2019 2  % Final  . Eosinophils Absolute 09/05/2019 0.2  0 - 0 K/uL Final  . Basophils Relative 09/05/2019 1  % Final  . Basophils Absolute 09/05/2019 0.1  0 - 0 K/uL Final  . Immature Granulocytes 09/05/2019 0  % Final  . Abs Immature Granulocytes 09/05/2019 0.05  0.00 - 0.07 K/uL Final   Performed at [facility], [address] [place], [place] [address]  . Color, Urine 09/05/2019 YELLOW  YELLOW Final  . APPearance 09/05/2019 CLEAR  CLEAR Final  . Specific Gravity, Urine 09/05/2019 1.030  1.005 - 1.030 Final  . pH 09/05/2019 5.0  5.0 - 8.0 Final  . Glucose, UA 09/05/2019 NEGATIVE  NEGATIVE mg/dL Final  . Hgb urine dipstick 09/05/2019 NEGATIVE  NEGATIVE Final  . Bilirubin Urine 09/05/2019 NEGATIVE  NEGATIVE Final  . Ketones, ur 09/05/2019 NEGATIVE  NEGATIVE mg/dL Final  . Protein, ur 95/62/1308 30* NEGATIVE mg/dL Final  . Nitrite 65/78/4696 NEGATIVE  NEGATIVE Final  . Glori Luis 09/05/2019 NEGATIVE  NEGATIVE Final  . RBC / HPF 09/05/2019 0-5  0 - 5 RBC/hpf Final  . WBC, UA 09/05/2019 0-5  0 - 5 WBC/hpf Final  . Bacteria, UA 09/05/2019 NONE SEEN  NONE SEEN Final  . Squamous Epithelial / LPF 09/05/2019 0-5  0 - 5 Final  . Mucus 09/05/2019 PRESENT   Final  . Hyaline Casts, UA 09/05/2019 PRESENT   Final   Performed at [facility], [address], [place], [place] [address]  Admission on 08/23/2019, Discharged on 08/29/2019  Component Date Value Ref Range Status  . Hgb A1c MFr Bld 08/24/2019 5.0  4.8 - 5.6 % Final   Comment: (NOTE) Pre diabetes:          5.7%-6.4%  Diabetes:              >6.4%  Glycemic control for   <7.0% adults with diabetes   . Mean Plasma Glucose 08/24/2019 96.8  mg/dL Final   Performed at [facility], [address] [place], [place] [address]  . Cholesterol 08/24/2019 232* 0 - 200 mg/dL Final  . Triglycerides 08/24/2019 78  <150 mg/dL Final  . HDL 40/11/2723 68  >40 mg/dL Final  . Total CHOL/HDL Ratio 08/24/2019 3.4  RATIO Final  . VLDL 08/24/2019 16  0 - 40 mg/dL Final  . LDL Cholesterol 08/24/2019 148* 0 - 99 mg/dL Final   Comment:        Total Cholesterol/HDL:CHD Risk Coronary Heart Disease Risk Table  Men   Women  1/2 Average Risk   3.4   3.3  Average Risk       5.0   4.4  2 X Average Risk   9.6   7.1  3 X Average Risk  23.4   11.0        Use the calculated Patient Ratio above and the CHD Risk Table to determine the patient's CHD Risk.        ATP III CLASSIFICATION (LDL):  <100     mg/dL   Optimal  [id number]  mg/dL   Near or Above                    Optimal  130-159  mg/dL   Borderline  [id number]  mg/dL   High  >811>190     mg/dL   Very High Performed at [facility], [address] [place], [place] [address]   . TSH 08/24/2019 1.686  0.350 - 4.500 uIU/mL Final   Comment: Performed by a 3rd Generation assay with a functional sensitivity of <=0.01 uIU/mL. Performed at [facility], [address] [place], [place] [address]   Admission on 08/22/2019, Discharged on 08/23/2019  Component Date Value Ref Range Status  . Sodium 08/22/2019 142  135 - 145 mmol/L Final  . Potassium 08/22/2019 3.6  3.5 - 5.1 mmol/L Final  . Chloride 08/22/2019 104  98 - 111 mmol/L Final  . CO2 08/22/2019 25  22 - 32 mmol/L Final  . Glucose, Bld 08/22/2019 90  70 - 99 mg/dL Final   Glucose reference range applies only to samples taken after fasting for at least 8 hours.  . BUN 08/22/2019 11  6 - 20 mg/dL Final  . Creatinine, Ser 08/22/2019 0.91  0.61 - 1.24 mg/dL Final  . Calcium 13/08/657806/26/2021 9.3  8.9 - 10.3 mg/dL Final  . Total Protein 08/22/2019 7.6  6.5 - 8.1 g/dL Final  . Albumin 46/96/295206/26/2021 4.9  3.5 - 5.0 g/dL Final  . AST 84/13/244006/26/2021 35   15 - 41 U/L Final  . ALT 08/22/2019 36  0 - 44 U/L Final  . Alkaline Phosphatase 08/22/2019 59  38 - 126 U/L Final  . Total Bilirubin 08/22/2019 0.7  0.3 - 1.2 mg/dL Final  . GFR calc non Af Amer 08/22/2019 >60  >60 mL/min Final  . GFR calc Af Amer 08/22/2019 >60  >60 mL/min Final  . Anion gap 08/22/2019 13  5 - 15 Final   Performed at [facility], [address] [place], [place] [address]  . Alcohol, Ethyl (B) 08/22/2019 <10  <10 mg/dL Final   Comment: (NOTE) Lowest detectable limit for serum alcohol is 10 mg/dL.  For medical purposes only. Performed at [facility], [address] [place], [place] [address]   . Salicylate Lvl 08/22/2019 <7.0* 7.0 - 30.0 mg/dL Final   Performed at [facility], [address] [place], [place] [address]  . Acetaminophen (Tylenol), Serum 08/22/2019 <10* 10 - 30 ug/mL Final   Comment: (NOTE) Therapeutic concentrations vary significantly. A range of 10-30 ug/mL  may be an effective concentration for many patients. However, some  are best treated at concentrations outside of this range. Acetaminophen concentrations >150 ug/mL at 4 hours after ingestion  and >50 ug/mL at 12 hours after ingestion are often associated with  toxic reactions.  Performed at [facility], [address] [place], [place] [address]   .  WBC 08/22/2019 10.6* 4.0 - 10.5 K/uL Final  . RBC 08/22/2019 4.82  4.22 - 5.81 MIL/uL Final  . Hemoglobin 08/22/2019 16.4  13.0 - 17.0 g/dL Final  . HCT 16/11/9602 47.9  39 - 52 % Final  . MCV 08/22/2019 99.4  80.0 - 100.0 fL Final  . MCH 08/22/2019 34.0  26.0 - 34.0 pg Final  . MCHC 08/22/2019 34.2  30.0 - 36.0 g/dL Final  . RDW 54/10/8117 11.9  11.5 - 15.5 % Final  . Platelets 08/22/2019 191  150 - 400 K/uL Final  . nRBC 08/22/2019 0.0  0.0 - 0.2 % Final   Performed at [facility], [address] [place], [place] [address]  . Opiates  08/22/2019 NONE DETECTED  NONE DETECTED Final  . Cocaine 08/22/2019 NONE DETECTED  NONE DETECTED Final  . Benzodiazepines 08/22/2019 NONE DETECTED  NONE DETECTED Final  . Amphetamines 08/22/2019 NONE DETECTED  NONE DETECTED Final  . Tetrahydrocannabinol 08/22/2019 NONE DETECTED  NONE DETECTED Final  . Barbiturates 08/22/2019 NONE DETECTED  NONE DETECTED Final   Comment: (NOTE) DRUG SCREEN FOR MEDICAL PURPOSES ONLY.  IF CONFIRMATION IS NEEDED FOR ANY PURPOSE, NOTIFY LAB WITHIN 5 DAYS.  LOWEST DETECTABLE LIMITS FOR URINE DRUG SCREEN Drug Class                     Cutoff (ng/mL) Amphetamine and metabolites    1000 Barbiturate and metabolites    200 Benzodiazepine                 200 Tricyclics and metabolites     300 Opiates and metabolites        300 Cocaine and metabolites        300 THC                            50 Performed at [facility], [address], [place], [place] [address]   . SARS Coronavirus 2 08/22/2019 NEGATIVE  NEGATIVE Final   Comment: (NOTE) SARS-CoV-2 target nucleic acids are NOT DETECTED.  The SARS-CoV-2 RNA is generally detectable in upper and lower respiratory specimens during the acute phase of infection. The lowest concentration of SARS-CoV-2 viral copies this assay can detect is 250 copies / mL. A negative result does not preclude SARS-CoV-2 infection and should not be used as the sole basis for treatment or other patient management decisions.  A negative result may occur with improper specimen collection / handling, submission of specimen other than nasopharyngeal swab, presence of viral mutation(s) within the areas targeted by this assay, and inadequate number of viral copies (<250 copies / mL). A negative result must be combined with clinical observations, patient history, and epidemiological information.  Fact Sheet for Patients:   BoilerBrush.com.cy  Fact Sheet for Healthcare  Providers: https://pope.com/  This test is not yet approved or                           cleared by the [place] FDA and has been authorized for detection and/or diagnosis of SARS-CoV-2 by FDA under an Emergency Use Authorization (EUA).  This EUA will remain in effect (meaning this test can be used) for the duration of the COVID-19 declaration under Section 564(b)(1) of the Act, 21 U.S.C. section 360bbb-3(b)(1), unless the authorization is terminated or revoked sooner.  Performed at [facility], [address]  [place], [place] [address]     Allergies: Patient has no known allergies.  PTA Medications: (Not in a hospital admission)   Medical Decision Making  Patient was medically cleared in the emergency department.   Resume home medications  Seroquel 100 mg QHS for sleep and mood stability Lamictal 50 mg daily for mood stability Hydroxyzine every 6 hours prn for anxiety Paxil 10 mg daily for depression  Recommendations  Based on my evaluation the patient does not appear to have an emergency medical condition.   Patient will be placed in the continuous assessment area at [facility] for treatment and stabilization. He will be reevaluated on 09/06/2019.The treatment team will determine disposition at that time.      Jackelyn Poling, NP 09/06/19  1:22 AM

## 2019-09-05 NOTE — BH Assessment (Signed)
Comprehensive Clinical Assessment (CCA) Note  09/05/2019 Chris Olson [id number] -Clinician reviewed note by Dr. Jeraldine Loots.  29 year-old male with history of depression, cocaine use, now presents with suicidal ideation. He notes that since his recent hospitalization he has had worsening thoughts of suicidal ideation.  He states that today, with increasing thoughts of killing himself, planning to use a ceiling fan to hang himself he sought help. He notes that since his hospitalization he has used cocaine, alcohol, though none today.  No current physical pain, complaints.  He states that he has been taking his medication as directed.  Pt says that his wife called [employer] to bring him to hospital.  He told her that he was having thoughts of killing himself.  He said he was looking at the ceiling fan and thinking of hanging himself.  So also thoughts of overdosing on pills.  Pt has no previous hx of suicide attempt.  He was recently at [facility] and was discharged on 08/29/19.  Patient denies any HI or A/V hallucinations.  He admits to drinking ETOH every day.  He says that he last drank yesterday (07/09) and he usually drinks three or four 40's.  Pt says he has a decreased appetite and will drink instead of eat.  Patient uses cocaine about twice in a month he says.  Patient says he and wife are going through a divorce.  He has a girlfriend and that relationship ended recently.  Patient says he has let down his family and friends and feels that they do not want him around anymore.    Pt has a flat affect, he is oriented x4.  He is not responding to internal stimuli.  Patient is not engaged in delusional thought process.  His thoughts are linear, clear and coherent.  He says he has some trouble sleeping even with the medication that he takes.  He says that he wished he had gone to [facility] for rehab as had been recommended for him prior.  Pt wants inpatient care and wants to go to a rehab facility  also.  -Clinician discussed patient care with Nira Conn, FNP.  He recommended bringing patient to [facility] for continuous assessment overnight.    Visit Diagnosis:      ICD-10-CM   1. Suicidal ideation  R45.851       CCA Screening, Triage and Referral (STR)  Patient Reported Information How did you hear about [place]? Family/Friend  Referral name: Wife called [employer] due to his SI  Referral phone number: No data recorded  Whom do you see for routine medical problems? I don't have a doctor  Practice/Facility Name: No data recorded Practice/Facility Phone Number: No data recorded Name of Contact: No data recorded Contact Number: No data recorded Contact Fax Number: No data recorded Prescriber Name: No data recorded Prescriber Address (if known): No data recorded  What Is the Reason for Your Visit/Call Today? Pt is having thoughts of hanging himself or overdosing on pills  How Long Has This Been Causing You Problems? 1 wk - 1 month  What Do You Feel Would Help You the Most Today? Other (Comment) (Pt wants inpatient tx.)   Have You Recently Been in Any Inpatient Treatment (Hospital/Detox/Crisis Center/28-Day Program)? Yes  Name/Location of Program/Hospital:[facility]  How Long Were You There? June 27-August 29, 2019  When Were You Discharged? 08/29/19   Have You Ever Received Services From [employer] Before? Yes  Who Do You See at [facility]? [facility]   Have  You Recently Had Any Thoughts About Hurting Yourself? Yes  Are You Planning to Commit Suicide/Harm Yourself At This time? Yes (Thoughts of hanging self or overdosing on pills.)   Have you Recently Had Thoughts About Hurting Someone Karolee Ohs? No  Explanation: No data recorded  Have You Used Any Alcohol or Drugs in the Past 24 Hours? No (Did drink ETOH yesterday and cocaine prior to that.)  How Long Ago Did You Use Drugs or Alcohol? 1000  What Did You Use and How Much? Two 40-ounce beers   Do You Currently Have a  Therapist/Psychiatrist? Yes  Name of Therapist/Psychiatrist: [facility].  Missed last appt due to being inpatient at [facility].   Have You Been Recently Discharged From Any Office Practice or Programs? No  Explanation of Discharge From Practice/Program: No data recorded    CCA Screening Triage Referral Assessment Type of Contact: Tele-Assessment  Is this Initial or Reassessment? Initial Assessment  Date Telepsych consult ordered in [facility]:  09/05/19  Time Telepsych consult ordered in [facility]:  1730   Patient Reported Information Reviewed? Yes  Patient Left Without Being Seen? No data recorded Reason for Not Completing Assessment: No data recorded  Collateral Involvement: None   Does Patient Have a Court Appointed Legal Guardian? No data recorded Name and Contact of Legal Guardian: No data recorded If Minor and Not Living with Parent(s), Who has Custody? No data recorded Is CPS involved or ever been involved? Never  Is APS involved or ever been involved? Never   Patient Determined To Be At Risk for Harm To Self or Others Based on Review of Patient Reported Information or Presenting Complaint? Yes, for Self-Harm  Method: No data recorded Availability of Means: No data recorded Intent: No data recorded Notification Required: No data recorded Additional Information for Danger to Others Potential: No data recorded Additional Comments for Danger to Others Potential: No data recorded Are There Guns or Other Weapons in Your Home? No data recorded Types of Guns/Weapons: No data recorded Are These Weapons Safely Secured?                            No data recorded Who Could Verify You Are Able To Have These Secured: No data recorded Do You Have any Outstanding Charges, Pending Court Dates, Parole/Probation? No data recorded Contacted To Inform of Risk of Harm To Self or Others: Unable to Contact:   Location of Assessment: [facility]   Does Patient Present under Involuntary Commitment?  No  IVC Papers Initial File Date: 08/22/19   [place] of Residence: [place]   Patient Currently Receiving the Following Services: Individual Therapy;Medication Management   Determination of Need: Emergent (2 hours)   Options For Referral: Inpatient Hospitalization     CCA Biopsychosocial  Intake/Chief Complaint:     Mental Health Symptoms Depression:     Mania:     Anxiety:      Psychosis:     Trauma:     Obsessions:     Compulsions:     Inattention:     Hyperactivity/Impulsivity:     Oppositional/Defiant Behaviors:     Emotional Irregularity:     Other Mood/Personality Symptoms:      Mental Status Exam Appearance and self-care  Stature:     Weight:     Clothing:     Grooming:     Cosmetic use:     Posture/gait:     Motor activity:     Sensorium  Attention:     Concentration:     Orientation:     Recall/memory:     Affect and Mood  Affect:     Mood:     Relating  Eye contact:     Facial expression:     Attitude toward examiner:     Thought and Language  Speech flow:    Thought content:     Preoccupation:     Hallucinations:     Organization:     Company secretaryxecutive Functions  Fund of Knowledge:     Intelligence:     Abstraction:     Judgement:     Reality Testing:     Insight:     Decision Making:     Social Functioning  Social Maturity:     Social Judgement:     Stress  Stressors:     Coping Ability:     Skill Deficits:     Supports:        Religion:    Leisure/Recreation:    Exercise/Diet:     CCA Employment/Education  Employment/Work Situation:    Education:     CCA Family/Childhood History  Family and Relationship History:    Childhood History:     Child/Adolescent Assessment:     CCA Substance Use  Alcohol/Drug Use: Alcohol / Drug Use Pain Medications: See PTA medication list [facility]criptions: [facility]. Over the Counter: See PTA medication list. History of alcohol / drug use?:  Yes Withdrawal Symptoms: Patient aware of relationship between substance abuse and physical/medical complications, Nausea / Vomiting Substance #1 Name of Substance 1: ETOH 1 - Age of First Use: 29 years of age 44 - Amount (size/oz): Three to four 40's per day 1 - Frequency: Daily 1 - Duration: ongoing 1 - Last Use / Amount: 09/04/19.  Drank four 40's Substance #2 Name of Substance 2: Cocaine 2 - Age of First Use: 15 2 - Amount (size/oz): Varies, ususally a gram or so. 2 - Frequency: Twice in a month 2 - Duration: ongoing 2 - Last Use / Amount: Yesterday (07/09).                     ASAM's:  Six Dimensions of Multidimensional Assessment  Dimension 1:  Acute Intoxication and/or Withdrawal Potential:      Dimension 2:  Biomedical Conditions and Complications:      Dimension 3:  Emotional, Behavioral, or Cognitive Conditions and Complications:     Dimension 4:  Readiness to Change:     Dimension 5:  Relapse, Continued use, or Continued Problem Potential:     Dimension 6:  Recovery/Living Environment:     ASAM Severity Score:    ASAM Recommended Level of Treatment:     Substance use Disorder (SUD)    Recommendations for Services/Supports/Treatments:    DSM5 Diagnoses: Patient Active Problem List   Diagnosis Date Noted  . Severe recurrent major depression without psychotic features (HCC) 08/23/2019  . Cocaine abuse with cocaine-induced mood disorder (HCC) 11/11/2017    Patient Centered Plan: Patient is on the following Treatment Plan(s):  Depression and Substance Abuse   Referrals to Alternative Service(s): Referred to Alternative Service(s):   Place:   Date:   Time:    Referred to Alternative Service(s):   Place:   Date:   Time:    Referred to Alternative Service(s):   Place:   Date:   Time:    Referred to Alternative Service(s):   Place:  Date:   Time:     [place]

## 2019-09-05 NOTE — ED Notes (Signed)
Pt presents with complaint of Depression, SI and cocaine abuse.  Pt reports he and wife are going through a divorce and he recently broke up with his girlfriend.  Denies HI or AVH, admits to drinking everyday and abusing cocaine about twice a month.  Skin search completed, Pt calm & cooperative, monitoring for safety.

## 2019-09-05 NOTE — ED Triage Notes (Signed)
[facility] transfer presents with SI & Depression.

## 2019-09-05 NOTE — BH Assessment (Signed)
Clinician spoke with Dr. Jeraldine Loots about patient.  Informed him that patient could be transferred to [facility] per Nira Conn, FNP.  Dr. Jeraldine Loots said he would make that happen.

## 2019-09-05 NOTE — ED Provider Notes (Signed)
[facility]-EMERGENCY DEPT Provider Note   CSN: [id number] Arrival date & time: 09/05/19  1710     History No chief complaint on file.   Chris Olson. is a 29 y.o. male.  HPI    29 year-old male with history of depression, cocaine use, now presents with suicidal ideation. He notes that since his recent hospitalization he has had worsening thoughts of suicidal ideation.  He states that today, with increasing thoughts of killing himself, planning to use a ceiling fan to hang himself he sought help. He notes that since his hospitalization he has used cocaine, alcohol, though none today.  No current physical pain, complaints.  He states that he has been taking his medication as directed.  Additional details via chart review, notable for the patient having recent hospitalization at behavioral health for similar suicidal ideation. Past Medical History:  Diagnosis Date  . Anxiety   . Chest pain   . Depression   . Pericarditis   . Schizophrenia [facility])     Patient Active Problem List   Diagnosis Date Noted  . Severe recurrent major depression without psychotic features (HCC) 08/23/2019  . Cocaine abuse with cocaine-induced mood disorder (HCC) 11/11/2017    No past surgical history on file.     No family history on file.  Social History   Tobacco Use  . Smoking status: Current Every Day Smoker    Types: Cigarettes  . Smokeless tobacco: Never Used  Substance Use Topics  . Alcohol use: Yes    Comment: social use -- BAC was clear  . Drug use: Yes    Types: Marijuana, Cocaine    Comment: UDS was clear    Home Medications Prior to Admission medications   Medication Sig Start Date End Date Taking? Authorizing Provider  hydrOXYzine (ATARAX/VISTARIL) 25 MG tablet Take 1 tablet (25 mg total) by mouth every 6 (six) hours as needed for anxiety. 08/29/19   Armandina Stammer I, NP  lamoTRIgine (LAMICTAL) 25 MG tablet Take 2 tablets (50 mg total) by mouth daily.  For mood stabilization 08/29/19   Armandina Stammer I, NP  nicotine polacrilex (NICORETTE) 2 MG gum Take 1 each (2 mg total) by mouth as needed. (May buy from over the counter): For smoking cessation. 08/29/19   Armandina Stammer I, NP  PARoxetine (PAXIL) 10 MG tablet Take 1 tablet (10 mg total) by mouth daily. For depression 08/30/19   Armandina Stammer I, NP  QUEtiapine (SEROQUEL) 100 MG tablet Take 1 tablet (100 mg total) by mouth at bedtime. For mood control 08/29/19   Sanjuana Kava, NP    Allergies    Patient has no known allergies.  Review of Systems   Review of Systems  Constitutional:       Per HPI, otherwise negative  HENT:       Per HPI, otherwise negative  Respiratory:       Per HPI, otherwise negative  Cardiovascular:       Per HPI, otherwise negative  Gastrointestinal: Negative for vomiting.  Endocrine:       Negative aside from HPI  Genitourinary:       Neg aside from HPI   Musculoskeletal:       Per HPI, otherwise negative  Skin: Negative.   Neurological: Negative for syncope.  Psychiatric/Behavioral: Positive for dysphoric mood, sleep disturbance and suicidal ideas.    Physical Exam Updated Vital Signs BP 127/80 (BP Location: Left Arm)   Pulse 77   Temp 99.3  F (37.4 C) (Oral)   Resp 16   SpO2 99%   Physical Exam Vitals and nursing note reviewed.  Constitutional:      General: He is not in acute distress.    Appearance: He is well-developed.  HENT:     Head: Normocephalic and atraumatic.  Eyes:     Conjunctiva/sclera: Conjunctivae normal.  Pulmonary:     Effort: Pulmonary effort is normal. No respiratory distress.     Breath sounds: No stridor.  Abdominal:     General: There is no distension.  Skin:    General: Skin is warm and dry.  Neurological:     Mental Status: He is alert and oriented to person, place, and time.  Psychiatric:        Behavior: Behavior is slowed.        Thought Content: Thought content includes suicidal ideation. Thought content includes  suicidal plan.     ED Results / Procedures / Treatments   Labs (all labs ordered are listed, but only abnormal results are displayed) Labs Reviewed  SARS CORONAVIRUS 2 BY RT PCR (HOSPITAL ORDER, PERFORMED IN [facility])  COMPREHENSIVE METABOLIC PANEL  SALICYLATE LEVEL  ACETAMINOPHEN LEVEL  ETHANOL  RAPID URINE DRUG SCREEN, HOSP PERFORMED  CBC WITH DIFFERENTIAL/PLATELET  URINALYSIS, ROUTINE W REFLEX MICROSCOPIC    EKG None  Radiology No results found.  Procedures Procedures (including critical care time)  Medications Ordered in ED Medications  acetaminophen (TYLENOL) tablet 650 mg (has no administration in time range)    ED Course  I have reviewed the triage vital signs and the nursing notes.  Pertinent labs & imaging results that were available during my care of the patient were reviewed by me and considered in my medical decision making (see chart for details).   This patient presents with the need for medical evaluation due to ongoing psychiatric condition.  The patient's medical portion of the evaluation is generally reassuring, with no evidence of acute new pathology.  The patient has been medically cleared for further psychiatric evaluation.   8:55 PM Patient accepted for transfer to our behavioral health center. Final Clinical Impression(s) / ED Diagnoses Final diagnoses:  Suicidal ideation     Gerhard Munch, MD 09/05/19 2056

## 2019-09-06 ENCOUNTER — Encounter (HOSPITAL_COMMUNITY): Payer: Self-pay | Admitting: Emergency Medicine

## 2019-09-06 ENCOUNTER — Other Ambulatory Visit: Payer: Self-pay

## 2019-09-06 MED ORDER — PAROXETINE HCL 10 MG PO TABS
10.0000 mg | ORAL_TABLET | Freq: Once | ORAL | Status: AC
  Administered 2019-09-06: 10 mg via ORAL
  Filled 2019-09-06: qty 1

## 2019-09-06 MED ORDER — MAGNESIUM HYDROXIDE 400 MG/5ML PO SUSP: 30.0000 mL | Freq: Every day | ORAL | Status: DC | PRN

## 2019-09-06 MED ORDER — ALUM & MAG HYDROXIDE-SIMETH 200-200-20 MG/5ML PO SUSP: 30.0000 mL | ORAL | Status: DC | PRN

## 2019-09-06 MED ORDER — PAROXETINE HCL 10 MG PO TABS
10.0000 mg | ORAL_TABLET | Freq: Every day | ORAL | Status: DC
  Administered 2019-09-06: 10 mg via ORAL
  Filled 2019-09-06: qty 1

## 2019-09-06 MED ORDER — HYDROXYZINE HCL 25 MG PO TABS
25.0000 mg | ORAL_TABLET | Freq: Four times a day (QID) | ORAL | Status: DC | PRN
  Administered 2019-09-06: 25 mg via ORAL
  Filled 2019-09-06: qty 1

## 2019-09-06 MED ORDER — PAROXETINE HCL 20 MG PO TABS: 20.0000 mg | ORAL_TABLET | Freq: Every day | ORAL | Status: DC

## 2019-09-06 MED ORDER — PAROXETINE HCL 20 MG PO TABS: 20.0000 mg | ORAL_TABLET | Freq: Every day | ORAL | 0 refills | Status: DC

## 2019-09-06 MED ORDER — QUETIAPINE FUMARATE 100 MG PO TABS: 100.0000 mg | ORAL_TABLET | Freq: Every day | ORAL | Status: DC

## 2019-09-06 MED ORDER — LAMOTRIGINE 25 MG PO TABS
50.0000 mg | ORAL_TABLET | Freq: Every day | ORAL | Status: DC
  Administered 2019-09-06: 50 mg via ORAL
  Filled 2019-09-06: qty 2

## 2019-09-06 MED ORDER — ACETAMINOPHEN 325 MG PO TABS: 650.0000 mg | ORAL_TABLET | Freq: Four times a day (QID) | ORAL | Status: DC | PRN

## 2019-09-06 NOTE — Discharge Instructions (Addendum)
Pt provided with the following resources per his request for outpatient and residential substance abuse options:    Residential: [facility] day residential substance abuse facility (not an option if you have active assault charges). [address], [place], [place] [address] Phone: [phone]: Ask for Drema Pry in admissions to complete intake if interested in pursuing this option.  Residential: [facility]: Can get intake scheduled; (not an option if you have active assault charges). [address] [place], [place] [phone]) Call Mon-Fri.  Outpatient: [facility] (offers outpatient therapy and intensive outpatient substance abuse therapy).  [address], [place], [place] [address] Phone: [phone]  Outpatient: [facility]: Offers FREE recovery skills classes, support groups, 1:1 Peer Support, and Compeer Classes. [address] [place], [place] [address] Phone: [phone] (Call to complete intake).   Residential: [facility] [address] [place], [place] [address] Phone: [phone] ext [phone]  The [facility] provides food, shelter and other programs and services to the homeless men of [place] through our [employer] program.  By offering safe shelter, three meals a day, clean clothing, Biblical counseling, financial planning, vocational training, GED/education and employment assistance, we've helped mend the shattered lives of many homeless men since opening in 1974.  We have approximately 267 beds available, with a max of 312 beds including mats for emergency situations and currently house an average of 270 men a night.  Prospective Client Check-In Information Photo ID Required (State/ Out of State/ [facility]) - if photo ID is not available, clients are required to have a printout of a police/sheriff's criminal history report. Help out with chores around the [facility]. No sex  offender of any type (pending, charged, registered and/or any other sex related offenses) will be permitted to check in. Must be willing to abide by all rules, regulations, and policies established by the [employer]. The following will be provided - shelter, food, clothing, and biblical counseling. If you or someone you know is in need of assistance at our [facility] shelter in [place], [place], please call [phone] ext. [phone].

## 2019-09-06 NOTE — ED Notes (Signed)
Pt sleeping at present, no distress noted, calm & cooperative.  Monitoring for safety. 

## 2019-09-06 NOTE — ED Notes (Signed)
Pt  asking for RN and Provider to speak to his wife via telephone. Verbal consent from pt given to RN and Provider.

## 2019-09-06 NOTE — Progress Notes (Signed)
Pt provided with the following resources per his request for outpatient and residential substance abuse options:    Residential: [facility] day residential substance abuse facility (not an option if you have active assault charges). [address] [place], [place] [address] Phone: [phone]: Ask for Drema Pry in admissions to complete intake if interested in pursuing this option.  Residential: [facility]: Can get intake scheduled; (not an option if you have active assault charges). [address] [place], [place] [phone]) Call Mon-Fri.  Outpatient: [facility] (offers outpatient therapy and intensive outpatient substance abuse therapy).  [address] [place], [place] [address] Phone: [phone]  Outpatient: [facility]: Offers FREE recovery skills classes, support groups, 1:1 Peer Support, and Compeer Classes. [address], [place], [place] [address] Phone: [phone] (Call to complete intake).   Residential: [facility] [address] [place], [place] [address] Phone: [phone] ext [phone]  The [facility] provides food, shelter and other programs and services to the homeless men of [place] through our [employer] program.  By offering safe shelter, three meals a day, clean clothing, Biblical counseling, financial planning, vocational training, GED/education and employment assistance, we've helped mend the shattered lives of many homeless men since opening in 1974.  We have approximately 267 beds available, with a max of 312 beds including mats for emergency situations and currently house an average of 270 men a night.  Prospective Client Check-In Information Photo ID Required (State/ Out of State/ [facility]) - if photo ID is not available, clients are required to have a printout of a police/sheriff's criminal history report. Help out with chores around the [facility]. No sex  offender of any type (pending, charged, registered and/or any other sex related offenses) will be permitted to check in. Must be willing to abide by all rules, regulations, and policies established by the [employer]. The following will be provided - shelter, food, clothing, and biblical counseling. If you or someone you know is in need of assistance at our [facility] shelter in [place], [place], please call [phone] ext. [phone].   Justine Cossin S. Alan Ripper, MSW, LCSW Clinical Social Worker 09/06/2019 11:47 AM

## 2019-09-06 NOTE — ED Notes (Addendum)
Pt alert and oriented on the unit. Education, support, and encouragement provided. Discharge summary, medications and follow up appointments reviewed with pt. Suicide prevention resources provided. Pt's belongings (suitcase and duffel bag) returned. Pt denies SI/HI, A/VH, pain, or any concerns at this time. Pt ambulatory on and off unit. Pt discharged to lobby.

## 2019-09-06 NOTE — ED Provider Notes (Signed)
FBC/OBS ASAP Discharge Summary  Date and Time: 09/06/2019 11:45 AM  Name: Chris Olson.  MRN:  [id number]   Discharge Diagnoses:  Final diagnoses:  Severe episode of recurrent major depressive disorder, without psychotic features [facility])   Stay Summary: From admission H&P: Patient presented to the emergency department with suicidal ideations with thoughts of overdosing or hanging himself from a ceiling fan. He was transferred to [facility] for continuous assessment. Patient was recently hospitalized at behavioral health hospital. He was discharged on 08/29/19.  He reports after discharge that he continued to use cocaine and alcohol; last use of alcohol and cocaine was yesterday. He reports that he drinks three or four 40-ounce beers per day. He uses cocaine about twice per month. His urine drug screen was positive for cocaine, otherwise negative.  He states that he has been taking his medication. He reports that he is having suicidal thoughts with plans of overdosing or hanging himself. He has no history of suicide attempt. He would like assistance with placement at [facility] for substance abuse treatment. On evaluation, patient is alert and oriented x 4, pleasant and cooperative. Speech is clear and coherent. He reports his mood as depressed and anxious. Affect is congruent with mood. He continues to report suicidal thoughts. He denies homicidal thoughts. He denies auditory and visual hallucinations. He does not appear to be responding to internal stimuli. No evidence of delusional thought content.   Patient presented to the ED for SI and was transferred to [facility] for continuous observation. He was monitored on the observation unit overnight. Paxil was increased. Lamictal, Seroquel, and Vistaril were continued. Patient is familiar to this writer from recent admission to [facility] 08/23/19-08/29/19. He presented for similar complaints at that time and declined inpatient rehab, stating preference to discharge  home. He was upset about the boarding house where he was staying as well as relationship problems with his girlfriend.   On assessment today, patient reports continuing depression but denies SI. He reports relapsing on alcohol and cocaine after hospitalization and realizes now that he needs to stay away from substances. He is future-oriented, expressing desire to follow up with rehab as well as outpatient treatment so that he can "do better for myself, not throw my life away," and be a better father. We reviewed importance of following up with scheduled therapy and medication management appointments for continuing improvement, as patient has just recently undergone therapy and medication management in our inpatient setting. He is agreeable to increasing dose of Paxil, with the understanding that he needs to avoid alcohol/substance use for psychotropic medications to assist with his mood. He denies any HI/AVH. He shows no signs of responding to internal stimuli. He has been calm and cooperative during observation, with no agitated or disruptive behaviors or visible signs of mood instability.  With patient's expressed consent, I spoke with his wife Cala Bradford [phone]). His wife expressed concern about patient's recent suicidal ideation. She is asking for [place] to hold the patient in the hospital until he can be sent directly to rehab. I explained that patient does not currently meet criteria for inpatient hospitalization as he is not currently a risk to himself or others and is denying SI. We reviewed importance of patient following up with outpatient appointments as well as referrals to rehab for treatment, and she states understanding.  Total Time spent with patient: 30 minutes  Past Psychiatric History: Alcohol use disorder, cocaine induced mood disorder. Past Medical History:  Past Medical History:  Diagnosis Date  .  Anxiety   . Chest pain   . Depression   . Pericarditis   . Schizophrenia (HCC)     History reviewed. No pertinent surgical history. Family History: History reviewed. No pertinent family history. Family Psychiatric History: Denies Social History:  Social History   Substance and Sexual Activity  Alcohol Use Yes     Social History   Substance and Sexual Activity  Drug Use Yes  . Types: Marijuana, Cocaine   Comment: UDS was clear    Social History   Socioeconomic History  . Marital status: Legally Separated    Spouse name: Not on file  . Number of children: Not on file  . Years of education: Not on file  . Highest education level: Not on file  Occupational History  . Not on file  Tobacco Use  . Smoking status: Current Every Day Smoker    Types: Cigarettes  . Smokeless tobacco: Never Used  Substance and Sexual Activity  . Alcohol use: Yes  . Drug use: Yes    Types: Marijuana, Cocaine    Comment: UDS was clear  . Sexual activity: Not Currently  Other Topics Concern  . Not on file  Social History Narrative  . Not on file   Social Determinants of Health   Financial Resource Strain:   . Difficulty of Paying Living Expenses:   Food Insecurity:   . Worried About Programme researcher, broadcasting/film/videounning Out of Food in the Last Year:   . Baristaan Out of Food in the Last Year:   Transportation Needs:   . Freight forwarderLack of Transportation (Medical):   [name]. Lack of Transportation (Non-Medical):   Physical Activity:   . Days of Exercise per Week:   . Minutes of Exercise per Session:   Stress:   . Feeling of Stress :   Social Connections:   . Frequency of Communication with Friends and Family:   . Frequency of Social Gatherings with Friends and Family:   . Attends Religious Services:   . Active Member of Clubs or Organizations:   . Attends BankerClub or Organization Meetings:   [name]. Marital Status:    SDOH:  SDOH Screenings   Alcohol Screen: Medium Risk  . Last Alcohol Screening Score (AUDIT): 15  Depression (PHQ2-9): Medium Risk  . PHQ-2 Score: 27  Financial Resource Strain:   . Difficulty of Paying  Living Expenses:   Food Insecurity:   . Worried About Programme researcher, broadcasting/film/videounning Out of Food in the Last Year:   . [employer]an Out of Food in the Last Year:   Housing:   . Last Housing Risk Score:   Physical Activity:   . Days of Exercise per Week:   . Minutes of Exercise per Session:   Social Connections:   . Frequency of Communication with Friends and Family:   . Frequency of Social Gatherings with Friends and Family:   . Attends Religious Services:   . Active Member of Clubs or Organizations:   . Attends BankerClub or Organization Meetings:   [name]. Marital Status:   Stress:   . Feeling of Stress :   Tobacco Use: High Risk  . Smoking Tobacco Use: Current Every Day Smoker  . Smokeless Tobacco Use: Never Used  Transportation Needs:   . Freight forwarderLack of Transportation (Medical):   [name]. Lack of Transportation (Non-Medical):     Has this patient used any form of tobacco in the last 30 days? (Cigarettes, Smokeless Tobacco, Cigars, and/or Pipes) A prescription for an FDA-approved tobacco cessation medication was offered at discharge and  the patient refused  Current Medications:  Current Facility-Administered Medications  Medication Dose Route Frequency Provider Last Rate Last Admin  . acetaminophen (TYLENOL) tablet 650 mg  650 mg Oral Q6H PRN Nira Conn A, NP      . alum & mag hydroxide-simeth (MAALOX/MYLANTA) 200-200-20 MG/5ML suspension 30 mL  30 mL Oral Q4H PRN Nira Conn A, NP      . hydrOXYzine (ATARAX/VISTARIL) tablet 25 mg  25 mg Oral Q6H PRN Nira Conn A, NP   25 mg at 09/06/19 0936  . lamoTRIgine (LAMICTAL) tablet 50 mg  50 mg Oral Daily Nira Conn A, NP   50 mg at 09/06/19 0936  . magnesium hydroxide (MILK OF MAGNESIA) suspension 30 mL  30 mL Oral Daily PRN Jackelyn Poling, NP      . PARoxetine (PAXIL) tablet 10 mg  10 mg Oral Once Aldean Baker, NP      . Melene Muller ON 09/07/2019] PARoxetine (PAXIL) tablet 20 mg  20 mg Oral Daily Aldean Baker, NP      . QUEtiapine (SEROQUEL) tablet 100 mg  100 mg Oral QHS Jackelyn Poling, NP       Current Outpatient Medications  Medication Sig Dispense Refill  . hydrOXYzine (ATARAX/VISTARIL) 25 MG tablet Take 1 tablet (25 mg total) by mouth every 6 (six) hours as needed for anxiety. 75 tablet 0  . lamoTRIgine (LAMICTAL) 25 MG tablet Take 2 tablets (50 mg total) by mouth daily. For mood stabilization (Patient taking differently: Take 50 mg by mouth in the morning and at bedtime. For mood stabilization) 60 tablet 0  . nicotine polacrilex (NICORETTE) 2 MG gum Take 1 each (2 mg total) by mouth as needed. (May buy from over the counter): For smoking cessation. (Patient taking differently: Take 2 mg by mouth as needed for smoking cessation. (May buy from over the counter): For smoking cessation.) 100 tablet 0  . PARoxetine (PAXIL) 10 MG tablet Take 1 tablet (10 mg total) by mouth daily. For depression 30 tablet 0  . QUEtiapine (SEROQUEL) 100 MG tablet Take 1 tablet (100 mg total) by mouth at bedtime. For mood control 30 tablet 0    PTA Medications: (Not in a hospital admission)   Musculoskeletal  Strength & Muscle Tone: within normal limits Gait & Station: normal Patient leans: N/A  Psychiatric Specialty Exam  Presentation  General Appearance: Appropriate for Environment;Casual  Eye Contact:Good  Speech:Clear and Coherent;Normal Rate  Speech Volume:Normal  Handedness:Right   Mood and Affect  Mood:Anxious  Affect:Appropriate;Congruent   Thought Process  Thought Processes:Coherent;Goal Directed  Descriptions of Associations:Intact  Orientation:Full (Time, Place and Person)  Thought Content:Logical  Hallucinations:Hallucinations: None  Ideas of Reference:None  Suicidal Thoughts:Suicidal Thoughts: No SI Active Intent and/or Plan: With Intent;With Plan;With Means to Carry Out  Homicidal Thoughts:Homicidal Thoughts: No   Sensorium  Memory:Immediate Good;Recent Good;Remote Good  Judgment:Intact  Insight:Fair   Executive Functions   Concentration:Fair  Attention Span:Fair  Recall:Good  Fund of Knowledge:Fair  Language:Good   Psychomotor Activity  Psychomotor Activity:Psychomotor Activity: Normal   Assets  Assets:Communication Skills;Desire for Improvement;Housing;Physical Health   Sleep  Sleep:Sleep: Good   Physical Exam  Physical Exam Vitals and nursing note reviewed.  Constitutional:      Appearance: He is well-developed.  Cardiovascular:     Rate and Rhythm: Normal rate.  Pulmonary:     Effort: Pulmonary effort is normal.  Neurological:     Mental Status: He is alert and oriented to person, place,  and time.    Review of Systems  Constitutional: Negative.   Respiratory: Negative for cough and shortness of breath.   Psychiatric/Behavioral: Positive for depression and substance abuse. Negative for hallucinations and suicidal ideas. The patient is not nervous/anxious and does not have insomnia.    Blood pressure 126/72, pulse 70, temperature 98 F (36.7 C), temperature source Oral, resp. rate 18, SpO2 100 %. There is no height or weight on file to calculate BMI.  Demographic Factors:  Male and Divorced or widowed  Loss Factors: Loss of significant relationship and Legal issues  Historical Factors: Impulsivity  Risk Reduction Factors:   Responsible for children under 39 years of age, Sense of responsibility to family, Positive social support and Positive coping skills or problem solving skills  Continued Clinical Symptoms:  Alcohol/Substance Abuse/Dependencies  Cognitive Features That Contribute To Risk:  None    Suicide Risk:  Mild:  Suicidal ideation of limited frequency, intensity, duration, and specificity.  There are no identifiable plans, no associated intent, mild dysphoria and related symptoms, good self-control (both objective and subjective assessment), few other risk factors, and identifiable protective factors, including available and accessible social support.  Plan  Of Care/Follow-up recommendations:  Activity as tolerated. Diet as recommended by primary care physician. Keep all scheduled follow-up appointments as recommended.  Disposition: Patient does not meet criteria for inpatient psychiatric hospitalization. He is safe for discharge home. Patient provided with prescription for increased Paxil dose. He has upcoming appointments at [facility] on 09/10/19 and 09/15/19. He is provided with referrals for rehab.  Aldean Baker, NP 09/06/2019, 11:45 AM

## 2019-09-06 NOTE — ED Notes (Signed)
Pt presents with depression, SI and cocaine abuse, plan to hang self from ceiling fan.  Divorce and break up with girlfriend noted as stressors.  Skin search completed, monitoring for safety.

## 2019-09-06 NOTE — Care Management Important Message (Addendum)
SUBSTANCE ABUSE (RESIDENTIAL AND OUTPATIENT OPTIONS):   Residential: [facility] day residential substance abuse facility (not an option if you have active assault charges). [address], [place], [place] [address] Phone: [phone]: Ask for Drema Pry in admissions to complete intake if interested in pursuing this option.  Residential: [facility]: Can get intake scheduled; (not an option if you have active assault charges). [address] [place], [place] [id number]) Call Mon-Fri.  Outpatient: [facility]): (offers outpatient therapy and intensive outpatient substance abuse therapy).  [address], [place], [place] [address] Phone: [phone]  Outpatient: [facility]: Offers FREE recovery skills classes, support groups, 1:1 Peer Support, and Compeer Classes. [address], [place], [place] [address] Phone: [phone] (Call to complete intake).   Residential: [facility] [address] [place], [place] [address] Phone: [phone] ext [phone]  The [facility] provides food, shelter and other programs and services to the homeless men of [place] through our [employer] program.  By offering safe shelter, three meals a day, clean clothing, Biblical counseling, financial planning, vocational training, GED/education and employment assistance, we've helped mend the shattered lives of many homeless men since opening in 1974.  We have approximately 267 beds available, with a max of 312 beds including mats for emergency situations and currently house an average of 270 men a night.  Prospective Client Check-In Information Photo ID Required (State/ Out of State/ [facility]) - if photo ID is not available, clients are required to have a printout of a police/sheriff's criminal history report. Help out with chores around the [facility]. No sex offender of any type (pending, charged, registered and/or any  other sex related offenses) will be permitted to check in. Must be willing to abide by all rules, regulations, and policies established by the [employer]. The following will be provided - shelter, food, clothing, and biblical counseling. If you or someone you know is in need of assistance at our [facility] shelter in [place], [place], please call [phone] ext. [phone].

## 2019-09-06 NOTE — ED Triage Notes (Signed)
Presents with depression,  SI and cocaine abuse.

## 2019-09-10 ENCOUNTER — Ambulatory Visit (INDEPENDENT_AMBULATORY_CARE_PROVIDER_SITE_OTHER): Payer: Medicaid Other | Admitting: Licensed Clinical Social Worker

## 2019-09-10 ENCOUNTER — Encounter (HOSPITAL_COMMUNITY): Payer: Self-pay | Admitting: Licensed Clinical Social Worker

## 2019-09-10 ENCOUNTER — Other Ambulatory Visit: Payer: Self-pay

## 2019-09-10 DIAGNOSIS — F332 Major depressive disorder, recurrent severe without psychotic features: Secondary | ICD-10-CM

## 2019-09-10 DIAGNOSIS — F411 Generalized anxiety disorder: Secondary | ICD-10-CM

## 2019-09-10 DIAGNOSIS — F250 Schizoaffective disorder, bipolar type: Secondary | ICD-10-CM

## 2019-09-10 NOTE — Progress Notes (Signed)
Comprehensive Clinical Assessment (CCA) Note  09/10/2019 Chris Olson [id number]   Virtual Visit via Video Note  I connected with Chris Olson. on 09/10/19 at 11:00 AM EDT by a video enabled telemedicine application and verified that I am speaking with the correct person using two identifiers.  Location: Patient: [facility]  Provider: [facility]    I discussed the limitations of evaluation and management by telemedicine and the availability of in person appointments. The patient expressed understanding and agreed to proceed.  Client is a 29 year old male. Client is referred by [facility] for a  MDD.   Client states mental health symptoms as evidenced by:  auditory hallucinations command telling him to hurt himself, depression, sadness, irritability, rapid thoughts, sadness, hopeless, Change in energy/activity, Fatigue, Tension; Worrying   Client denies suicidal and homicidal ideations at this time    Client was screened for the following SDOH: smoking, financials, food, exercise, stress, interactions, depression, and alcohol   Assessment Information that integrates subjective and objective details with a therapist's professional interpretation:    LCSW and pt met virtually for 45 minutes to conduct initial evaluation for therapy. Pt was alert and oriented x 5, dressed casually, with flat depressed affect.   Pt has Hx of suicidal thoughts and has recently been the [facility] as well as the ED at [facility] in the past 4 weeks. Sang has a psychiatric Hx by chart review of schizoaffective bipolar disorder, depression, and anxiety. Current symptoms are listed above. He denies any homicide or suicidal ideations currently stating, "I just feel really sad". He recently had a bad break up of his girlfriend of 3 years due to pt struggle with alcohol & mental health. Pt is currently looking into [place] at [facility] and will follow up with  them today as he awaits a bed. [place] is currently drinking about 3 to 4 40 oz beer per day for the past 3 years.    Client meets criteria for schizoaffective bipolar type, depression and anxiety   Client states use of the following substances: Alcohol      Treatment recommendations are include plan :   Goals: Elevate mood and show evidence of usual energy, activities, and socialization level.; Reduce irritability and increase normal social interaction with family and friends.; Develop the ability to recognize, accept, and cope with feelings of depression; Alleviate depressed mood and return to previous level of effective functioning, Verbally identify, if possible, the source of depressed mood; Discuss overreliance on significant other for support, direction, and meaning to life.; Begin to experience sadness in session while discussing the disappointment related to the loss or pain from the past; Engage in physical and recreational activities that reflect increased energy and interest; Utilize behavioral strategies to overcome depression; Identify and replace depressive thinking that leads to depressive feelings and actions.    Objectives: make a list of what he/she is depressed about and process it with their therapist, gain insight as to causes, Take prescribed medications responsibly at times ordered by a physician, Make positive statements regarding self and ability to cope with stresses of life, & decrease PHQ-9 and GAD-7 below 10.    Clinician assisted client with scheduling the following appointments: 3 weeks. Clinician details of appointment.    Client was in agreement with treatment recommendations.    I discussed the assessment and treatment plan with the patient. The patient was provided an opportunity to ask questions and all  were answered. The patient agreed with the plan and demonstrated an understanding of the instructions.   The patient was advised to call back or seek an  in-person evaluation if the symptoms worsen or if the condition fails to improve as anticipated.  I provided 45 minutes of non-face-to-face time during this encounter.   Dory Horn, LCSW   Visit Diagnosis:      ICD-10-CM   1. Schizoaffective disorder, bipolar type ([place])  F25.0   2. GAD (generalized anxiety disorder)  F41.1   3. Severe recurrent major depression without psychotic features ([place])  F33.2       CCA Screening, Triage and Referral (STR)  Patient Reported Information  Referral name: Beverly Sessions   Whom do you see for routine medical problems? I don't have a doctor   What Is the Reason for Your Visit/Call Today? Pt is having thoughts of hanging himself or overdosing on pills  How Long Has This Been Causing You Problems? 1 wk - 1 month  What Do You Feel Would Help You the Most Today? Other (Comment) (Pt wants inpatient tx.)   Have You Recently Been in Any Inpatient Treatment (Hospital/Detox/Crisis Center/28-Day Program)? Yes  Name/Location of Program/Hospital:[facility]  How Long Were You There? June 27-August 29, 2019  When Were You Discharged? 08/29/19   Have You Ever Received Services From [employer] Before? Yes  Who Do You [facility] at [facility]   Have You Recently Had Any Thoughts About Hurting Yourself? Yes  Are You Planning to Commit Suicide/Harm Yourself At This time? No (Thoughts of hanging self or overdosing on pills.)   Have you Recently Had Thoughts About [place]? No   Do You Currently Have a Therapist/Psychiatrist? No  Name of Therapist/Psychiatrist: [facility].  Missed last appt due to being inpatient at [facility].   Have You Been Recently Discharged From Any Office Practice or Programs? No     CCA Screening Triage Referral Assessment Type of Contact: Tele-Assessment  Is this Initial or Reassessment? Initial Assessment  Date Telepsych consult ordered in [facility]:  09/10/19  Time Telepsych consult ordered in [facility]:   1730   Patient Reported Information Reviewed? Yes  Collateral Involvement: None   Is CPS involved or ever been involved? Never  Is APS involved or ever been involved? Never   Patient Determined To Be At Risk for Harm To Self or Others Based on Review of Patient Reported Information or Presenting Complaint? No    Location of Assessment: [facility]   Does Patient Present under Involuntary Commitment? No  IVC Papers Initial File Date: 08/22/19   [place] of Residence: [place]   Patient Currently Receiving the Following Services: Individual Therapy;Medication Management   Determination of Need: Emergent (2 hours)   Options For Referral: Chemical Dependency Intensive Outpatient Therapy (CDIOP);Medication Management;Outpatient Therapy     CCA Biopsychosocial  Intake/Chief Complaint:  CCA Intake With Chief Complaint CCA Part Two Date: 09/10/19 Chief Complaint/Presenting Problem: anxiety and depression Patient's Currently Reported Symptoms/Problems: auditory hallucinations command telling him to hurt himself, depression, sadness, irritability, rapid thoughts, sadness, hopeless Type of Services Patient Feels Are Needed: Therapy and medication mgmt  Mental Health Symptoms Depression:  Depression: Change in energy/activity, Hopelessness, Worthlessness, Fatigue, Difficulty Concentrating, Irritability  Mania:  Mania: Irritability, Overconfidence, Racing thoughts, Recklessness  Anxiety:   Anxiety: Tension, Worrying, Sleep  Psychosis:  Psychosis: Hallucinations (auditory command telling him to hurt himself)  Trauma:     Obsessions:     Compulsions:  Inattention:     Hyperactivity/Impulsivity:     Oppositional/Defiant Behaviors:     Emotional Irregularity:     Other Mood/Personality Symptoms:      Mental Status Exam Appearance and self-care  Stature:  Stature: Average  Weight:  Weight: Average weight  Clothing:     Grooming:  Grooming: Normal   Cosmetic use:  Cosmetic Use: None  Posture/gait:  Posture/Gait: Slumped  Motor activity:  Motor Activity: Not Remarkable  Sensorium  Attention:  Attention: Normal  Concentration:  Concentration: Scattered  Orientation:  Orientation: X5  Recall/memory:  Recall/Memory: Normal  Affect and Mood  Affect:  Affect: Anxious, Flat, Depressed  Mood:  Mood: Depressed, Hopeless  Relating  Eye contact:  Eye Contact: Normal  Facial expression:  Facial Expression: Depressed  Attitude toward examiner:  Attitude Toward Examiner: Cooperative  Thought and Language  Speech flow: Speech Flow: Clear and Coherent  Thought content:  Thought Content: Appropriate to Mood and Circumstances  Preoccupation:     Hallucinations:  Hallucinations: Auditory  Organization:     Transport planner of Knowledge:  Fund of Knowledge: Fair  Intelligence:  Intelligence: Average  Abstraction:  Abstraction: Functional  Judgement:  Judgement: Fair  Art therapist:  Reality Testing: Realistic  Insight:     Decision Making:  Decision Making: Impulsive  Social Functioning  Social Maturity:  Social Maturity: Isolates  Social Judgement:     Stress  Stressors:  Stressors: Relationship  Coping Ability:  Coping Ability: Research officer, political party Deficits:  Skill Deficits: Responsibility  Supports:        Religion:    Leisure/Recreation:    Exercise/Diet: Exercise/Diet Do You Exercise?: No Have You Gained or Lost A Significant Amount of Weight in the Past Six Months?: No Do You Follow a Special Diet?: No Do You Have Any Trouble Sleeping?: Yes Explanation of Sleeping Difficulties: yes falling asleep is difficult and when pt does he sleeps too much   CCA Employment/Education  Employment/Work Situation: Employment / Work Situation Employment situation: On disability Patient's job has been impacted by current illness: Yes What is the longest time patient has a held a job?: 2 months in high school Where was the  patient employed at that time?: [employer]  Has patient ever been in the [employer]?: No  Education:     CCA Family/Childhood History  Family and Relationship History: Family history Are you sexually active?: Yes What is your sexual orientation?: heterosexual Has your sexual activity been affected by drugs, alcohol, medication, or emotional stress?: n/a  Does patient have children?: Yes  Childhood History:  Childhood History By whom was/is the patient raised?: Both parents Additional childhood history information: Mom and dad were married until patient turned 31. "My childhood was okay. My parents were controlling."  Description of patient's relationship with caregiver when they were a child: close to mother; close to father How were you disciplined when you got in trouble as a child/adolescent?: n/a  Did patient suffer any verbal/emotional/physical/sexual abuse as a child?: No Has patient ever been sexually abused/assaulted/raped as an adolescent or adult?: No Was the patient ever a victim of a crime or a disaster?: No Witnessed domestic violence?: Yes Has patient been affected by domestic violence as an adult?: No  Child/Adolescent Assessment:     CCA Substance Use  Alcohol/Drug Use: Alcohol / Drug Use Pain Medications: See PTA medication list Prescriptions: See [facility] d/c med list from 08/29/19. Over the Counter: See PTA medication list. History of alcohol / drug  use?: Yes Longest period of sobriety (when/how long): unknown Negative Consequences of Use: Financial, Legal, Personal relationships Withdrawal Symptoms: Patient aware of relationship between substance abuse and physical/medical complications, Nausea / Vomiting Substance #1 Name of Substance 1: ETOH 1 - Age of First Use: 29 years of age 22 - Amount (size/oz): Three to four 40's per day 1 - Frequency: Daily 1 - Duration: ongoing 1 - Last Use / Amount: 09/04/19.  Drank four 40's Substance #2 Name of Substance 2:  Cocaine 2 - Age of First Use: 15 2 - Amount (size/oz): Varies, ususally a gram or so. 2 - Frequency: Twice in a month 2 - Duration: ongoing 2 - Last Use / Amount: Yesterday (07/09).   DSM5 Diagnoses: Patient Active Problem List   Diagnosis Date Noted  . Severe recurrent major depression without psychotic features ([place]) 08/23/2019  . Cocaine abuse with cocaine-induced mood disorder ([place]) 11/11/2017    Patient Centered Plan: Patient is on the following Treatment Plan(s):  Depression     Dory Horn

## 2019-09-15 ENCOUNTER — Other Ambulatory Visit: Payer: Self-pay

## 2019-09-15 ENCOUNTER — Telehealth (HOSPITAL_COMMUNITY): Payer: Medicaid Other | Admitting: Psychiatric/Mental Health

## 2019-10-06 ENCOUNTER — Other Ambulatory Visit: Payer: Self-pay

## 2019-10-06 ENCOUNTER — Ambulatory Visit (HOSPITAL_COMMUNITY): Payer: Medicaid Other | Admitting: Licensed Clinical Social Worker

## 2019-10-06 DIAGNOSIS — F322 Major depressive disorder, single episode, severe without psychotic features: Secondary | ICD-10-CM

## 2019-10-06 NOTE — Progress Notes (Signed)
SW attempted appointment as scheduled. Pt was on the bus and stated he did not want to do therapy on the bus. LCSW gave pt option to reschedule and pt already had an appointment scheduled for 8/31 at 1pm. He is agreeable to keep that appointment.

## 2019-10-06 NOTE — Progress Notes (Deleted)
SW attempted appointment as scheduled. Pt was on the bus and stated he did not want to do therapy on the bus. LCSW gave pt option to reschedule and pt already had an appointment scheduled for 8/31 at 1pm. He is agreeable to keep that appointment.   

## 2019-10-11 ENCOUNTER — Encounter (HOSPITAL_BASED_OUTPATIENT_CLINIC_OR_DEPARTMENT_OTHER): Payer: Self-pay | Admitting: Emergency Medicine

## 2019-10-11 ENCOUNTER — Emergency Department (HOSPITAL_BASED_OUTPATIENT_CLINIC_OR_DEPARTMENT_OTHER)
Admission: EM | Admit: 2019-10-11 | Discharge: 2019-10-11 | Disposition: A | Discharge status: Home / Self Care | Payer: Medicaid Other | Attending: Emergency Medicine | Admitting: Emergency Medicine

## 2019-10-11 ENCOUNTER — Other Ambulatory Visit: Payer: Self-pay

## 2019-10-11 DIAGNOSIS — R002 Palpitations: Secondary | ICD-10-CM | POA: Insufficient documentation

## 2019-10-11 DIAGNOSIS — F1290 Cannabis use, unspecified, uncomplicated: Secondary | ICD-10-CM | POA: Insufficient documentation

## 2019-10-11 DIAGNOSIS — Z20822 Contact with and (suspected) exposure to covid-19: Secondary | ICD-10-CM | POA: Diagnosis not present

## 2019-10-11 DIAGNOSIS — J069 Acute upper respiratory infection, unspecified: Secondary | ICD-10-CM | POA: Diagnosis not present

## 2019-10-11 DIAGNOSIS — F17210 Nicotine dependence, cigarettes, uncomplicated: Secondary | ICD-10-CM | POA: Insufficient documentation

## 2019-10-11 LAB — SARS CORONAVIRUS 2 BY RT PCR (HOSPITAL ORDER, PERFORMED IN ~~LOC~~ HOSPITAL LAB): SARS Coronavirus 2: NEGATIVE

## 2019-10-11 NOTE — ED Provider Notes (Signed)
[facility] EMERGENCY DEPARTMENT Provider Note   CSN: [id number] Arrival date & time: 10/11/19  6063     History Chief Complaint  Patient presents with  . Medication Refill  . Palpitations    Chris Olson. is a 29 y.o. male.   Palpitations Palpitations quality:  Slow Onset quality:  Gradual Timing:  Constant Progression:  Waxing and waning Chronicity:  New Context comment:  Uri type symptoms Relieved by:  Nothing Worsened by:  Nothing Ineffective treatments:  None tried Associated symptoms: no back pain, no chest pain, no chest pressure, no cough, no dizziness, no nausea, no near-syncope, no shortness of breath and no vomiting        Past Medical History:  Diagnosis Date  . Anxiety   . Chest pain   . Depression   . Pericarditis   . Schizophrenia [facility])     Patient Active Problem List   Diagnosis Date Noted  . Severe recurrent major depression without psychotic features (HCC) 08/23/2019  . Cocaine abuse with cocaine-induced mood disorder (HCC) 11/11/2017    History reviewed. No pertinent surgical history.     No family history on file.  Social History   Tobacco Use  . Smoking status: Current Every Day Smoker    Packs/day: 0.75    Types: Cigarettes  . Smokeless tobacco: Never Used  Substance Use Topics  . Alcohol use: Yes    Alcohol/week: 15.0 standard drinks    Types: 15 Cans of beer per week    Comment: 3-4 40 oz beers per day.   . Drug use: Yes    Types: Marijuana, Cocaine    Comment: UDS was clear     Home Medications Prior to Admission medications   Medication Sig Start Date End Date Taking? Authorizing Provider  hydrOXYzine (ATARAX/VISTARIL) 25 MG tablet Take 1 tablet (25 mg total) by mouth every 6 (six) hours as needed for anxiety. 08/29/19   Armandina Stammer I, NP  lamoTRIgine (LAMICTAL) 25 MG tablet Take 2 tablets (50 mg total) by mouth daily. For mood stabilization Patient taking differently: Take 50 mg by mouth in the  morning and at bedtime. For mood stabilization 08/29/19   Armandina Stammer I, NP  nicotine polacrilex (NICORETTE) 2 MG gum Take 1 each (2 mg total) by mouth as needed. (May buy from over the counter): For smoking cessation. Patient taking differently: Take 2 mg by mouth as needed for smoking cessation. (May buy from over the counter): For smoking cessation. 08/29/19   Armandina Stammer I, NP  PARoxetine (PAXIL) 20 MG tablet Take 1 tablet (20 mg total) by mouth daily. 09/07/19   Aldean Baker, NP  QUEtiapine (SEROQUEL) 100 MG tablet Take 1 tablet (100 mg total) by mouth at bedtime. For mood control 08/29/19   Sanjuana Kava, NP    Allergies    Patient has no known allergies.  Review of Systems   Review of Systems  Constitutional: Negative for chills and fever.  HENT: Positive for congestion and rhinorrhea.   Respiratory: Negative for cough and shortness of breath.   Cardiovascular: Positive for palpitations. Negative for chest pain and near-syncope.  Gastrointestinal: Negative for diarrhea, nausea and vomiting.  Genitourinary: Negative for difficulty urinating and dysuria.  Musculoskeletal: Negative for arthralgias and back pain.  Skin: Negative for color change and rash.  Neurological: Negative for dizziness, light-headedness and headaches.    Physical Exam Updated Vital Signs BP (!) 132/91 (BP Location: Left Arm)   Pulse 71  Temp 98.2 F (36.8 C) (Oral)   Resp 16   Ht 5\' 8"  (1.727 m)   Wt 61.2 kg   SpO2 99%   BMI 20.53 kg/m   Physical Exam Vitals and nursing note reviewed. Exam conducted with a chaperone present.  Constitutional:      General: He is not in acute distress.    Appearance: Normal appearance.  HENT:     Head: Normocephalic and atraumatic.     Nose: No rhinorrhea.  Eyes:     General:        Right eye: No discharge.        Left eye: No discharge.     Conjunctiva/sclera: Conjunctivae normal.  Cardiovascular:     Rate and Rhythm: Normal rate and regular rhythm.      Heart sounds: No murmur heard.  No friction rub. No gallop.   Pulmonary:     Effort: Pulmonary effort is normal.     Breath sounds: No stridor.  Abdominal:     General: Abdomen is flat. There is no distension.     Palpations: Abdomen is soft.  Musculoskeletal:        General: No deformity or signs of injury.  Skin:    General: Skin is warm and dry.  Neurological:     General: No focal deficit present.     Mental Status: He is alert. Mental status is at baseline.     Motor: No weakness.  Psychiatric:        Mood and Affect: Mood normal.        Behavior: Behavior normal.        Thought Content: Thought content normal.     ED Results / Procedures / Treatments   Labs (all labs ordered are listed, but only abnormal results are displayed) Labs Reviewed  SARS CORONAVIRUS 2 BY RT PCR (HOSPITAL ORDER, PERFORMED IN [facility])    EKG None  Radiology No results found.  Procedures Procedures (including critical care time)  Medications Ordered in ED Medications - No data to display  ED Course  I have reviewed the triage vital signs and the nursing notes.  Pertinent labs & imaging results that were available during my care of the patient were reviewed by me and considered in my medical decision making (see chart for details).    MDM Rules/Calculators/A&P                          Mild URI type symptoms, out of the Seroquel, I am concerned he might have gotten Covid.  Strange taste in his mouth.  No known exposure.  EKG done shows sinus rhythm with no acute ischemic change interval abnormality or arrhythmia, similar when compared to previous, early repolarization noted.  Patient has no chest pain no shortness of breath no abnormal exam findings.  He cannot describe how he is feeling other than he feels as though his heart is going slower.  He does not feel racing he does not feel pain he does not feel flutter.  I reviewed his old EKGs and his EKGs are unchanged.   Covid test is sent.  He is told to follow-up with his provider provides his Seroquel for routine maintenance of his chronic healthcare needs.  Says he takes Seroquel for sleep.  Vital signs are stable he is afebrile and safe for discharge home strict return precautions given.  Final Clinical Impression(s) / ED Diagnoses Final diagnoses:  Viral  upper respiratory tract infection    Rx / DC Orders ED Discharge Orders    None       Sabino Donovan, MD 10/11/19 [phone]

## 2019-10-11 NOTE — ED Triage Notes (Signed)
He reports that he is out of his Seroquel for 1 week and states he feels like his heart slow and speeds up at times. He would also like to be tested for COVID. Denies pain.

## 2019-10-11 NOTE — Discharge Instructions (Addendum)
Your Covid test is pending when you left, check on it at my chart later today, the results positive or detected meaning we detected Covid virus and noon you have the infection.  Negative for undetected means we did not detect Covid and you do not have the virus

## 2019-10-27 ENCOUNTER — Other Ambulatory Visit: Payer: Self-pay

## 2019-10-27 ENCOUNTER — Ambulatory Visit (HOSPITAL_COMMUNITY): Payer: Medicaid Other | Admitting: Licensed Clinical Social Worker

## 2019-10-27 ENCOUNTER — Telehealth (HOSPITAL_COMMUNITY): Payer: Self-pay | Admitting: Licensed Clinical Social Worker

## 2019-10-27 NOTE — Telephone Encounter (Signed)
LCSW sent two links and called pt no response for 1PM therapy session.

## 2021-09-13 ENCOUNTER — Encounter (HOSPITAL_COMMUNITY): Payer: Self-pay

## 2021-09-13 ENCOUNTER — Other Ambulatory Visit: Payer: Self-pay

## 2021-09-13 ENCOUNTER — Ambulatory Visit (HOSPITAL_COMMUNITY)
Admission: EM | Admit: 2021-09-13 | Discharge: 2021-09-13 | Disposition: A | Discharge status: Home / Self Care | Payer: Medicaid Other | Attending: Nurse Practitioner | Admitting: Nurse Practitioner

## 2021-09-13 DIAGNOSIS — Z20822 Contact with and (suspected) exposure to covid-19: Secondary | ICD-10-CM | POA: Insufficient documentation

## 2021-09-13 DIAGNOSIS — F17210 Nicotine dependence, cigarettes, uncomplicated: Secondary | ICD-10-CM | POA: Insufficient documentation

## 2021-09-13 DIAGNOSIS — F251 Schizoaffective disorder, depressive type: Secondary | ICD-10-CM | POA: Insufficient documentation

## 2021-09-13 DIAGNOSIS — F332 Major depressive disorder, recurrent severe without psychotic features: Secondary | ICD-10-CM

## 2021-09-13 DIAGNOSIS — F319 Bipolar disorder, unspecified: Secondary | ICD-10-CM | POA: Insufficient documentation

## 2021-09-13 DIAGNOSIS — F1020 Alcohol dependence, uncomplicated: Secondary | ICD-10-CM | POA: Insufficient documentation

## 2021-09-13 DIAGNOSIS — F1414 Cocaine abuse with cocaine-induced mood disorder: Secondary | ICD-10-CM | POA: Insufficient documentation

## 2021-09-13 DIAGNOSIS — F1290 Cannabis use, unspecified, uncomplicated: Secondary | ICD-10-CM | POA: Insufficient documentation

## 2021-09-13 DIAGNOSIS — F1910 Other psychoactive substance abuse, uncomplicated: Secondary | ICD-10-CM | POA: Diagnosis present

## 2021-09-13 DIAGNOSIS — F4310 Post-traumatic stress disorder, unspecified: Secondary | ICD-10-CM | POA: Insufficient documentation

## 2021-09-13 LAB — POCT URINE DRUG SCREEN - MANUAL ENTRY (I-SCREEN)
POC Amphetamine UR: NOT DETECTED
POC Buprenorphine (BUP): NOT DETECTED
POC Cocaine UR: POSITIVE — AB
POC Marijuana UR: POSITIVE — AB
POC Methadone UR: NOT DETECTED
POC Methamphetamine UR: NOT DETECTED
POC Morphine: NOT DETECTED
POC Oxazepam (BZO): NOT DETECTED
POC Oxycodone UR: NOT DETECTED
POC Secobarbital (BAR): NOT DETECTED

## 2021-09-13 LAB — LIPID PANEL
Cholesterol: 198 mg/dL (ref 0–200)
HDL: 63 mg/dL (ref >40)
LDL Cholesterol: 110 mg/dL — ABNORMAL HIGH (ref 0–99)
Total CHOL/HDL Ratio: 3.1 RATIO
Triglycerides: 123 mg/dL (ref <150)
VLDL: 25 mg/dL (ref 0–40)

## 2021-09-13 LAB — CBC WITH DIFFERENTIAL/PLATELET
Abs Immature Granulocytes: 0.04 10*3/uL (ref 0.00–0.07)
Basophils Absolute: 0.1 10*3/uL (ref 0.0–0.1)
Basophils Relative: 1 %
Eosinophils Absolute: 0.3 10*3/uL (ref 0.0–0.5)
Eosinophils Relative: 3 %
HCT: 42.2 % (ref 39.0–52.0)
Hemoglobin: 15.1 g/dL (ref 13.0–17.0)
Immature Granulocytes: 0 %
Lymphocytes Relative: 24 %
Lymphs Abs: 2.2 10*3/uL (ref 0.7–4.0)
MCH: 33.9 pg (ref 26.0–34.0)
MCHC: 35.8 g/dL (ref 30.0–36.0)
MCV: 94.6 fL (ref 80.0–100.0)
Monocytes Absolute: 0.5 10*3/uL (ref 0.1–1.0)
Monocytes Relative: 5 %
Neutro Abs: 6.4 10*3/uL (ref 1.7–7.7)
Neutrophils Relative %: 67 %
Platelets: 174 10*3/uL (ref 150–400)
RBC: 4.46 MIL/uL (ref 4.22–5.81)
RDW: 11.8 % (ref 11.5–15.5)
WBC: 9.4 10*3/uL (ref 4.0–10.5)
nRBC: 0 % (ref 0.0–0.2)

## 2021-09-13 LAB — HEMOGLOBIN A1C
Hgb A1c MFr Bld: 5 % (ref 4.8–5.6)
Mean Plasma Glucose: 96.8 mg/dL

## 2021-09-13 LAB — COMPREHENSIVE METABOLIC PANEL
ALT: 20 U/L (ref 0–44)
AST: 23 U/L (ref 15–41)
Albumin: 4.5 g/dL (ref 3.5–5.0)
Alkaline Phosphatase: 61 U/L (ref 38–126)
Anion gap: 11 (ref 5–15)
BUN: 5 mg/dL — ABNORMAL LOW (ref 6–20)
CO2: 23 mmol/L (ref 22–32)
Calcium: 9.2 mg/dL (ref 8.9–10.3)
Chloride: 107 mmol/L (ref 98–111)
Creatinine, Ser: 1.04 mg/dL (ref 0.61–1.24)
GFR, Estimated: >60 mL/min (ref >60)
Glucose, Bld: 75 mg/dL (ref 70–99)
Potassium: 3.8 mmol/L (ref 3.5–5.1)
Sodium: 141 mmol/L (ref 135–145)
Total Bilirubin: 0.5 mg/dL (ref 0.3–1.2)
Total Protein: 7.2 g/dL (ref 6.5–8.1)

## 2021-09-13 LAB — ETHANOL: Alcohol, Ethyl (B): 86 mg/dL — ABNORMAL HIGH (ref <10)

## 2021-09-13 LAB — TSH: TSH: 3.09 u[IU]/mL (ref 0.350–4.500)

## 2021-09-13 LAB — RESP PANEL BY RT-PCR (FLU A&B, COVID) ARPGX2
Influenza A by PCR: NEGATIVE
Influenza B by PCR: NEGATIVE
SARS Coronavirus 2 by RT PCR: NEGATIVE

## 2021-09-13 LAB — POC SARS CORONAVIRUS 2 AG: SARSCOV2ONAVIRUS 2 AG: NEGATIVE

## 2021-09-13 MED ORDER — LOPERAMIDE HCL 2 MG PO CAPS: 2.0000 mg | ORAL_CAPSULE | ORAL | Status: DC | PRN

## 2021-09-13 MED ORDER — CARIPRAZINE HCL 3 MG PO CAPS
3.0000 mg | ORAL_CAPSULE | Freq: Every day | ORAL | Status: DC
  Administered 2021-09-13: 3 mg via ORAL
  Filled 2021-09-13: qty 1

## 2021-09-13 MED ORDER — LAMOTRIGINE 25 MG PO TABS
50.0000 mg | ORAL_TABLET | Freq: Two times a day (BID) | ORAL | Status: DC
  Administered 2021-09-13: 50 mg via ORAL
  Filled 2021-09-13: qty 2

## 2021-09-13 MED ORDER — ONDANSETRON 4 MG PO TBDP: 4.0000 mg | ORAL_TABLET | Freq: Four times a day (QID) | ORAL | Status: DC | PRN

## 2021-09-13 MED ORDER — THIAMINE HCL 100 MG/ML IJ SOLN: 100.0000 mg | Freq: Once | INTRAMUSCULAR | Status: DC

## 2021-09-13 MED ORDER — LAMOTRIGINE 25 MG PO TABS: 50.0000 mg | ORAL_TABLET | Freq: Two times a day (BID) | ORAL | Status: DC

## 2021-09-13 MED ORDER — HYDROXYZINE HCL 25 MG PO TABS: 25.0000 mg | ORAL_TABLET | Freq: Four times a day (QID) | ORAL | Status: DC | PRN

## 2021-09-13 MED ORDER — MAGNESIUM HYDROXIDE 400 MG/5ML PO SUSP: 30.0000 mL | Freq: Every day | ORAL | Status: DC | PRN

## 2021-09-13 MED ORDER — NICOTINE 21 MG/24HR TD PT24: 21.0000 mg | MEDICATED_PATCH | Freq: Every day | TRANSDERMAL | Status: DC

## 2021-09-13 MED ORDER — ADULT MULTIVITAMIN W/MINERALS CH
1.0000 | ORAL_TABLET | Freq: Every day | ORAL | Status: DC
  Administered 2021-09-13: 1 via ORAL
  Filled 2021-09-13: qty 1

## 2021-09-13 MED ORDER — ACETAMINOPHEN 325 MG PO TABS: 650.0000 mg | ORAL_TABLET | Freq: Four times a day (QID) | ORAL | Status: DC | PRN

## 2021-09-13 MED ORDER — ALUM & MAG HYDROXIDE-SIMETH 200-200-20 MG/5ML PO SUSP: 30.0000 mL | ORAL | Status: DC | PRN

## 2021-09-13 MED ORDER — LORAZEPAM 1 MG PO TABS: 1.0000 mg | ORAL_TABLET | Freq: Four times a day (QID) | ORAL | Status: DC | PRN

## 2021-09-13 MED ORDER — THIAMINE HCL 100 MG PO TABS: 100.0000 mg | ORAL_TABLET | Freq: Every day | ORAL | Status: DC

## 2021-09-13 NOTE — ED Provider Notes (Addendum)
FBC/OBS ASAP Discharge Summary  Date and Time: 09/13/2021 5:05 PM  Name: Chris Olson.  MRN:  [id number]   Discharge Diagnoses:  Final diagnoses:  Severe episode of recurrent major depressive disorder, without psychotic features (HCC)  Schizoaffective disorder, depressive type (HCC)  Cocaine abuse with cocaine-induced mood disorder (HCC)  Alcohol use disorder, severe, dependence (HCC)  Polysubstance abuse (HCC)    Subjective: Chris Tout Montez Hageman. is a 31 y.o. male, with PMH MDD, alcohol use d/o, cocaine use d/o, schizoaffective disorder, who presented via EMS & [facility] to [facility] (09/13/2021) voluntary from his motel with complaints of feeling unloved and an overall lack of care about life.   Per H&P: "  Peniel Baroni Jr. Is a 31 year old male who was brought voluntarily to the [facility] after he called [facility] with complaints of "wanting to get away from everyone, feeling down, depressed, and don't care what happens". Pt reports a history of Schizoaffective disorder, Bipolar disorder, PTSD, manic depression, anxiety, cocaine abuse with cocaine induced mood disorder, and alcohol abuse.    On why he is here today, the patient reports " I feel like no one cares, my feelings don't matter, I just wanna get away from everyone". " I was just in my feelings, I can't control my emotions, I get really down, depressed, and I sometimes don't care what happens".  Pt reports feeling helpless, Tearful and crying at times, hopeless, guilt, lack of motivation, Isolation, worthlessness and sad for a long time now.  Pt reports feeling paranoid around others " I feel like people are talking about me, out to get me, out to hurt me, and I'll rather be locked away somewhere".    Pt reports " I stay at the motel with my lady and her sister, and I feel like I can't talk to her, I feel pushed aside, I just wanna be left alone for a while, get away, I pace a lot, move about, I can't be still  because I'm worried". " I just want to be locked up somewhere and be left alone". Pt denies SI/HI/AVH. Pt denies access to weapons or self harm behaviors.  Pt reports he is originally from [place] and does not have family in [place]. Pt reports he has a daughter and has not seen his daughter for a long time because her mother would not let him get close to her. Pt reports he is unemployed and on disability.   Pt reports he receives mental health services at [facility], and spoke to his psychiatrist at 3 days ago about his feelings. Pt reports his next appointment is scheduled for 09/22/21. Pt reports he does not see a therapist. Pt reports his psychiatrist prescribed Vraylar for depression, Lamotrigine, for mood stabilization and prn hydroxyzine for anxiety. Pt reports he takes his medications as prescribed. Pt reports his sleep as fair and appetite as ok.   Pt reports he drinks alcohol daily to help his feelings, and started drinking at 60 or 31 years old. Pt reports he last drank yesterday and took 2-3, 40 oz beer and some liquor "a cup I guess". Pt reports using cocaine powder tonight " a little bit", and unsure of when he started using. Pt reports marijuana use "2-3 days ago, a little bit".   Pt provided support and encouragement about ongoing stressors.    Pt gave provider and  counselor permission to speak with his lady, Ms Rodell Perna Section at [phone]). Ms Rodell Perna confirmed the patient  was her fiance and she didn't know where he was tonight because he told her he was going for a walk earlier. She reports he has "schizophrenia tendencies", and was agitated and pacing earlier. She reports " I was worried about him and didn't know he came there". " I don't know exactly what triggered him, and [place] just [place] me [place], I thought he was ok". "I don't have any safety concerns about him hurting himself or me". "He might have something going on in his mind". " I'm glad he came to  get help".    On evaluation, patient is alert, oriented x 3, and cooperative. Speech is clear, coherent and logical. Pt appears casual. Eye contact is fair. Mood is depressed, worthless and hopeless, affect is congruent with mood. Thought process is logical and thought content is coherent. Pt denies SI/HI/AVH. There is no indication that the patient is responding to internal stimuli. No delusions elicited during this assessment.     Pt scored a 22 total on his PHQ-9 assessment showing severely depressed mood.  "   Pt narrative on day of dc:  Symptoms appear to have started recently and escalated the previous night. He denies experiencing hallucinations, and does not report hearing voices or feeling as if the TV is speaking to him directly. However, he does report "paranoia" about "people trying to get me." Patient denies current suicidal ideation but admits to experiencing thoughts of wanting to "get away from life" (when asked for clarification, he said he wanted to get away from his stressors) the night prior to the visit, and he declined to elaborate further. He denies any recent attempts or concrete plans for suicide but admits to a past history of suicide attempts.  Patient endorses decreased sleep and decreased appetite.  Substance Use: Patient reports drinking coffee irregularly and denies using other sources of caffeine. He smokes half to a full pack of cigarettes daily, a habit he began at age 31. He admits to drinking alcohol three to four times a week, with consumption levels of up to two 40-ounce bottles per session. His last drink was last night. He reports occasional tremors after stopping alcohol but denies any history of seizures or hallucinations related to alcohol withdrawal.  Patient admits to using marijuana, spending approximately $10-20 weekly. He also admits to occasional use of cocaine, spending around $60 when he uses, but reports that he has been trying to reduce his use. He  denies the use of methamphetamine, heroin, LSD, or any other illicit substances.  Patient is currently taking three prescribed medications: Lamictal, Vraylar, and Hydroxyzine. He denies any known allergies to medications.  Patient reported a history of head trauma during his childhood. He denies a history of seizures. He endorses occasional chest pain that he identi   Stay Summary:  - [facility] - [facility] and [facility] - patient discharged to home after contracting for safety, safety planning  See below for detailed plan   Total Time spent with patient: 30 minutes  Past Psychiatric and Medical History:  Past Medical History:  Diagnosis Date   Anxiety    Chest pain    Depression    Pericarditis    Schizophrenia (HCC)     History reviewed. No pertinent surgical history. Family History:  History reviewed. No pertinent family history. Family Psychiatric History: family history of psychiatric illness, no family history of completed suicide Social  History:  Social History   Substance and Sexual Activity  Alcohol Use Yes   Alcohol/week: 15.0 standard drinks of alcohol   Types: 15 Cans of beer per week   Comment: (2) 40 oz beers per day most days; last drink tonight     Social History   Substance and Sexual Activity  Drug Use Yes   Types: Marijuana, Cocaine   Comment: last use coc - hours before arrival; last use marijuana: 2 days ago    Social History   Socioeconomic History   Marital status: Divorced    Spouse name: Not on file   Number of children: Not on file   Years of education: Not on file   Highest education level: Not on file  Occupational History   Not on file  Tobacco Use   Smoking status: Every Day    Packs/day: 0.75    Types: Cigarettes   Smokeless tobacco: Never  Vaping Use   Vaping Use: Never used  Substance and Sexual Activity   Alcohol use: Yes     Alcohol/week: 15.0 standard drinks of alcohol    Types: 15 Cans of beer per week    Comment: (2) 40 oz beers per day most days; last drink tonight   Drug use: Yes    Types: Marijuana, Cocaine    Comment: last use coc - hours before arrival; last use marijuana: 2 days ago   Sexual activity: Not Currently  Other Topics Concern   Not on file  Social History Narrative   Not on file   Social Determinants of Health   Financial Resource Strain: Medium Risk (09/10/2019)   Overall Financial Resource Strain (CARDIA)    Difficulty of Paying Living Expenses: Somewhat hard  Food Insecurity: Food Insecurity Present (09/10/2019)   Hunger Vital Sign    Worried About Running Out of Food in the Last Year: Sometimes true    Ran Out of Food in the Last Year: Sometimes true  Transportation Needs: No Transportation Needs (09/10/2019)   PRAPARE - Administrator, Civil Service (Medical): No    Lack of Transportation (Non-Medical): No  Physical Activity: Inactive (09/10/2019)   Exercise Vital Sign    Days of Exercise per Week: 0 days    Minutes of Exercise per Session: 0 min  Stress: Stress Concern Present (09/10/2019)   [employer] of Occupational Health - Occupational Stress Questionnaire    Feeling of Stress : Very much  Social Connections: Socially Isolated (09/10/2019)   Social Connection and Isolation Panel [NHANES]    Frequency of Communication with Friends and Family: Never    Frequency of Social Gatherings with Friends and Family: Never    Attends Religious Services: Never    Database administrator or Organizations: No    Attends Banker Meetings: Never    Marital Status: Separated   SDOH:  SDOH Screenings   Alcohol Screen: Medium Risk (09/13/2021)   Alcohol Screen    Last Alcohol Screening Score (AUDIT): 14  Depression (PHQ2-9): Medium Risk (09/13/2021)   Depression (PHQ2-9)    PHQ-2 Score: 22  Financial Resource Strain: Medium Risk (09/10/2019)   Overall  Financial Resource Strain (CARDIA)    Difficulty of Paying Living Expenses: Somewhat hard  Food Insecurity: Food Insecurity Present (09/10/2019)   Hunger Vital Sign    Worried About Running Out of Food in the Last Year: Sometimes true    Ran Out of Food in the Last Year: Sometimes true  Housing: Low  Risk  (09/10/2019)   Housing    Last Housing Risk Score: 0  Physical Activity: Inactive (09/10/2019)   Exercise Vital Sign    Days of Exercise per Week: 0 days    Minutes of Exercise per Session: 0 min  Social Connections: Socially Isolated (09/10/2019)   Social Connection and Isolation Panel [NHANES]    Frequency of Communication with Friends and Family: Never    Frequency of Social Gatherings with Friends and Family: Never    Attends Religious Services: Never    Database administrator or Organizations: No    Attends Banker Meetings: Never    Marital Status: Separated  Stress: Stress Concern Present (09/10/2019)   [employer] of Occupational Health - Occupational Stress Questionnaire    Feeling of Stress : Very much  Tobacco Use: High Risk (09/13/2021)   Patient History    Smoking Tobacco Use: Every Day    Smokeless Tobacco Use: Never    Passive Exposure: Not on file  Transportation Needs: No Transportation Needs (09/10/2019)   PRAPARE - Transportation    Lack of Transportation (Medical): No    Lack of Transportation (Non-Medical): No    Tobacco Cessation:  Prescription not provided because: patient did not desire treatment  Current Medications:  No current facility-administered medications for this encounter.   Current Outpatient Medications  Medication Sig Dispense Refill   cariprazine (VRAYLAR) 3 MG capsule Take 3 mg by mouth daily.     hydrOXYzine (ATARAX/VISTARIL) 25 MG tablet Take 1 tablet (25 mg total) by mouth every 6 (six) hours as needed for anxiety. (Patient taking differently: Take 25 mg by mouth 3 (three) times daily as needed (For anxiety, agitation  or panic attacks).) 75 tablet 0   lamoTRIgine (LAMICTAL) 150 MG tablet Take 150 mg by mouth 2 (two) times daily.      PTA Medications: (Not in a hospital admission)       09/13/2021    3:42 AM 09/10/2019   11:16 AM 09/05/2019    7:43 PM  Depression screen PHQ 2/9  Decreased Interest 3 3 3   Down, Depressed, Hopeless 3 3 3   PHQ - 2 Score 6 6 6   Altered sleeping 2 3 3   Tired, decreased energy 2 3 3   Change in appetite 2 3 3   Feeling bad or failure about yourself  3 3 3   Trouble concentrating 3 3 3   Moving slowly or fidgety/restless 3 2 3   Suicidal thoughts 1 3 3   PHQ-9 Score 22 26 27   Difficult doing work/chores Very difficult Extremely dIfficult     Flowsheet Row ED from 09/13/2021 in [facility] ED from 09/05/2019 in [facility]  C-SSRS RISK CATEGORY No Risk High Risk High Risk        Psychiatric Specialty Exam  Presentation  General Appearance: Fairly Groomed   Eye Contact:Poor   Speech:Slow   Speech Volume:Decreased   Mood and Affect  Mood:Depressed; Dysphoric; Worthless; Hopeless   Affect:Depressed; Restricted    Thought Process  Thought Processes:Coherent; Linear   Descriptions of Associations:Intact   Orientation:Full (Time, Place and Person)   Thought Content:Logical; WDL   Diagnosis of Schizophrenia or Schizoaffective disorder in past: No data recorded    Hallucinations:Hallucinations: None   Ideas of Reference:None   Suicidal Thoughts:Suicidal Thoughts: No   Homicidal Thoughts:Homicidal Thoughts: No    Sensorium  Memory:Immediate Good; Recent Good; Remote Good  Judgment:Fair   Insight:Poor    Executive Functions  Concentration:Good   Attention Span:Good   Recall:Good   Fund of Knowledge:Good   Language:Good    Psychomotor Activity  Psychomotor  Activity:Psychomotor Activity: Psychomotor Retardation    Assets  Assets:Social Support    Sleep  Sleep:Sleep: Poor Number of Hours of Sleep: 4    Nutritional Assessment (For OBS and FBC admissions only) Has the patient had a weight loss or gain of 10 pounds or more in the last 3 months?: No Has the patient had a decrease in food intake/or appetite?: Yes Does the patient have dental problems?: No Does the patient have eating habits or behaviors that may be indicators of an eating disorder including binging or inducing vomiting?: No Has the patient recently lost weight without trying?: 0 Has the patient been eating poorly because of a decreased appetite?: 1 Malnutrition Screening Tool Score: 1     Physical Exam  Physical Exam Vitals and nursing note reviewed.  Constitutional:      Appearance: Normal appearance.  HENT:     Head: Normocephalic and atraumatic.  Pulmonary:     Effort: Pulmonary effort is normal.  Neurological:     General: No focal deficit present.     Mental Status: He is alert and oriented to person, place, and time.    Review of Systems  Constitutional: Negative.   Respiratory: Negative.    Cardiovascular:  Positive for chest pain.       Patient points to lower left side of rib as source of pain  Gastrointestinal: Negative.   Genitourinary: Negative.    Blood pressure 113/90, pulse (!) 101, temperature 98.7 F (37.1 C), temperature source Tympanic, resp. rate 20, SpO2 96 %. There is no height or weight on file to calculate BMI.  Demographic Factors:  Male, Low socioeconomic status, and Unemployed  Loss Factors: NA  Historical Factors: Family history of mental illness or substance abuse  Risk Reduction Factors:   Positive social support  Continued Clinical Symptoms:  Depression:   Anhedonia Comorbid alcohol abuse/dependence Hopelessness Insomnia Severe More than one psychiatric diagnosis Previous Psychiatric Diagnoses and  Treatments  Cognitive Features That Contribute To Risk:  None    Suicide Risk:  Mild:  Suicidal ideation of limited frequency, intensity, duration, and specificity.  There are no identifiable plans, no associated intent, mild dysphoria and related symptoms, good self-control (both objective and subjective assessment), few other risk factors, and identifiable protective factors, including available and accessible social support.  Plan Of Care/Follow-up recommendations:  Assessment and Rationale for Discharge: At this time the patient does not endorse suicidal ideation or homicidal ideation.  He also denies auditory and visual hallucinations.  He does however endorse persecutory delusions, specifically saying, "people are out to get me."  He continues to endorse severely depressed mood, hopelessness, and feeling worthless.  He also endorses poor sleep and poor appetite.  The patient has a history of MDD, alcohol use disorder, and cocaine use disorder.  He reports to be taking his psychiatric medications as scheduled.  Potential diagnosis: decompensation of MDD vs symptoms associated with substance withdrawal.  Upon chart review, the patient has endorsed persecutory delusions in the past, and his history also mentions schizoaffective disorder, though he is not currently on any antipsychotic medications.  It might prove worthwhile to investigate these experiences further in the outpatient setting and consider adding psychotropic medications related to psychosis.  At the time of this writing, I was unable to reach his provider  at [facility] to verbally update him of the patient's stay.   Plan - discharge patient to home with psychiatry and therapy follow-up - discharge patient with resources - CONTINUE cariprazine 3 mg PO daily - CONTINUE lamotrigine 150 mg PO twice daily - CONTINUE hydroxyzine 25 mg Q6 PO PRN  Activity:  as tolerated Diet:  regular  Disposition: patient will go back to motel  where he currently resides with fiance  I discussed my assessment and planned treatment for the patient with Dr. Lucianne Muss who agrees with my formulated course of action.  Augusto Gamble, MD 09/13/2021, 5:05 PM

## 2021-09-13 NOTE — ED Notes (Signed)
Pt was given juice and a muffin for breakfast.

## 2021-09-13 NOTE — BH Assessment (Signed)
Comprehensive Clinical Assessment (CCA) Note  09/13/2021 Chris Olson. [id number]  Disposition: Rockney Ghee, NP recommends the pt to be admitted to [facility] for Continuous Assessment.   The patient demonstrates the following risk factors for suicide: Chronic risk factors for suicide include: psychiatric disorder of Major Depressive Disorder, recurrent, severe without psychotic features . Acute risk factors for suicide include:  Pt denies, SI . Protective factors for this patient include: positive social support. Considering these factors, the overall suicide risk at this point appears to be . Patient is appropriate for outpatient follow up.  Chris Olson is a 31 year old male who presents voluntary and unaccompanied to [facility]. Clinician asked the pt, "what brought you to the hospital?" Pt reports, he's going through a lot with lady friend, he feels like nobody care, what he says doesn't matter, he just wants to be left alone. Pt reports, he can't control his emotions, he gets down, depressed and cries sometimes. Pt reports, he feels like giving up, he doesn't care what happens. Pt reports, he can't be still and paces a lot. Pt reports, he's paranoid, can't be around other people, feel like people are talking about him. Pt reports, he called the police and as brought here. Pt denies, SI, HI, AVH, self-injurious behaviors and access to weapons.   Pt reports, using "little bit" of Cocaine last night. Pt reports, drinking 60oz of beer and a cup of liquor last night. Pt reports, smoking half a bowl pack 2-3 days ago. Pt reports, he's linked to [facility] for medication management. Pt reports, he next appointment is scheduled for 09/22/2021 and he takes his medications as prescribed. Pt reports, previous inpatient and [facility] admissions.   Pt presents quiet, awake with normal speech. Pt's mood, affect was depressed. Pt's insight, judgement was fair. Pt reports, he does not want to be around no  one. Pt's reports, he can contract for safety.   Diagnosis: Major Depressive Disorder, recurrent, severe without psychotic features.   *Pt consented for clinician and NP to contact his girlfriend Psychologist, occupational Section, [phone]) to gather additional information. Pt's girlfriend reports, the pt told her he was going for a walk to clear his mind then I called them. Per girlfriend, the pt was calm then got agitated, she's worried about him. Pt's girlfriend reports, she has no concerns of SI and HI with the pt, pt's taking medications, he has "Schizo tendencies." Pt's girlfriend then reports, she recommends the pt to be put on a 72 hour hold if she can talk to him.*   Chief Complaint:  Chief Complaint  Patient presents with   Depression   Visit Diagnosis:     CCA Screening, Triage and Referral (STR)  Patient Reported Information How did you hear about Chris Olson? Legal System  What Is the Reason for Your Visit/Call Today? Pt presents to [facility] voluntarily, accompanied by [employer] with complaints of depression. Pt reports being at the [employer] [place] station because he just wanted to get away and clear his head. Pt called [employer] because he was feeling depressed and felt that nobody cares about him and feels unloved. Pt reports feeling this way for a few months. Pt states" I keep things bottled up cause I feel like my emotions and feelings don't matter". Pt has hx of schizoaffective disorder and is prescribed Vraylar, Hydroxyzine and Lamotrigine. Pt is followed by psychiatrist at [facility].  How Long Has This Been Causing You Problems? 1-6 months  What Do You Feel Would Help You the Most  Today? Treatment for Depression or other mood problem   Have You Recently Had Any Thoughts About Hurting Yourself? No  Are You Planning to Commit Suicide/Harm Yourself At This time? No   Have you Recently Had Thoughts About Hurting Someone Chris Olson? No  Are You Planning to Harm Someone at This Time? No  Explanation: No data  recorded  Have You Used Any Alcohol or Drugs in the Past 24 Hours? Yes  How Long Ago Did You Use Drugs or Alcohol? No data recorded What Did You Use and How Much? alcohol (beer and liquor) and cocaine, but unsure of amount of both used   Do You Currently Have a Therapist/Psychiatrist? No data recorded Name of Therapist/Psychiatrist: No data recorded  Have You Been Recently Discharged From Any Office Practice or Programs? No data recorded Explanation of Discharge From Practice/Program: No data recorded    CCA Screening Triage Referral Assessment Type of Contact: No data recorded Telemedicine Service Delivery:   Is this Initial or Reassessment? No data recorded Date Telepsych consult ordered in [facility]:  No data recorded Time Telepsych consult ordered in [facility]:  No data recorded Location of Assessment: No data recorded Provider Location: No data recorded  Collateral Involvement: No data recorded  Does Patient Have a Court Appointed Legal Guardian? No data recorded Name and Contact of Legal Guardian: No data recorded If Minor and Not Living with Parent(s), Who has Custody? No data recorded Is CPS involved or ever been involved? No data recorded Is APS involved or ever been involved? No data recorded  Patient Determined To Be At Risk for Harm To Self or Others Based on Review of Patient Reported Information or Presenting Complaint? No data recorded Method: No data recorded Availability of Means: No data recorded Intent: No data recorded Notification Required: No data recorded Additional Information for Danger to Others Potential: No data recorded Additional Comments for Danger to Others Potential: No data recorded Are There Guns or Other Weapons in Your Home? No data recorded Types of Guns/Weapons: No data recorded Are These Weapons Safely Secured?                            No data recorded Who Could Verify You Are Able To Have These Secured: No data recorded Do You Have any  Outstanding Charges, Pending Court Dates, Parole/Probation? No data recorded Contacted To Inform of Risk of Harm To Self or Others: No data recorded   Does Patient Present under Involuntary Commitment? No data recorded IVC Papers Initial File Date: No data recorded  [place] of Residence: No data recorded  Patient Currently Receiving the Following Services: No data recorded  Determination of Need: Routine (7 days)   Options For Referral: Other: Comment; Outpatient Therapy; Chemical Dependency Intensive Outpatient Therapy (CDIOP)     CCA Biopsychosocial Patient Reported Schizophrenia/Schizoaffective Diagnosis in Past: No data recorded  Strengths: No data recorded  Mental Health Symptoms Depression:   Irritability; Increase/decrease in appetite; Hopelessness; Sleep (too much or little); Change in energy/activity; Difficulty Concentrating; Fatigue; Worthlessness; Tearfulness (Despondent, isolation, guilt/blame.)   Duration of Depressive symptoms:  Duration of Depressive Symptoms: Greater than two weeks   Mania:   Racing thoughts   Anxiety:    Worrying; Tension   Psychosis:   None   Duration of Psychotic symptoms:    Trauma:   None   Obsessions:   None   Compulsions:   None   Inattention:   Disorganized; Forgetful; Loses  things   Hyperactivity/Impulsivity:   Feeling of restlessness; Fidgets with hands/feet   Oppositional/Defiant Behaviors:   Angry   Emotional Irregularity:  No data recorded  Other Mood/Personality Symptoms:   Pt is very depressed.    Mental Status Exam Appearance and self-care  Stature:   Average   Weight:   Average weight   Clothing:   Casual   Grooming:   Normal   Cosmetic use:   None   Posture/gait:   Normal   Motor activity:  No data recorded  Sensorium  Attention:   Normal   Concentration:   Normal   Orientation:   X5   Recall/memory:   Normal   Affect and Mood  Affect:   Depressed   Mood:    Depressed   Relating  Eye contact:   Normal   Facial expression:   Depressed   Attitude toward examiner:   Cooperative   Thought and Language  Speech flow:  Normal   Thought content:   Appropriate to Mood and Circumstances   Preoccupation:   None   Hallucinations:   None   Organization:  No data recorded  [employer] of Knowledge:   Fair   Intelligence:   Average   Abstraction:  No data recorded  Judgement:   Fair   Reality Testing:  No data recorded  Insight:   Fair   Decision Making:  No data recorded  Social Functioning  Social Maturity:   Isolates   Social Judgement:   "Street Smart"   Stress  Stressors:   Other (Comment) (Pt reports, feeling nobodt cares, want to be left alone.)   Coping Ability:   Overwhelmed   Skill Deficits:   Communication   Supports:   Family     Religion: Religion/Spirituality Are You A Religious Person?: No  Leisure/Recreation: Leisure / Recreation Do You Have Hobbies?: No  Exercise/Diet: Exercise/Diet Do You Exercise?: No Do You Follow a Special Diet?: No Do You Have Any Trouble Sleeping?: Yes Explanation of Sleeping Difficulties: Pt reports, trouble sleeping sometimes.   CCA Employment/Education Employment/Work Situation: Employment / Work Situation Employment Situation: On disability Why is Patient on Disability: Schizoaffective Disorder. Has Patient ever Been in the Military?: No  Education: Education Is Patient Currently Attending School?: No Last Grade Completed: 12 Did You Attend College?: No   CCA Family/Childhood History Family and Relationship History: Family history Marital status: Single Does patient have children?: Yes How many children?: 1 How is patient's relationship with their children?: Pt reports, the mother of his daughter does not want him in her life. Pt reports, he has not seen his daughter in years.  Childhood History:  Childhood History By whom  was/is the patient raised?:  (UTA) Did patient suffer any verbal/emotional/physical/sexual abuse as a child?: No Did patient suffer from severe childhood neglect?: No Has patient ever been sexually abused/assaulted/raped as an adolescent or adult?: No Was the patient ever a victim of a crime or a disaster?: No Witnessed domestic violence?: No  Child/Adolescent Assessment:     CCA Substance Use Alcohol/Drug Use: Alcohol / Drug Use Pain Medications: See MAR Prescriptions: See MAR Over the Counter: See MAR History of alcohol / drug use?: Yes Substance #1 Name of Substance 1: Cocaine. 1 - Age of First Use: UTA 1 - Amount (size/oz): Pt reports, "little bit." 1 - Frequency: UTA 1 - Duration: Ongoing. 1 - Last Use / Amount: Last night. 1 - Method of Aquiring: Purchase. 1- Route of Use:  UTA  ASAM's:  Six Dimensions of Multidimensional Assessment  Dimension 1:  Acute Intoxication and/or Withdrawal Potential:      Dimension 2:  Biomedical Conditions and Complications:      Dimension 3:  Emotional, Behavioral, or Cognitive Conditions and Complications:     Dimension 4:  Readiness to Change:     Dimension 5:  Relapse, Continued use, or Continued Problem Potential:     Dimension 6:  Recovery/Living Environment:     ASAM Severity Score:    ASAM Recommended Level of Treatment:     Substance use Disorder (SUD)    Recommendations for Services/Supports/Treatments: Recommendations for Services/Supports/Treatments Recommendations For Services/Supports/Treatments: Other (Comment) ([facility]  Discharge Disposition:    DSM5 Diagnoses: Patient Active Problem List   Diagnosis Date Noted   Severe recurrent major depression without psychotic features (HCC) 08/23/2019   Cocaine abuse with cocaine-induced mood disorder (HCC) 11/11/2017     Referrals to Alternative Service(s): Referred to Alternative Service(s):   Place:   Date:   Time:     Referred to Alternative Service(s):   Place:   Date:   Time:    Referred to Alternative Service(s):   Place:   Date:   Time:    Referred to Alternative Service(s):   Place:   Date:   Time:     Redmond Pullingreylese D Aldous Housel, [facility] Comprehensive Clinical Assessment (CCA) Screening, Triage and Referral Note  09/13/2021 Chris CrumbleLendell Mccauley Jr. [id number]  Chief Complaint:  Chief Complaint  Patient presents with   Depression   Visit Diagnosis:   Patient Reported Information How did you hear about us? Legal System  What Is the Reason for Your Visit/Call Today? [facility] by [employer] with complaints of depression. Pt reports being at the [employer] [place] station because he just wanted to get away and clear his head. Pt called [employer] because he was feeling depressed and felt that nobody cares about him and feels unloved. Pt reports feeling this way for a few months. Pt states" I keep things bottled up cause I feel like my emotions and feelings don't matter". Pt has hx of schizoaffective disorder and is prescribed Vraylar, Hydroxyzine and Lamotrigine. [facility].  How Long Has This Been Causing You Problems? 1-6 months  What Do You Feel Would Help You the Most Today? Treatment for Depression or other mood problem   Have You Recently Had Any Thoughts About Hurting Yourself? No  Are You Planning to Commit Suicide/Harm Yourself At This time? No   Have you Recently Had Thoughts About Hurting Someone Chris Ohslse? No  Are You Planning to Harm Someone at This Time? No  Explanation: No data recorded  Have You Used Any Alcohol or Drugs in the Past 24 Hours? Yes  How Long Ago Did You Use Drugs or Alcohol? No data recorded What Did You Use and How Much? alcohol (beer and liquor) and cocaine, but unsure of amount of both used   Do You Currently Have a Therapist/Psychiatrist? No data recorded Name of Therapist/Psychiatrist: No data recorded  Have You Been Recently  Discharged From Any Office Practice or Programs? No data recorded Explanation of Discharge From Practice/Program: No data recorded   CCA Screening Triage Referral Assessment Type of Contact: No data recorded Telemedicine Service Delivery:   Is this Initial or Reassessment? No data recorded Date Telepsych consult ordered in [facility]:  No data recorded Time Telepsych consult ordered in [facility]:  No data recorded Location of Assessment: No data recorded Provider Location: No data recorded  Collateral Involvement: No data recorded  Does Patient Have a Court Appointed Legal Guardian? No data recorded Name and Contact of Legal Guardian: No data recorded If Minor and Not Living with Parent(s), Who has Custody? No data recorded Is CPS involved or ever been involved? No data recorded Is APS involved or ever been involved? No data recorded  Patient Determined To Be At Risk for Harm To Self or Others Based on Review of Patient Reported Information or Presenting Complaint? No data recorded Method: No data recorded Availability of Means: No data recorded Intent: No data recorded Notification Required: No data recorded Additional Information for Danger to Others Potential: No data recorded Additional Comments for Danger to Others Potential: No data recorded Are There Guns or Other Weapons in Your Home? No data recorded Types of Guns/Weapons: No data recorded Are These Weapons Safely Secured?                            No data recorded Who Could Verify You Are Able To Have These Secured: No data recorded Do You Have any Outstanding Charges, Pending Court Dates, Parole/Probation? No data recorded Contacted To Inform of Risk of Harm To Self or Others: No data recorded  Does Patient Present under Involuntary Commitment? No data recorded IVC Papers Initial File Date: No data recorded  [place] of Residence: No data recorded  Patient Currently Receiving the Following Services: No data  recorded  Determination of Need: Routine (7 days)   Options For Referral: Other: Comment; Outpatient Therapy; Chemical Dependency Intensive Outpatient Therapy (CDIOP)   Discharge Disposition:     Redmond Pulling, [facility]       Redmond Pulling, MS, [facility] Triage Specialist [phone]

## 2021-09-13 NOTE — ED Notes (Signed)
Patient A&O x 4, ambulatory. Patient discharged in no acute distress. Patient denied SI/HI, A/VH upon discharge. Patient verbalized understanding of all discharge instructions explained by staff, to include follow up appointments, medication changes discussed and safety plan. Patient reported mood 10/10.  Pt belongings returned to patient from locker # 27  intact. Patient escorted to lobby via staff for transport to destination. Safety maintained.

## 2021-09-13 NOTE — ED Notes (Signed)
Pt brought to [facility] by [employer]. Pt here voluntarily. Pt very depressed and just needed to get away to clear his head. Eluded to possible relationship issues. Pt denies SI, HI, AVH and pain. Pt denies anxiety and rates depression 8/10. Pt prescribed Vraylar, Vistaril and Lamotrigine. Pt states he also drinks about (2) 40 oz beers daily. Last drink was tonight before coming to [facility]. Pt denies any withdrawal symptoms at this time. Pt UDS was positive for cocaine and THC. Last cocaine use was a couple hours before admission. Last marijuana use was a couple days ago. Pt smokes 1 ppd cigarettes but denies needing nicotine replacement. Pt cooperative during assessment. Pt skin search revealed no marks, scars or tattoos. Pt introduced to unit. Fluids and food offered and fluids accepted. Pt laying in bed. Pt safe on unit.

## 2021-09-13 NOTE — ED Notes (Signed)
Patient A&Ox4. Denies SI/HI when asked. Denies A/VH. Patient denies any physical complaints when asked. No acute distress noted. Support and encouragement provided. Routine safety checks conducted according to facility protocol. Encouraged patient to notify staff if thoughts of harm toward self or others arise. Patient verbalize understanding and agreement. Will continue to monitor for safety.     

## 2021-09-13 NOTE — ED Triage Notes (Addendum)
Pt presents to [facility] voluntarily, accompanied by [employer] with complaints of depression. Pt reports being at the [employer] [place] because he just wanted to get away and clear his head. Pt called [employer] because he was feeling depressed and felt that nobody cares about him and feels unloved. Pt reports feeling this way for a few months. Pt states" I keep things bottled up cause I feel like my emotions and feelings don't matter". Pt has hx of schizoaffective disorder and is prescribed Vraylar, Hydroxyzine and Lamotrigine. Pt is followed by psychiatrist at [facility]. Pt denies SI, HI, AVH at this time.
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
## 2021-09-13 NOTE — ED Notes (Signed)
Patient resting quietly in bed with eyes closed. Respirations equal and unlabored, skin warm and dry, NAD. No change in assessment or acuity. Routine safety checks conducted according to facility protocol. Will continue to monitor for safety.   

## 2021-09-13 NOTE — ED Notes (Signed)
MHT and RN offered meal upon arrival. He declined.

## 2022-01-03 ENCOUNTER — Ambulatory Visit (HOSPITAL_COMMUNITY)
Admission: EM | Admit: 2022-01-03 | Discharge: 2022-01-03 | Disposition: A | Discharge status: Home / Self Care | Payer: Medicaid Other | Attending: Family Medicine | Admitting: Family Medicine

## 2022-01-03 ENCOUNTER — Encounter (HOSPITAL_COMMUNITY): Payer: Self-pay

## 2022-01-03 DIAGNOSIS — Z113 Encounter for screening for infections with a predominantly sexual mode of transmission: Secondary | ICD-10-CM

## 2022-01-03 DIAGNOSIS — N50811 Right testicular pain: Secondary | ICD-10-CM | POA: Diagnosis present

## 2022-01-03 LAB — HIV ANTIBODY (ROUTINE TESTING W REFLEX): HIV Screen 4th Generation wRfx: NONREACTIVE

## 2022-01-03 NOTE — Discharge Instructions (Signed)
Staff will notify you have any positive tests on your swab  Try ibuprofen 200 mg over-the-counter--take 2 every 6 hours as needed for pain  Make sure you are drinking enough fluids  If this pain becomes suddenly more intense and you cannot sit down or be still because of the pain, please proceed to the emergency room for urgent evaluation

## 2022-01-03 NOTE — ED Provider Notes (Addendum)
[facility]    CSN: [id number] Arrival date & time: 01/03/22  1549      History   Chief Complaint Chief Complaint  Patient presents with   Testicle Pain    HPI Chris Olson. is a 31 y.o. male.    Testicle Pain   Here for right testicle pain that is been going on for about 2 days.  It can come and go.  It is described as an aching and sometimes being sensitive.  No swelling or redness.  No fever or chills or nausea or vomiting or abdominal pain.  Past Medical History:  Diagnosis Date   Anxiety    Chest pain    Depression    Pericarditis    Schizophrenia [facility])     Patient Active Problem List   Diagnosis Date Noted   Polysubstance abuse (HCC) 09/13/2021   Severe recurrent major depression without psychotic features (HCC) 08/23/2019   Cocaine abuse with cocaine-induced mood disorder (HCC) 11/11/2017    History reviewed. No pertinent surgical history.     Home Medications    Prior to Admission medications   Medication Sig Start Date End Date Taking? Authorizing Provider  cariprazine (VRAYLAR) 3 MG capsule Take 3 mg by mouth daily.    [provider]  hydrOXYzine (ATARAX/VISTARIL) 25 MG tablet Take 1 tablet (25 mg total) by mouth every 6 (six) hours as needed for anxiety. Patient taking differently: Take 25 mg by mouth 3 (three) times daily as needed (For anxiety, agitation or panic attacks). 08/29/19   Armandina Stammer I, NP  lamoTRIgine (LAMICTAL) 150 MG tablet Take 150 mg by mouth 2 (two) times daily.    [provider]    Family History History reviewed. No pertinent family history.  Social History Social History   Tobacco Use   Smoking status: Every Day    Packs/day: 0.75    Types: Cigarettes   Smokeless tobacco: Never  Vaping Use   Vaping Use: Never used  Substance Use Topics   Alcohol use: Yes    Alcohol/week: 15.0 standard drinks of alcohol    Types: 15 Cans of beer per week    Comment: (2) 40 oz beers per day  most days; last drink tonight   Drug use: Yes    Types: Marijuana, Cocaine    Comment: last use coc - hours before arrival; last use marijuana: 2 days ago     Allergies   Patient has no known allergies.   Review of Systems Review of Systems  Genitourinary:  Positive for testicular pain.     Physical Exam Triage Vital Signs ED Triage Vitals  Enc Vitals Group     BP 01/03/22 1607 124/81     Pulse Rate 01/03/22 1607 84     Resp 01/03/22 1607 12     Temp 01/03/22 1607 98.3 F (36.8 C)     Temp Source 01/03/22 1607 Oral     SpO2 01/03/22 1607 98 %     Weight --      Height --      Head Circumference --      Peak Flow --      Pain Score 01/03/22 1604 8     Pain Loc --      Pain Edu? --      Excl. in GC? --    No data found.  Updated Vital Signs BP 124/81 (BP Location: Left Arm)   Pulse 84   Temp 98.3 F (36.8 C) (  Oral)   Resp 12   SpO2 98%   Visual Acuity Right Eye Distance:   Left Eye Distance:   Bilateral Distance:    Right Eye Near:   Left Eye Near:    Bilateral Near:     Physical Exam Vitals reviewed.  Constitutional:      General: He is not in acute distress.    Appearance: He is not ill-appearing, toxic-appearing or diaphoretic.  HENT:     Mouth/Throat:     Mouth: Mucous membranes are moist.  Eyes:     Extraocular Movements: Extraocular movements intact.     Pupils: Pupils are equal, round, and reactive to light.  Cardiovascular:     Rate and Rhythm: Normal rate and regular rhythm.  Pulmonary:     Effort: Pulmonary effort is normal.     Breath sounds: Normal breath sounds.  Abdominal:     Palpations: Abdomen is soft.     Tenderness: There is no abdominal tenderness.  Musculoskeletal:     Cervical back: Neck supple.  Lymphadenopathy:     Cervical: No cervical adenopathy.  Skin:    Coloration: Skin is not jaundiced or pale.  Neurological:     Mental Status: He is alert and oriented to person, place, and time.  Psychiatric:         Behavior: Behavior normal.      UC Treatments / Results  Labs (all labs ordered are listed, but only abnormal results are displayed) Labs Reviewed  HIV ANTIBODY (ROUTINE TESTING W REFLEX)  RPR  CYTOLOGY, (ORAL, ANAL, URETHRAL) ANCILLARY ONLY    EKG   Radiology No results found.  Procedures Procedures (including critical care time)  Medications Ordered in UC Medications - No data to display  Initial Impression / Assessment and Plan / UC Course  I have reviewed the triage vital signs and the nursing notes.  Pertinent labs & imaging results that were available during my care of the patient were reviewed by me and considered in my medical decision making (see chart for details).        Swab was done and we will notify him and treat per protocol any positives.  This does not seem to be an intense pain that he has been having and is not consistent with torsion.  We will await results before deciding on any treatment Final Clinical Impressions(s) / UC Diagnoses   Final diagnoses:  Pain in right testicle  Screen for STD (sexually transmitted disease)     Discharge Instructions      Staff will notify you have any positive tests on your swab  Try ibuprofen 200 mg over-the-counter--take 2 every 6 hours as needed for pain  Make sure you are drinking enough fluids  If this pain becomes suddenly more intense and you cannot sit down or be still because of the pain, please proceed to the emergency room for urgent evaluation     ED Prescriptions   None    PDMP not reviewed this encounter.   Zenia Resides, MD 01/03/22 1626    Zenia Resides, MD 01/03/22 [id number]

## 2022-01-03 NOTE — ED Triage Notes (Signed)
Pt is here for pain in the testicles x2days, pt would like to be tested for STD.

## 2022-01-04 LAB — CYTOLOGY, (ORAL, ANAL, URETHRAL) ANCILLARY ONLY
Chlamydia: NEGATIVE
Comment: NEGATIVE
Comment: NEGATIVE
Comment: NORMAL
Neisseria Gonorrhea: NEGATIVE
Trichomonas: NEGATIVE

## 2022-01-04 LAB — RPR: RPR Ser Ql: NONREACTIVE

## 2022-04-09 ENCOUNTER — Ambulatory Visit: Payer: Medicaid Other | Admitting: Family Medicine

## 2022-04-10 ENCOUNTER — Ambulatory Visit: Payer: Medicaid Other | Admitting: Family Medicine

## 2022-04-28 ENCOUNTER — Emergency Department (HOSPITAL_COMMUNITY)
Admission: EM | Admit: 2022-04-28 | Discharge: 2022-04-28 | Disposition: A | Discharge status: Home / Self Care | Payer: Medicaid Other | Attending: Emergency Medicine | Admitting: Emergency Medicine

## 2022-04-28 ENCOUNTER — Encounter (HOSPITAL_COMMUNITY): Payer: Self-pay

## 2022-04-28 DIAGNOSIS — N5312 Painful ejaculation: Secondary | ICD-10-CM | POA: Diagnosis present

## 2022-04-28 DIAGNOSIS — Z113 Encounter for screening for infections with a predominantly sexual mode of transmission: Secondary | ICD-10-CM | POA: Diagnosis not present

## 2022-04-28 LAB — HIV ANTIBODY (ROUTINE TESTING W REFLEX): HIV Screen 4th Generation wRfx: NONREACTIVE

## 2022-04-28 LAB — URINALYSIS, ROUTINE W REFLEX MICROSCOPIC
Bilirubin Urine: NEGATIVE
Glucose, UA: NEGATIVE mg/dL
Hgb urine dipstick: NEGATIVE
Ketones, ur: NEGATIVE mg/dL
Leukocytes,Ua: NEGATIVE
Nitrite: NEGATIVE
Protein, ur: NEGATIVE mg/dL
Specific Gravity, Urine: 1.011 (ref 1.005–1.030)
pH: 5 (ref 5.0–8.0)

## 2022-04-28 MED ORDER — CEFTRIAXONE SODIUM 1 G IJ SOLR
500.0000 mg | Freq: Once | INTRAMUSCULAR | Status: AC
  Administered 2022-04-28: 500 mg via INTRAMUSCULAR
  Filled 2022-04-28: qty 10

## 2022-04-28 MED ORDER — AZITHROMYCIN 1 G PO PACK
1.0000 g | PACK | Freq: Once | ORAL | Status: AC
  Administered 2022-04-28: 1 g via ORAL
  Filled 2022-04-28: qty 1

## 2022-04-28 NOTE — Discharge Instructions (Addendum)
Please call your primary care provider I have attached here for you to be reevaluated in the next few days.  Please check your MyChart app as the results should come in the next few days.  Please monitor symptoms and if symptoms worsen please return to ER.

## 2022-04-28 NOTE — ED Provider Notes (Signed)
[place] Provider Note   CSN: [id number] Arrival date & time: 04/28/22  1019     History  Chief Complaint  Patient presents with   Male GU Problem    Chris Olson. is a 32 y.o. male history of gonorrhea/chlamydia presented with a week of burning when he ejaculates.  Patient states he has 2 partners and does not use a condom.  Patient denied any lesions along his penis or lymph nodes.  Patient denied testicular Olson or blood in his ejaculate.  Patient stated he wanted be tested for gonorrhea chlamydia, syphilis, HIV.  Patient denies chest Olson, shortness of breath, Chris Olson, back Olson, fevers, changes in sensation, hematuria  Home Medications Prior to Admission medications   Medication Sig Start Date End Date Taking? Authorizing Provider  cariprazine (VRAYLAR) 3 MG capsule Take 3 mg by mouth daily.    [provider]  hydrOXYzine (ATARAX/VISTARIL) 25 MG tablet Take 1 tablet (25 mg total) by mouth every 6 (six) hours as needed for anxiety. Patient taking differently: Take 25 mg by mouth 3 (three) times daily as needed (For anxiety, agitation or panic attacks). 08/29/19   Lindell Spar I, NP  lamoTRIgine (LAMICTAL) 150 MG tablet Take 150 mg by mouth 2 (two) times daily.    [provider]      Allergies    Patient has no known allergies.    Review of Systems   Review of Systems See HPI Physical Exam Updated Vital Signs BP 123/82 (BP Location: Right Arm)   Pulse (!) 106   Temp 98.5 F (36.9 C) (Oral)   Resp 18   SpO2 100%  Physical Exam Exam conducted with a chaperone present.  Constitutional:      General: He is not in acute distress.    Appearance: He is normal weight.  Abdominal:     Hernia: There is no hernia in the left inguinal area or right inguinal area.  Genitourinary:    Penis: Normal and circumcised. No tenderness, discharge, swelling or lesions.      Testes: Normal. Cremasteric reflex  is present.        Right: Mass, tenderness or swelling not present.        Left: Mass, tenderness or swelling not present.     Epididymis:     Right: Normal.     Left: Normal.     Comments: Chaperone: Chris Olson Lymphadenopathy:     Lower Body: No right inguinal adenopathy. No left inguinal adenopathy.  Neurological:     Mental Status: He is alert.     ED Results / Procedures / Treatments   Labs (all labs ordered are listed, but only abnormal results are displayed) Labs Reviewed  URINALYSIS, ROUTINE W REFLEX MICROSCOPIC  RPR  HIV ANTIBODY (ROUTINE TESTING W REFLEX)  GC/CHLAMYDIA PROBE AMP ([place]) NOT AT [facility]    EKG None  Radiology No results found.  Procedures Procedures    Medications Ordered in ED Medications  azithromycin (ZITHROMAX) powder 1 g (has no administration in time range)  cefTRIAXone (ROCEPHIN) injection 500 mg (has no administration in time range)    ED Course/ Medical Decision Making/ A&P                             Medical Decision Making Amount and/or Complexity of Data Reviewed Labs: ordered.   Chris Olson. 32 y.o. presented today for Olson  when he ejaculates. Working DDx that I considered at this time includes, but not limited to, STI, Testicular Olson, Hernia.  Review of prior external notes: 01/03/2022 ED  Unique Tests and My Interpretation:  UA: Unremarkable GC: Pending RPR: Pending HIV: pending  Discussion with Independent Historian: None  Discussion of Management of Tests: None  Risk:    Medium:  - prescription drug management  Risk Stratification Score: None  R/o DDx: Chris Olson: Patient denied any testicular Olson and Olson has been off for a week, if this was Olson I would expect the patient to come in sooner and to have the Olson exquisite Hernia: No hernia palpated on exam Epididymitis: Patient was not tender in the epididymis Testicular mass: No mass palpated  Plan: Patient presented for  burning when he ejaculates.  On exam patient did not appear to be in distress and stable vitals.  During the physical exam no abnormalities were noted the genitals however patient will be swabbed and tested for STIs including syphilis and HIV per his request.  If urine is negative patient will be discharged and encouraged to follow-up with Chris Olson.  Urine negative for any UTI.  Due to the patient's psych history and social practices I feel is best to empirically treat him with azithromycin and Rocephin here.  Patient will be discharged and encouraged to follow-up with his results on Chris Olson and schedule a primary care provider appointment to be reevaluated.  Patient was given return precautions.patient stable for discharge at this time.  Patient verbalized understanding of plan.         Final Clinical Impression(s) / ED Diagnoses Final diagnoses:  Screening examination for STD (sexually transmitted disease)    Rx / DC Orders ED Discharge Orders     None         Elvina Sidle 04/28/22 1200    Charlesetta Shanks, MD 04/28/22 1241

## 2022-04-28 NOTE — ED Triage Notes (Signed)
Pt arrived via POV, c/o pain with ejaculation. States concerned for STD or UTI

## 2022-04-29 LAB — RPR: RPR Ser Ql: NONREACTIVE

## 2022-04-30 LAB — GC/CHLAMYDIA PROBE AMP (~~LOC~~) NOT AT ARMC
Chlamydia: NEGATIVE
Comment: NEGATIVE
Comment: NORMAL
Neisseria Gonorrhea: NEGATIVE

## 2022-11-19 ENCOUNTER — Ambulatory Visit (INDEPENDENT_AMBULATORY_CARE_PROVIDER_SITE_OTHER): Payer: MEDICAID | Admitting: Infectious Diseases

## 2022-11-19 ENCOUNTER — Other Ambulatory Visit: Payer: Self-pay

## 2022-11-19 ENCOUNTER — Encounter: Payer: Self-pay | Admitting: Infectious Diseases

## 2022-11-19 ENCOUNTER — Other Ambulatory Visit (HOSPITAL_COMMUNITY)
Admission: RE | Admit: 2022-11-19 | Discharge: 2022-11-19 | Disposition: A | Discharge status: Home / Self Care | Payer: MEDICAID | Source: Ambulatory Visit | Attending: Infectious Diseases | Admitting: Infectious Diseases

## 2022-11-19 VITALS — BP 119/82 | HR 78 | Temp 98.6°F | Ht 68.0 in | Wt 134.0 lb

## 2022-11-19 DIAGNOSIS — Z202 Contact with and (suspected) exposure to infections with a predominantly sexual mode of transmission: Secondary | ICD-10-CM | POA: Insufficient documentation

## 2022-11-19 MED ORDER — METRONIDAZOLE 500 MG PO TABS
2000.0000 mg | ORAL_TABLET | Freq: Once | ORAL | 0 refills | Status: AC
  Filled 2022-11-19: qty 4, 1d supply, fill #0

## 2022-11-19 NOTE — Progress Notes (Signed)
Subjective:     Chris Conteh. is a 32 y.o. male patient here today with concern over STI exposure/symptoms .   Girlfriend who joins him today was recently positive for trichomonas through urine testing.  Would like full screening and treatment for the same today.  Does not have a primary care team yet.    Review of Systems: Review of Systems  Constitutional:  Negative for chills and fever.  HENT:  Negative for sore throat.   Eyes:  Negative for visual disturbance.  Gastrointestinal:  Negative for abdominal pain, anal bleeding and rectal pain.  Genitourinary:  Negative for dysuria, genital sores, penile discharge, penile pain, scrotal swelling and testicular pain.  Musculoskeletal:  Negative for arthralgias and joint swelling.  Skin:  Negative for rash.  Neurological:  Negative for headaches.  Hematological:  Negative for adenopathy.    Sexual History:  Prefers male partners, oropharyngeal & genital sites exposed    Past Medical History:  Diagnosis Date   Anxiety    Chest pain    Depression    Pericarditis    Schizophrenia (HCC)     Outpatient Medications Prior to Visit  Medication Sig Dispense Refill   cariprazine (VRAYLAR) 3 MG capsule Take 3 mg by mouth daily.     lamoTRIgine (LAMICTAL) 150 MG tablet Take 150 mg by mouth 2 (two) times daily.     hydrOXYzine (ATARAX/VISTARIL) 25 MG tablet Take 1 tablet (25 mg total) by mouth every 6 (six) hours as needed for anxiety. (Patient not taking: Reported on 11/19/2022) 75 tablet 0   No facility-administered medications prior to visit.    No Known Allergies  No family history on file.      Objective:  Objective  Vitals:   11/19/22 1412  BP: 119/82  Pulse: 78  Temp: 98.6 F (37 C)  SpO2: 99%   Body mass index is 20.37 kg/m.   Physical Exam  Physical Exam Vitals reviewed.  Constitutional:      Appearance: Normal appearance. He is not ill-appearing.  HENT:     Head: Normocephalic.      Mouth/Throat:     Mouth: Mucous membranes are moist.     Pharynx: Oropharynx is clear.  Eyes:     General: No scleral icterus. Cardiovascular:     Rate and Rhythm: Normal rate and regular rhythm.  Pulmonary:     Effort: Pulmonary effort is normal.  Musculoskeletal:        General: Normal range of motion.     Cervical back: Normal range of motion.  Skin:    Coloration: Skin is not jaundiced or pale.  Neurological:     Mental Status: He is alert and oriented to person, place, and time.  Psychiatric:        Mood and Affect: Mood normal.        Judgment: Judgment normal.      Assessment & Plan:    Problem List Items Addressed This Visit       Unprioritized   Trichomonas exposure - Primary    Treat with 1 time dose of metronidazole 2000 mg . Urine cytology today with GC/CT/TV. Serum HIV and syphilis test.  Return to clinic for repeat urine sample if positive. Otherwise can return PRN for sexual health concerns.  Information provided for primary care to establish and answer more of cancer related screening questions he has.       Relevant Medications   metroNIDAZOLE (FLAGYL) 500 MG tablet  Other Relevant Orders   Urine cytology ancillary only   HIV antibody (with reflex)   RPR    Rexene Alberts, MSN, NP-C [facility] Office: [phone] Pager: [phone]

## 2022-11-19 NOTE — Patient Instructions (Addendum)
For primary care services, please call one of the following clinics for a new patient appointment:   [facility] - [address] [place].  [phone] - 3rd floor   If your testing comes back positive we will have you back to repeat a urine sample.   Take the metronidazole all 4 tabs at once with food.

## 2022-11-19 NOTE — Assessment & Plan Note (Addendum)
Treat with 1 time dose of metronidazole 2000 mg . Urine cytology today with GC/CT/TV. Serum HIV and syphilis test.  Return to clinic for repeat urine sample if positive. Otherwise can return PRN for sexual health concerns.  Information provided for primary care to establish and answer more of cancer related screening questions he has.

## 2022-11-20 LAB — URINE CYTOLOGY ANCILLARY ONLY
Chlamydia: NEGATIVE
Comment: NEGATIVE
Comment: NEGATIVE
Comment: NORMAL
Neisseria Gonorrhea: NEGATIVE
Trichomonas: NEGATIVE

## 2022-11-20 LAB — HIV ANTIBODY (ROUTINE TESTING W REFLEX): HIV 1&2 Ab, 4th Generation: NONREACTIVE

## 2022-11-20 LAB — RPR: RPR Ser Ql: NONREACTIVE

## 2022-12-16 ENCOUNTER — Emergency Department (HOSPITAL_BASED_OUTPATIENT_CLINIC_OR_DEPARTMENT_OTHER): Payer: MEDICAID

## 2022-12-16 ENCOUNTER — Emergency Department (HOSPITAL_BASED_OUTPATIENT_CLINIC_OR_DEPARTMENT_OTHER)
Admission: EM | Admit: 2022-12-16 | Discharge: 2022-12-16 | Disposition: A | Discharge status: Home / Self Care | Payer: MEDICAID | Attending: Emergency Medicine | Admitting: Emergency Medicine

## 2022-12-16 ENCOUNTER — Encounter (HOSPITAL_BASED_OUTPATIENT_CLINIC_OR_DEPARTMENT_OTHER): Payer: Self-pay | Admitting: Emergency Medicine

## 2022-12-16 ENCOUNTER — Other Ambulatory Visit: Payer: Self-pay

## 2022-12-16 DIAGNOSIS — K5900 Constipation, unspecified: Secondary | ICD-10-CM | POA: Insufficient documentation

## 2022-12-16 DIAGNOSIS — R109 Unspecified abdominal pain: Secondary | ICD-10-CM | POA: Diagnosis present

## 2022-12-16 LAB — CBG MONITORING, ED: Glucose-Capillary: 105 mg/dL — ABNORMAL HIGH (ref 70–99)

## 2022-12-16 MED ORDER — DOCUSATE SODIUM 100 MG PO CAPS
100.0000 mg | ORAL_CAPSULE | Freq: Once | ORAL | Status: AC
  Administered 2022-12-16: 100 mg via ORAL
  Filled 2022-12-16: qty 1

## 2022-12-16 MED ORDER — ALUM & MAG HYDROXIDE-SIMETH 200-200-20 MG/5ML PO SUSP
30.0000 mL | Freq: Once | ORAL | Status: AC
  Administered 2022-12-16: 30 mL via ORAL
  Filled 2022-12-16: qty 30

## 2022-12-16 NOTE — ED Provider Notes (Signed)
[place] [facility] Provider Note   CSN: [id number] Arrival date & time: 12/16/22  0000     History  Chief Complaint  Patient presents with   Abdominal Pain    Chris Olson. is a 32 y.o. male.  The history is provided by the patient.  Abdominal Pain Pain radiates to:  Does not radiate Pain severity:  Moderate Onset quality:  Gradual Duration:  4 weeks Timing:  Constant Progression:  Waxing and waning Context: not awakening from sleep and not diet changes   Relieved by:  Nothing Worsened by:  Nothing Ineffective treatments:  None tried Associated symptoms: constipation   Associated symptoms: no anorexia, no chest pain, no diarrhea, no dysuria, no fever, no shortness of breath and no vomiting   Risk factors: no NSAID use   Patient presents with abdominal pain and no bowel movement in several weeks.  He reports he is eating more than usual as well but not having bowel movements.      Past Medical History:  Diagnosis Date   Anxiety    Chest pain    Depression    Pericarditis    Schizophrenia (HCC)      Home Medications Prior to Admission medications   Medication Sig Start Date End Date Taking? Authorizing Provider  cariprazine (VRAYLAR) 3 MG capsule Take 3 mg by mouth daily.    [provider]  hydrOXYzine (ATARAX/VISTARIL) 25 MG tablet Take 1 tablet (25 mg total) by mouth every 6 (six) hours as needed for anxiety. Patient not taking: Reported on 11/19/2022 08/29/19   Armandina Stammer I, NP  lamoTRIgine (LAMICTAL) 150 MG tablet Take 150 mg by mouth 2 (two) times daily.    [provider]      Allergies    Patient has no known allergies.    Review of Systems   Review of Systems  Constitutional:  Negative for fever.  Respiratory:  Negative for shortness of breath.   Cardiovascular:  Negative for chest pain.  Gastrointestinal:  Positive for abdominal pain and constipation. Negative for anorexia, diarrhea  and vomiting.  Genitourinary:  Negative for dysuria.  All other systems reviewed and are negative.   Physical Exam Updated Vital Signs BP 115/75 (BP Location: Right Arm)   Pulse 81   Temp 98.3 F (36.8 C) (Oral)   Resp 20   Ht 5\' 8"  (1.727 m)   Wt 61.2 kg   SpO2 100%   BMI 20.53 kg/m  Physical Exam Vitals and nursing note reviewed.  Constitutional:      General: He is not in acute distress.    Appearance: Normal appearance. He is well-developed. He is not diaphoretic.     Comments: Smiling   HENT:     Head: Normocephalic and atraumatic.     Nose: Nose normal.  Eyes:     Conjunctiva/sclera: Conjunctivae normal.     Pupils: Pupils are equal, round, and reactive to light.  Cardiovascular:     Rate and Rhythm: Normal rate and regular rhythm.     Pulses: Normal pulses.     Heart sounds: Normal heart sounds.  Pulmonary:     Effort: Pulmonary effort is normal.     Breath sounds: Normal breath sounds. No wheezing or rales.  Abdominal:     Palpations: Abdomen is soft. There is no mass.     Tenderness: There is no abdominal tenderness. There is no guarding or rebound.     Hernia: No hernia is  present.     Comments: Hyperactive bowel sounds   Musculoskeletal:        General: Normal range of motion.     Cervical back: Normal range of motion and neck supple.  Skin:    General: Skin is warm and dry.     Capillary Refill: Capillary refill takes less than 2 seconds.  Neurological:     General: No focal deficit present.     Mental Status: He is alert and oriented to person, place, and time.     Deep Tendon Reflexes: Reflexes normal.  Psychiatric:        Mood and Affect: Mood normal.        Behavior: Behavior normal.     ED Results / Procedures / Treatments   Labs (all labs ordered are listed, but only abnormal results are displayed) Labs Reviewed - No data to display  EKG None  Radiology DG Abdomen Acute W/Chest  Result Date: 12/16/2022 CLINICAL DATA:  pain EXAM:  DG ABDOMEN ACUTE WITH 1 VIEW CHEST COMPARISON:  Chest x-ray 09/22/2015 FINDINGS: The heart and mediastinal contours are within normal limits. No focal consolidation. No pulmonary edema. No pleural effusion. No pneumothorax. There is no evidence of dilated bowel loops or free intraperitoneal air. No radiopaque calculi or other significant radiographic abnormality is seen. No acute osseous abnormality. IMPRESSION: 1. No acute cardiopulmonary disease. 2. Nonobstructive bowel gas pattern. Electronically Signed   By: Tish Frederickson M.D.   On: 12/16/2022 02:49    Procedures Procedures    Medications Ordered in ED Medications  alum & mag hydroxide-simeth (MAALOX/MYLANTA) 200-200-20 MG/5ML suspension 30 mL (has no administration in time range)    ED Course/ Medical Decision Making/ A&P                                 Medical Decision Making Ongoing abdominal pain waxing and waning x 4 weeks   Amount and/or Complexity of Data Reviewed Independent Historian:     Details: See above significant other  External Data Reviewed: labs and notes.    Details: Previous outpatient notes reviewed infectious disease labs reviewed  Labs: ordered. Radiology: ordered and independent interpretation performed.    Details: Gas and constipation by me   Risk OTC drugs. Risk Details: Exam and vitals are benign and reassuring.  Patient is not having stool commensurate to the amount of oral intake.  Recommend high fiber diet and miralax one capful daily.  Follow up with your PMD for ongoing care.  Stable for discharge, strict returns.     Final Clinical Impression(s) / ED Diagnoses Final diagnoses:  Constipation, unspecified constipation type   Return for intractable cough, coughing up blood, fevers > 100.4 unrelieved by medication, shortness of breath, intractable vomiting, chest pain, shortness of breath, weakness, numbness, changes in speech, facial asymmetry, abdominal pain, passing out, Inability to  tolerate liquids or food, cough, altered mental status or any concerns. No signs of systemic illness or infection. The patient is nontoxic-appearing on exam and vital signs are within normal limits.  I have reviewed the triage vital signs and the nursing notes. Pertinent labs & imaging results that were available during my care of the patient were reviewed by me and considered in my medical decision making (see chart for details). After history, exam, and medical workup I feel the patient has been appropriately medically screened and is safe for discharge home. Pertinent diagnoses were discussed with  the patient. Patient was given return precautions.    Rx / DC Orders ED Discharge Orders     None         Abdulraheem Pineo, MD 12/16/22 [phone]

## 2022-12-16 NOTE — Discharge Instructions (Addendum)
Miralax one capful daily.   

## 2022-12-16 NOTE — ED Triage Notes (Signed)
Pt reports intermittent abd pain x 1 month that improves with eating. He states he feels generally unwell, and is needing to eat more than usual due to this pain. Denies hx of ulcers.

## 2023-01-13 ENCOUNTER — Emergency Department (HOSPITAL_BASED_OUTPATIENT_CLINIC_OR_DEPARTMENT_OTHER): Payer: MEDICAID

## 2023-01-13 ENCOUNTER — Other Ambulatory Visit: Payer: Self-pay

## 2023-01-13 ENCOUNTER — Emergency Department (HOSPITAL_BASED_OUTPATIENT_CLINIC_OR_DEPARTMENT_OTHER)
Admission: EM | Admit: 2023-01-13 | Discharge: 2023-01-13 | Disposition: A | Discharge status: Home / Self Care | Payer: MEDICAID | Attending: Emergency Medicine | Admitting: Emergency Medicine

## 2023-01-13 ENCOUNTER — Encounter (HOSPITAL_BASED_OUTPATIENT_CLINIC_OR_DEPARTMENT_OTHER): Payer: Self-pay | Admitting: Emergency Medicine

## 2023-01-13 DIAGNOSIS — N5082 Scrotal pain: Secondary | ICD-10-CM

## 2023-01-13 DIAGNOSIS — I861 Scrotal varices: Secondary | ICD-10-CM | POA: Insufficient documentation

## 2023-01-13 DIAGNOSIS — L729 Follicular cyst of the skin and subcutaneous tissue, unspecified: Secondary | ICD-10-CM

## 2023-01-13 DIAGNOSIS — N442 Benign cyst of testis: Secondary | ICD-10-CM | POA: Diagnosis not present

## 2023-01-13 LAB — URINALYSIS, MICROSCOPIC (REFLEX): RBC / HPF: NONE SEEN RBC/hpf (ref 0–5)

## 2023-01-13 LAB — CBC WITH DIFFERENTIAL/PLATELET
Abs Immature Granulocytes: 0.03 10*3/uL (ref 0.00–0.07)
Basophils Absolute: 0.1 10*3/uL (ref 0.0–0.1)
Basophils Relative: 1 %
Eosinophils Absolute: 0.2 10*3/uL (ref 0.0–0.5)
Eosinophils Relative: 2 %
HCT: 43 % (ref 39.0–52.0)
Hemoglobin: 15.4 g/dL (ref 13.0–17.0)
Immature Granulocytes: 0 %
Lymphocytes Relative: 20 %
Lymphs Abs: 2.2 10*3/uL (ref 0.7–4.0)
MCH: 33.9 pg (ref 26.0–34.0)
MCHC: 35.8 g/dL (ref 30.0–36.0)
MCV: 94.7 fL (ref 80.0–100.0)
Monocytes Absolute: 0.5 10*3/uL (ref 0.1–1.0)
Monocytes Relative: 4 %
Neutro Abs: 8.1 10*3/uL — ABNORMAL HIGH (ref 1.7–7.7)
Neutrophils Relative %: 73 %
Platelets: 159 10*3/uL (ref 150–400)
RBC: 4.54 MIL/uL (ref 4.22–5.81)
RDW: 11.9 % (ref 11.5–15.5)
WBC: 11.1 10*3/uL — ABNORMAL HIGH (ref 4.0–10.5)
nRBC: 0 % (ref 0.0–0.2)

## 2023-01-13 LAB — URINALYSIS, ROUTINE W REFLEX MICROSCOPIC
Glucose, UA: NEGATIVE mg/dL
Hgb urine dipstick: NEGATIVE
Ketones, ur: 40 mg/dL — AB
Leukocytes,Ua: NEGATIVE
Nitrite: NEGATIVE
Protein, ur: 30 mg/dL — AB
Specific Gravity, Urine: 1.025 (ref 1.005–1.030)
pH: 5.5 (ref 5.0–8.0)

## 2023-01-13 LAB — BASIC METABOLIC PANEL
Anion gap: 7 (ref 5–15)
BUN: 13 mg/dL (ref 6–20)
CO2: 24 mmol/L (ref 22–32)
Calcium: 8.6 mg/dL — ABNORMAL LOW (ref 8.9–10.3)
Chloride: 104 mmol/L (ref 98–111)
Creatinine, Ser: 0.88 mg/dL (ref 0.61–1.24)
GFR, Estimated: >60 mL/min (ref >60)
Glucose, Bld: 96 mg/dL (ref 70–99)
Potassium: 3.9 mmol/L (ref 3.5–5.1)
Sodium: 135 mmol/L (ref 135–145)

## 2023-01-13 MED ORDER — KETOROLAC TROMETHAMINE 15 MG/ML IJ SOLN
15.0000 mg | Freq: Once | INTRAMUSCULAR | Status: AC
  Administered 2023-01-13: 15 mg via INTRAVENOUS
  Filled 2023-01-13: qty 1

## 2023-01-13 MED ORDER — LEVOFLOXACIN 500 MG PO TABS
500.0000 mg | ORAL_TABLET | Freq: Every day | ORAL | 0 refills | Status: AC
Start: 2023-01-13 — End: 2023-01-23

## 2023-01-13 MED ORDER — LACTATED RINGERS IV BOLUS
1000.0000 mL | Freq: Once | INTRAVENOUS | Status: AC
  Administered 2023-01-13: 1000 mL via INTRAVENOUS

## 2023-01-13 NOTE — ED Triage Notes (Signed)
Pt reports RT testicle pain with radiation to RT groin for some time; general lower abd pain as well; intermittent HAs

## 2023-01-13 NOTE — Discharge Instructions (Addendum)
Your [place] results revealed the following: IMPRESSION:  1. No evidence of testicular torsion.  2. Moderate left and mild right varicocele.  3. No sonographic finding of right inguinal hernia.  4. Hypoechoic focus in the inferior right testicle measures 2 mm,  likely a small cyst.    Your pain and discomfort is likely due to varicocele versus the testicular cyst which is likely a spermatocele.  We can trial a course of antibiotics, recommend scrotal support for the varicocele and NSAIDs for pain control, recommend outpatient follow-up with your primary care provider to ensure resolution.

## 2023-01-13 NOTE — ED Notes (Signed)
US at bedside

## 2023-01-13 NOTE — ED Provider Notes (Signed)
[place] [facility] Provider Note   CSN: [id number] Arrival date & time: 01/13/23  1622     History  Chief Complaint  Patient presents with   Testicle Pain    Chris Olson. is a 32 y.o. male.   Testicle Pain     32 year old male with medical history significant for schizophrenia, depression, anxiety presenting to the emergency department with right testicular pain.  The patient states that he thinks that the pain has been around for 4 to 6 weeks.  He endorses mild swelling and tenderness in the posterior aspect of his right testicle.  No acute worsening of pain.  He is not concerned for transmitted infection.  He denies any dysuria or frequency.  Home Medications Prior to Admission medications   Medication Sig Start Date End Date Taking? Authorizing Provider  levofloxacin (LEVAQUIN) 500 MG tablet Take 1 tablet (500 mg total) by mouth daily for 10 days. 01/13/23 01/23/23 Yes Ernie Avena, MD  cariprazine (VRAYLAR) 3 MG capsule Take 3 mg by mouth daily.    [provider]  hydrOXYzine (ATARAX/VISTARIL) 25 MG tablet Take 1 tablet (25 mg total) by mouth every 6 (six) hours as needed for anxiety. Patient not taking: Reported on 11/19/2022 08/29/19   Armandina Stammer I, NP  lamoTRIgine (LAMICTAL) 150 MG tablet Take 150 mg by mouth 2 (two) times daily.    [provider]      Allergies    Patient has no known allergies.    Review of Systems   Review of Systems  Genitourinary:  Positive for testicular pain.  All other systems reviewed and are negative.   Physical Exam Updated Vital Signs BP 111/77   Pulse 68   Temp 98.4 F (36.9 C)   Resp 15   Ht 5\' 8"  (1.727 m)   Wt 61.2 kg   SpO2 100%   BMI 20.53 kg/m  Physical Exam Vitals and nursing note reviewed. Exam conducted with a chaperone present.  Constitutional:      General: He is not in acute distress. HENT:     Head: Normocephalic and atraumatic.  Eyes:      Conjunctiva/sclera: Conjunctivae normal.     Pupils: Pupils are equal, round, and reactive to light.  Cardiovascular:     Rate and Rhythm: Normal rate and regular rhythm.  Pulmonary:     Effort: Pulmonary effort is normal. No respiratory distress.  Abdominal:     General: There is no distension.     Tenderness: There is no guarding.     Hernia: There is no hernia in the left inguinal area or right inguinal area.  Genitourinary:    Penis: Normal.      Testes:        Right: Tenderness present.     Epididymis:     Right: Tenderness present.     Left: Normal.     Comments: Mild TTP of the posterior right testicle, no hernia Musculoskeletal:        General: No deformity or signs of injury.     Cervical back: Neck supple.  Skin:    Findings: No lesion or rash.  Neurological:     General: No focal deficit present.     Mental Status: He is alert. Mental status is at baseline.     ED Results / Procedures / Treatments   Labs (all labs ordered are listed, but only abnormal results are displayed) Labs Reviewed  URINALYSIS, ROUTINE W REFLEX  MICROSCOPIC - Abnormal; Notable for the following components:      Result Value   Bilirubin Urine SMALL (*)    Ketones, ur 40 (*)    Protein, ur 30 (*)    All other components within normal limits  CBC WITH DIFFERENTIAL/PLATELET - Abnormal; Notable for the following components:   WBC 11.1 (*)    Neutro Abs 8.1 (*)    All other components within normal limits  BASIC METABOLIC PANEL - Abnormal; Notable for the following components:   Calcium 8.6 (*)    All other components within normal limits  URINALYSIS, MICROSCOPIC (REFLEX) - Abnormal; Notable for the following components:   Bacteria, UA FEW (*)    All other components within normal limits    EKG None  Radiology US SCROTUM W/DOPPLER  Result Date: 01/13/2023 CLINICAL DATA:  Chronic right groin/testicular pain. No known injury. EXAM: SCROTAL ULTRASOUND DOPPLER ULTRASOUND OF THE  TESTICLES TECHNIQUE: Complete ultrasound examination of the testicles, epididymis, and other scrotal structures was performed. Color and spectral Doppler ultrasound were also utilized to evaluate blood flow to the testicles. COMPARISON:  None Available. FINDINGS: Right testicle Measurements: 4.7 x 2.4 x 1.7 cm, 9.9 mL. Hypoechoic focus in the inferior testicle measures 2 mm. Left testicle Measurements: 5.0 x 2.5 x 1.8 cm, 11.5 mL. No mass or microlithiasis visualized. Right epididymis:  Small cyst measures 4 mm. Left epididymis:  Normal in size and appearance. Hydrocele:  None visualized. Varicocele:  Moderate left and mild right varicocele. Pulsed Doppler interrogation of both testes demonstrates normal low resistance arterial and venous waveforms bilaterally. No sonographic finding of right inguinal hernia. IMPRESSION: 1. No evidence of testicular torsion. 2. Moderate left and mild right varicocele. 3. No sonographic finding of right inguinal hernia. 4. Hypoechoic focus in the inferior right testicle measures 2 mm, likely a small cyst. Electronically Signed   By: Agustin Cree M.D.   On: 01/13/2023 17:53    Procedures Procedures    Medications Ordered in ED Medications  ketorolac (TORADOL) 15 MG/ML injection 15 mg (15 mg Intravenous Given 01/13/23 1753)  lactated ringers bolus 1,000 mL (0 mLs Intravenous Stopped 01/13/23 1928)    ED Course/ Medical Decision Making/ A&P                                 Medical Decision Making Amount and/or Complexity of Data Reviewed Labs: ordered. Radiology: ordered.  Risk Prescription drug management.    32 year old male with medical history significant for schizophrenia, depression, anxiety presenting to the emergency department with right testicular pain.  The patient states that he thinks that the pain has been around for 4 to 6 weeks.  He endorses mild swelling and tenderness in the posterior aspect of his right testicle.  No acute worsening of pain.  He  is not concerned for transmitted infection.  He denies any dysuria or frequency.    On arrival, the patient was afebrile, initially tachycardic heart rate 122, hypertensive, improved to heart rate 68 after an IV fluid bolus, BP 111/77 after pain control with Toradol, saturating 100% on room air.  Patient presenting with tenderness about the right epididymis.  Not concern for STIs, patient denies dysuria but with tenderness about the epididymis on the right, considered epididymitis versus varicocele, lower concern for testicular torsion, considered scrotal abscess.  Gwyndolyn Kaufman evaluation revealed urinalysis with negative nitrites, negative leukocytes, 0-5 WBCs and only few bacteria seen, CBC with  a nonspecific leukocytosis to 11.1, hemoglobin 15.4, BMP generally unremarkable.  Patient was feeling mildly symptomatically improved following Toradol, his heart rate improved following an LR bolus.  A scrotal ultrasound was performed which revealed the following: IMPRESSION:  1. No evidence of testicular torsion.  2. Moderate left and mild right varicocele.  3. No sonographic finding of right inguinal hernia.  4. Hypoechoic focus in the inferior right testicle measures 2 mm,  likely a small cyst.    Provided with return precautions, advised scrotal support, NSAIDs for pain control, will discharge on a short course of Levaquin, advised outpatient PCP follow-up to ensure resolution.   Final Clinical Impression(s) / ED Diagnoses Final diagnoses:  Scrotal pain  Varicocele present on ultrasound of scrotum  Scrotal cyst    Rx / DC Orders ED Discharge Orders          Ordered    levofloxacin (LEVAQUIN) 500 MG tablet  Daily        01/13/23 1925              Ernie Avena, MD 01/13/23 1929

## 2023-03-23 ENCOUNTER — Telehealth: Payer: MEDICAID | Admitting: Family Medicine

## 2023-03-23 DIAGNOSIS — R632 Polyphagia: Secondary | ICD-10-CM

## 2023-03-23 DIAGNOSIS — L290 Pruritus ani: Secondary | ICD-10-CM | POA: Diagnosis not present

## 2023-03-23 NOTE — Progress Notes (Signed)
Virtual Visit Consent   Chris Olson., you are scheduled for a virtual visit with a [place] provider today. Just as with appointments in the office, your consent must be obtained to participate. Your consent will be active for this visit and any virtual visit you may have with one of our providers in the next 365 days. If you have a MyChart account, a copy of this consent can be sent to you electronically.  As this is a virtual visit, video technology does not allow for your provider to perform a traditional examination. This may limit your provider's ability to fully assess your condition. If your provider identifies any concerns that need to be evaluated in person or the need to arrange testing (such as labs, EKG, etc.), we will make arrangements to do so. Although advances in technology are sophisticated, we cannot ensure that it will always work on either your end or our end. If the connection with a video visit is poor, the visit may have to be switched to a telephone visit. With either a video or telephone visit, we are not always able to ensure that we have a secure connection.  By engaging in this virtual visit, you consent to the provision of healthcare and authorize for your insurance to be billed (if applicable) for the services provided during this visit. Depending on your insurance coverage, you may receive a charge related to this service.  I need to obtain your verbal consent now. Are you willing to proceed with your visit today? Chris Olson. has provided verbal consent on 03/23/2023 for a virtual visit (video or telephone). Georgana Curio, FNP  Date: 03/23/2023 6:59 PM  Virtual Visit via Video Note   I, Georgana Curio, connected with  Chris Olson.  ([id number], Jun 24, 1990) on 03/23/23 at  6:45 PM EST by a video-enabled telemedicine application and verified that I am speaking with the correct person using two identifiers.  Location: Patient: Virtual Visit Location  Patient: Home Provider: Virtual Visit Location Provider: Home Office   I discussed the limitations of evaluation and management by telemedicine and the availability of in person appointments. The patient expressed understanding and agreed to proceed.    History of Present Illness: Chris Olson. is a 33 y.o. who identifies as a male who was assigned male at birth, and is being seen today for anal itching, increased appetite but not gaining weight, feeling like something in in rectum. No abd pain or fever. [name]  HPI: HPI  Problems:  Patient Active Problem List   Diagnosis Date Noted   Trichomonas exposure 11/19/2022   Polysubstance abuse (HCC) 09/13/2021   Severe recurrent major depression without psychotic features (HCC) 08/23/2019   Cocaine abuse with cocaine-induced mood disorder (HCC) 11/11/2017    Allergies: No Known Allergies Medications:  Current Outpatient Medications:    cariprazine (VRAYLAR) 3 MG capsule, Take 3 mg by mouth daily., Disp: , Rfl:    hydrOXYzine (ATARAX/VISTARIL) 25 MG tablet, Take 1 tablet (25 mg total) by mouth every 6 (six) hours as needed for anxiety. (Patient not taking: Reported on 11/19/2022), Disp: 75 tablet, Rfl: 0   lamoTRIgine (LAMICTAL) 150 MG tablet, Take 150 mg by mouth 2 (two) times daily., Disp: , Rfl:   Observations/Objective: Patient is well-developed, well-nourished in no acute distress.  Resting comfortably  at home.  Head is normocephalic, atraumatic.  No labored breathing.  Speech is clear and coherent with logical content.  Patient is alert and oriented at baseline.  Assessment and Plan: 1. Pruritus ani (Primary)  2. Increased appetite  Follow up with pcp Monday for testing.   Follow Up Instructions: I discussed the assessment and treatment plan with the patient. The patient was provided an opportunity to ask questions and all were answered. The patient agreed with the plan and demonstrated an understanding of the instructions.   A copy of instructions were sent to the patient via MyChart unless otherwise noted below.     The patient was advised to call back or seek an in-person evaluation if the symptoms worsen or if the condition fails to improve as anticipated.    Georgana Curio, FNP

## 2023-03-23 NOTE — Patient Instructions (Signed)
Anal Pruritus Anal pruritus is when the area around the opening of the butt (anus) feels itchy. This itchy feeling often happens when the area is moist. Moisture may come from sweat or from a small amount of poop (stool) that is left on the area. Many other things can also cause the itching. These include: Perfumed soaps and sprays. Colored or scented toilet paper. Washing the area too much. Eating certain foods. Having watery poop (diarrhea). Certain skin conditions or other medical conditions. The itching often goes away with home care. Scratching can damage the skin and make the problem worse. Follow these instructions at home: Skin care     Keep clean by washing your body well. Clean the affected area with wet toilet paper or a wet washcloth. Do this after you poop and at bedtime. Avoid using soaps on this area. Dry the area well. Pat the area dry with toilet paper or a towel. Do not scrub the area with anything, even toilet paper. Do not scratch the itchy area. Scratching makes the itching worse. Take a warm water bath (sitz bath) as told by your doctor. A sitz bath is a warm water bath that only comes up to your hips and covers your butt. A sitz bath may done at home in a bathtub. It may also be done with a portable sitz bath that fits over the toilet. Pat the area dry with a soft cloth after each bath. Use creams or ointments as told by your doctor. Do not use bubble bath, scented toilet paper, or genital deodorants. General instructions Watch for any changes in your symptoms. Use over-the-counter and prescription medicines only as told by your doctor. Avoid using medicines that help you poop (laxatives). Talk with your doctor about increasing the fiber in your diet. This can help keep your poop normal if you often have loose poop. Limit or avoid foods that may cause your symptoms. These foods may include: Spicy foods, such as salsa, jalapeo peppers, and spicy  seasonings. Foods with caffeine in them. Beer. Milk products. Chocolate. Nuts. Citrus fruits. Tomatoes. Wear cotton underwear and loose clothing. Keep all follow-up visits as told by your doctor. This is important. Contact a doctor if: Your itching does not get better after a few days. Your itching gets worse. You have a fever. You have redness, swelling, or pain in the itchy area. You have fluid, blood, or pus coming from the itchy area. Summary Anal pruritus is when the area around the opening of the butt (anus) feels itchy. Use over-the-counter and prescription medicines only as told by your doctor. Keep clean by washing your body well as told by your doctor. Talk with your doctor about increasing the fiber in your diet. This information is not intended to replace advice given to you by your health care provider. Make sure you discuss any questions you have with your health care provider. Document Revised: 12/22/2021 Document Reviewed: 12/22/2021 Elsevier Patient Education  2024 [employer].

## 2023-04-01 ENCOUNTER — Encounter (HOSPITAL_BASED_OUTPATIENT_CLINIC_OR_DEPARTMENT_OTHER): Payer: Self-pay | Admitting: Urology

## 2023-04-01 ENCOUNTER — Other Ambulatory Visit: Payer: Self-pay

## 2023-04-01 DIAGNOSIS — H1031 Unspecified acute conjunctivitis, right eye: Secondary | ICD-10-CM | POA: Insufficient documentation

## 2023-04-01 DIAGNOSIS — R1084 Generalized abdominal pain: Secondary | ICD-10-CM | POA: Diagnosis not present

## 2023-04-01 DIAGNOSIS — H5711 Ocular pain, right eye: Secondary | ICD-10-CM | POA: Diagnosis present

## 2023-04-01 LAB — URINALYSIS, ROUTINE W REFLEX MICROSCOPIC
Bilirubin Urine: NEGATIVE
Glucose, UA: NEGATIVE mg/dL
Hgb urine dipstick: NEGATIVE
Ketones, ur: NEGATIVE mg/dL
Nitrite: NEGATIVE
Protein, ur: NEGATIVE mg/dL
Specific Gravity, Urine: 1.01 (ref 1.005–1.030)
pH: 7 (ref 5.0–8.0)

## 2023-04-01 LAB — COMPREHENSIVE METABOLIC PANEL
ALT: 29 U/L (ref 0–44)
AST: 34 U/L (ref 15–41)
Albumin: 4.5 g/dL (ref 3.5–5.0)
Alkaline Phosphatase: 51 U/L (ref 38–126)
Anion gap: 11 (ref 5–15)
BUN: 8 mg/dL (ref 6–20)
CO2: 21 mmol/L — ABNORMAL LOW (ref 22–32)
Calcium: 9.4 mg/dL (ref 8.9–10.3)
Chloride: 102 mmol/L (ref 98–111)
Creatinine, Ser: 0.83 mg/dL (ref 0.61–1.24)
GFR, Estimated: >60 mL/min (ref >60)
Glucose, Bld: 97 mg/dL (ref 70–99)
Potassium: 4.1 mmol/L (ref 3.5–5.1)
Sodium: 134 mmol/L — ABNORMAL LOW (ref 135–145)
Total Bilirubin: 1.1 mg/dL (ref 0.0–1.2)
Total Protein: 7.3 g/dL (ref 6.5–8.1)

## 2023-04-01 LAB — CBC WITH DIFFERENTIAL/PLATELET
Abs Immature Granulocytes: 0.03 10*3/uL (ref 0.00–0.07)
Basophils Absolute: 0.1 10*3/uL (ref 0.0–0.1)
Basophils Relative: 1 %
Eosinophils Absolute: 0.2 10*3/uL (ref 0.0–0.5)
Eosinophils Relative: 2 %
HCT: 41.5 % (ref 39.0–52.0)
Hemoglobin: 14.6 g/dL (ref 13.0–17.0)
Immature Granulocytes: 0 %
Lymphocytes Relative: 19 %
Lymphs Abs: 1.6 10*3/uL (ref 0.7–4.0)
MCH: 34 pg (ref 26.0–34.0)
MCHC: 35.2 g/dL (ref 30.0–36.0)
MCV: 96.7 fL (ref 80.0–100.0)
Monocytes Absolute: 0.5 10*3/uL (ref 0.1–1.0)
Monocytes Relative: 6 %
Neutro Abs: 6 10*3/uL (ref 1.7–7.7)
Neutrophils Relative %: 72 %
Platelets: 163 10*3/uL (ref 150–400)
RBC: 4.29 MIL/uL (ref 4.22–5.81)
RDW: 11.8 % (ref 11.5–15.5)
WBC: 8.3 10*3/uL (ref 4.0–10.5)
nRBC: 0 % (ref 0.0–0.2)

## 2023-04-01 LAB — URINALYSIS, MICROSCOPIC (REFLEX): RBC / HPF: NONE SEEN RBC/hpf (ref 0–5)

## 2023-04-01 NOTE — ED Triage Notes (Signed)
Pt states September started to have increased hunger and intermittent abd pain  Did virtual appointment and was told to come in States cannot get full, difficulty sleeping  States nausea but not vomiting  Denies fever  States had itching on anus that started a couple months ago , and now feeling things moving on anus   Denies drug use

## 2023-04-02 ENCOUNTER — Emergency Department (HOSPITAL_BASED_OUTPATIENT_CLINIC_OR_DEPARTMENT_OTHER)
Admission: EM | Admit: 2023-04-02 | Discharge: 2023-04-02 | Disposition: A | Discharge status: Home / Self Care | Payer: MEDICAID | Attending: Emergency Medicine | Admitting: Emergency Medicine

## 2023-04-02 DIAGNOSIS — H1031 Unspecified acute conjunctivitis, right eye: Secondary | ICD-10-CM

## 2023-04-02 DIAGNOSIS — R1084 Generalized abdominal pain: Secondary | ICD-10-CM

## 2023-04-02 MED ORDER — POLYMYXIN B-TRIMETHOPRIM 10000-0.1 UNIT/ML-% OP SOLN: 1.0000 [drp] | OPHTHALMIC | 0 refills | Status: DC

## 2023-04-02 MED ORDER — POLYMYXIN B-TRIMETHOPRIM 10000-0.1 UNIT/ML-% OP SOLN: 1.0000 [drp] | OPHTHALMIC | 0 refills | Status: AC

## 2023-04-02 MED ORDER — MEBENDAZOLE 100 MG PO CHEW: 100.0000 mg | CHEWABLE_TABLET | Freq: Two times a day (BID) | ORAL | 0 refills | Status: DC

## 2023-04-02 MED ORDER — METRONIDAZOLE 500 MG PO TABS: 500.0000 mg | ORAL_TABLET | Freq: Three times a day (TID) | ORAL | 0 refills | Status: DC

## 2023-04-02 NOTE — ED Provider Notes (Signed)
 [place] [facility] Provider Note   CSN: [id number] Arrival date & time: 04/01/23  1825     History  Chief Complaint  Patient presents with   Multiple Complaints     Chris Olson. is a 33 y.o. male.  Patient presents with multiple complaints.  Patient reports that he feels something crawling on his anus.  It started with an itchy butt crack in September and has progressively worsened.  Patient reports he continuously feels things moving.  He has abdominal pain and intermittent vomiting.  This has been ongoing for months as well.  He reports normal or increased hunger for several months.  Patient reports that his right eye is irritated.  It sometimes crusts and drains.       Home Medications Prior to Admission medications   Medication Sig Start Date End Date Taking? Authorizing Provider  mebendazole  (VERMOX ) 100 MG chewable tablet Chew 1 tablet (100 mg total) by mouth 2 (two) times daily. Repeat in 3 weeks 04/02/23  Yes Stephon Weathers, Lonni PARAS, MD  metroNIDAZOLE  (FLAGYL ) 500 MG tablet Take 1 tablet (500 mg total) by mouth 3 (three) times daily. 04/02/23  Yes Diem Pagnotta, Lonni PARAS, MD  cariprazine  (VRAYLAR ) 3 MG capsule Take 3 mg by mouth daily.    [provider]  hydrOXYzine  (ATARAX /VISTARIL ) 25 MG tablet Take 1 tablet (25 mg total) by mouth every 6 (six) hours as needed for anxiety. Patient not taking: Reported on 11/19/2022 08/29/19   Collene Gouge I, NP  lamoTRIgine  (LAMICTAL ) 150 MG tablet Take 150 mg by mouth 2 (two) times daily.    [provider]  trimethoprim -polymyxin b  (POLYTRIM ) ophthalmic solution Place 1 drop into the right eye every 4 (four) hours for 5 days. 04/02/23 04/07/23  Haze Lonni PARAS, MD      Allergies    Patient has no known allergies.    Review of Systems   Review of Systems  Physical Exam Updated Vital Signs BP 133/86 (BP Location: Left Arm)   Pulse (!) 105   Temp 99 F (37.2 C)    Resp 18   Ht 5' 8 (1.727 m)   Wt 61.2 kg   SpO2 99%   BMI 20.51 kg/m  Physical Exam Vitals and nursing note reviewed. Exam conducted with a chaperone present.  Constitutional:      General: He is not in acute distress.    Appearance: He is well-developed.  HENT:     Head: Normocephalic and atraumatic.     Mouth/Throat:     Mouth: Mucous membranes are moist.  Eyes:     General: Vision grossly intact. Gaze aligned appropriately.        Right eye: No discharge.     Extraocular Movements: Extraocular movements intact.     Conjunctiva/sclera:     Right eye: Right conjunctiva is injected. Chemosis present. No exudate or hemorrhage. Cardiovascular:     Rate and Rhythm: Normal rate and regular rhythm.     Pulses: Normal pulses.     Heart sounds: Normal heart sounds, S1 normal and S2 normal. No murmur heard.    No friction rub. No gallop.  Pulmonary:     Effort: Pulmonary effort is normal. No respiratory distress.     Breath sounds: Normal breath sounds.  Abdominal:     Palpations: Abdomen is soft.     Tenderness: There is no abdominal tenderness. There is no guarding or rebound.     Hernia: No hernia is present.  Genitourinary:    Comments: Perianal area normal, no inflammation, rash, fluctuance Musculoskeletal:        General: No swelling.     Cervical back: Full passive range of motion without pain, normal range of motion and neck supple. No pain with movement, spinous process tenderness or muscular tenderness. Normal range of motion.     Right lower leg: No edema.     Left lower leg: No edema.  Skin:    General: Skin is warm and dry.     Capillary Refill: Capillary refill takes less than 2 seconds.     Findings: No ecchymosis, erythema, lesion or wound.  Neurological:     Mental Status: He is alert and oriented to person, place, and time.     GCS: GCS eye subscore is 4. GCS verbal subscore is 5. GCS motor subscore is 6.     Cranial Nerves: Cranial nerves 2-12 are intact.      Sensory: Sensation is intact.     Motor: Motor function is intact. No weakness or abnormal muscle tone.     Coordination: Coordination is intact.  Psychiatric:        Mood and Affect: Mood normal.        Speech: Speech normal.        Behavior: Behavior normal.     ED Results / Procedures / Treatments   Labs (all labs ordered are listed, but only abnormal results are displayed) Labs Reviewed  COMPREHENSIVE METABOLIC PANEL - Abnormal; Notable for the following components:      Result Value   Sodium 134 (*)    CO2 21 (*)    All other components within normal limits  URINALYSIS, ROUTINE W REFLEX MICROSCOPIC - Abnormal; Notable for the following components:   Leukocytes,Ua TRACE (*)    All other components within normal limits  URINALYSIS, MICROSCOPIC (REFLEX) - Abnormal; Notable for the following components:   Bacteria, UA RARE (*)    All other components within normal limits  CBC WITH DIFFERENTIAL/PLATELET    EKG None  Radiology No results found.  Procedures Procedures    Medications Ordered in ED Medications - No data to display  ED Course/ Medical Decision Making/ A&P                                 Medical Decision Making Amount and/or Complexity of Data Reviewed Labs: ordered.   Presents with constellation of chronic symptoms.  Patient feels like he has an infestation in his anus.  Visual exam is unremarkable.  Patient very agitated about this, anxious.  Will treat empirically.  Will refer to GI for his chronic abdominal complaints.  Workup today unremarkable, exam unremarkable.  Treat for conjunctivitis.        Final Clinical Impression(s) / ED Diagnoses Final diagnoses:  Generalized abdominal pain  Acute conjunctivitis of right eye, unspecified acute conjunctivitis type    Rx / DC Orders ED Discharge Orders          Ordered    metroNIDAZOLE  (FLAGYL ) 500 MG tablet  3 times daily        04/02/23 0329    mebendazole  (VERMOX ) 100 MG chewable  tablet  2 times daily        04/02/23 0329    trimethoprim -polymyxin b  (POLYTRIM ) ophthalmic solution  Every 4 hours,   Status:  Discontinued        04/02/23 0329    trimethoprim -polymyxin  b (POLYTRIM ) ophthalmic solution  Every 4 hours        04/02/23 0330              Haze Lonni PARAS, MD 04/02/23 [id number]

## 2023-04-02 NOTE — Discharge Instructions (Addendum)
The medications I have prescribed will treat for all parasites.

## 2023-04-06 ENCOUNTER — Telehealth: Payer: MEDICAID

## 2023-04-12 ENCOUNTER — Emergency Department (HOSPITAL_BASED_OUTPATIENT_CLINIC_OR_DEPARTMENT_OTHER)
Admission: EM | Admit: 2023-04-12 | Discharge: 2023-04-12 | Disposition: A | Discharge status: Home / Self Care | Payer: MEDICAID | Attending: Emergency Medicine | Admitting: Emergency Medicine

## 2023-04-12 ENCOUNTER — Other Ambulatory Visit: Payer: Self-pay

## 2023-04-12 ENCOUNTER — Encounter (HOSPITAL_BASED_OUTPATIENT_CLINIC_OR_DEPARTMENT_OTHER): Payer: Self-pay | Admitting: Emergency Medicine

## 2023-04-12 DIAGNOSIS — R109 Unspecified abdominal pain: Secondary | ICD-10-CM | POA: Insufficient documentation

## 2023-04-12 LAB — CBC
HCT: 39.9 % (ref 39.0–52.0)
Hemoglobin: 14.1 g/dL (ref 13.0–17.0)
MCH: 34.4 pg — ABNORMAL HIGH (ref 26.0–34.0)
MCHC: 35.3 g/dL (ref 30.0–36.0)
MCV: 97.3 fL (ref 80.0–100.0)
Platelets: 144 10*3/uL — ABNORMAL LOW (ref 150–400)
RBC: 4.1 MIL/uL — ABNORMAL LOW (ref 4.22–5.81)
RDW: 11.8 % (ref 11.5–15.5)
WBC: 6.9 10*3/uL (ref 4.0–10.5)
nRBC: 0 % (ref 0.0–0.2)

## 2023-04-12 LAB — COMPREHENSIVE METABOLIC PANEL
ALT: 32 U/L (ref 0–44)
AST: 36 U/L (ref 15–41)
Albumin: 3.8 g/dL (ref 3.5–5.0)
Alkaline Phosphatase: 49 U/L (ref 38–126)
Anion gap: 7 (ref 5–15)
BUN: 10 mg/dL (ref 6–20)
CO2: 23 mmol/L (ref 22–32)
Calcium: 8.3 mg/dL — ABNORMAL LOW (ref 8.9–10.3)
Chloride: 103 mmol/L (ref 98–111)
Creatinine, Ser: 0.94 mg/dL (ref 0.61–1.24)
GFR, Estimated: >60 mL/min (ref >60)
Glucose, Bld: 130 mg/dL — ABNORMAL HIGH (ref 70–99)
Potassium: 3.8 mmol/L (ref 3.5–5.1)
Sodium: 133 mmol/L — ABNORMAL LOW (ref 135–145)
Total Bilirubin: 0.2 mg/dL (ref 0.0–1.2)
Total Protein: 5.7 g/dL — ABNORMAL LOW (ref 6.5–8.1)

## 2023-04-12 LAB — LIPASE, BLOOD: Lipase: 49 U/L (ref 11–51)

## 2023-04-12 NOTE — ED Provider Notes (Signed)
[place] [facility] AT [facility] Provider Note  CSN: [id number] Arrival date & time: 04/12/23 4132  Chief Complaint(s) Abdominal Pain  HPI Chris Olson. is a 33 y.o. male with a past medical history listed below seen earlier this month with concern for possible intestinal worms treated with mebendazole and Flagyl.  Patient reports that he feels less itching in his rectum.  He is returns for intermittent abdominal discomfort while trying to have a bowel movement stating that he feels like he is "blocked."  States that he still having bowel movements but feels like he should be having more stool.  Denies any nausea or vomiting.  No other physical complaints.  The history is provided by the patient.    Past Medical History Past Medical History:  Diagnosis Date   Anxiety    Chest pain    Depression    Pericarditis    Schizophrenia [facility])    Patient Active Problem List   Diagnosis Date Noted   Trichomonas exposure 11/19/2022   Polysubstance abuse (HCC) 09/13/2021   Severe recurrent major depression without psychotic features (HCC) 08/23/2019   Cocaine abuse with cocaine-induced mood disorder (HCC) 11/11/2017   Home Medication(s) Prior to Admission medications   Medication Sig Start Date End Date Taking? Authorizing Provider  cariprazine (VRAYLAR) 3 MG capsule Take 3 mg by mouth daily.    [provider]  hydrOXYzine (ATARAX/VISTARIL) 25 MG tablet Take 1 tablet (25 mg total) by mouth every 6 (six) hours as needed for anxiety. Patient not taking: Reported on 11/19/2022 08/29/19   Armandina Stammer I, NP  lamoTRIgine (LAMICTAL) 150 MG tablet Take 150 mg by mouth 2 (two) times daily.    [provider]  mebendazole (VERMOX) 100 MG chewable tablet Chew 1 tablet (100 mg total) by mouth 2 (two) times daily. Repeat in 3 weeks 04/02/23   Gilda Crease, MD  metroNIDAZOLE (FLAGYL) 500 MG tablet Take 1 tablet (500 mg total) by mouth 3 (three) times  daily. 04/02/23   Gilda Crease, MD                                                                                                                                    Allergies Patient has no known allergies.  Review of Systems Review of Systems As noted in HPI  Physical Exam Vital Signs  I have reviewed the triage vital signs BP 106/64   Pulse 68   Temp 97.8 F (36.6 C) (Oral)   Resp 20   Ht 5\' 8"  (1.727 m)   Wt 61.2 kg   SpO2 99%   BMI 20.51 kg/m   Physical Exam Vitals reviewed.  Constitutional:      General: He is not in acute distress.    Appearance: He is well-developed. He is not diaphoretic.  HENT:     Head: Normocephalic and atraumatic.     Right Ear: External  ear normal.     Left Ear: External ear normal.     Nose: Nose normal.     Mouth/Throat:     Mouth: Mucous membranes are moist.  Eyes:     General: No scleral icterus.    Conjunctiva/sclera: Conjunctivae normal.  Neck:     Trachea: Phonation normal.  Cardiovascular:     Rate and Rhythm: Normal rate and regular rhythm.  Pulmonary:     Effort: Pulmonary effort is normal. No respiratory distress.     Breath sounds: No stridor.  Abdominal:     General: There is no distension.     Tenderness: There is no abdominal tenderness.  Musculoskeletal:        General: Normal range of motion.     Cervical back: Normal range of motion.  Neurological:     Mental Status: He is alert and oriented to person, place, and time.  Psychiatric:        Behavior: Behavior normal.     ED Results and Treatments Labs (all labs ordered are listed, but only abnormal results are displayed) Labs Reviewed  COMPREHENSIVE METABOLIC PANEL - Abnormal; Notable for the following components:      Result Value   Sodium 133 (*)    Glucose, Bld 130 (*)    Calcium 8.3 (*)    Total Protein 5.7 (*)    All other components within normal limits  CBC - Abnormal; Notable for the following components:   RBC 4.10 (*)    MCH 34.4  (*)    Platelets 144 (*)    All other components within normal limits  LIPASE, BLOOD  URINALYSIS, ROUTINE W REFLEX MICROSCOPIC                                                                                                                         EKG  EKG Interpretation Date/Time:    Ventricular Rate:    PR Interval:    QRS Duration:    QT Interval:    QTC Calculation:   R Axis:      Text Interpretation:         Radiology No results found.  Medications Ordered in ED Medications - No data to display Procedures Procedures  (including critical care time) Medical Decision Making / ED Course   Medical Decision Making Amount and/or Complexity of Data Reviewed Labs: ordered.    Abdominal discomfort  Abdomen benign.  Labs obtained to assess for complications from medication.  CBC without leukocytosis or anemia.  CMP without significant electrolyte derangements or renal sufficiency.  No evidence of transaminitis.  UA ruled out urinary tract infection. Recommended he complete his dose and follow-up with gastroenterology as previously directed.     Final Clinical Impression(s) / ED Diagnoses Final diagnoses:  Abdominal discomfort  The patient appears reasonably screened and/or stabilized for discharge and I doubt any other medical condition or other [facility] requiring further screening, evaluation, or treatment in the ED at this time.  I have discussed the findings, Dx and Tx plan with the patient/family who expressed understanding and agree(s) with the plan. Discharge instructions discussed at length. The patient/family was given strict return precautions who verbalized understanding of the instructions. No further questions at time of discharge.  Disposition: Discharge  Condition: Good  ED Discharge Orders     None        Follow Up: [facility] [address] [place] [address] [phone]  Call  to schedule an appointment for  close follow up  Gastroenterology  Call  to schedule an appointment for close follow up     This chart was dictated using voice recognition software.  Despite best efforts to proofread,  errors can occur which can change the documentation meaning.    Nira Conn, MD 04/12/23 [phone]

## 2023-04-12 NOTE — Discharge Instructions (Addendum)

## 2023-04-12 NOTE — ED Triage Notes (Signed)
Pt states came in 2 weeks ago for abdominal pain and parasites. Was given meds. Feels like has a blockage and needs to have a bowl movement. Unsure if related to parasites. States takes last Rx tomorrow. Is worried may have parasites in right eye as well.

## 2023-04-18 LAB — URINALYSIS, ROUTINE W REFLEX MICROSCOPIC
Bilirubin Urine: NEGATIVE
Glucose, UA: NEGATIVE mg/dL
Hgb urine dipstick: NEGATIVE
Ketones, ur: NEGATIVE mg/dL
Nitrite: NEGATIVE
Protein, ur: NEGATIVE mg/dL
Specific Gravity, Urine: >=1.03 (ref 1.005–1.030)
pH: 5.5 (ref 5.0–8.0)

## 2023-04-18 NOTE — ED Notes (Addendum)
 Received call from lab about corrected lab value. Leucocytes originally charted as negative which does not prompt a microscopic add on. Result corrected to trace, which should have had a microscopic. Consulted with EDP (Palumbo MD) who reviewed patient record. No action required at this time. 04/18/2023 At 04:25

## 2023-06-21 ENCOUNTER — Ambulatory Visit
Admission: EM | Admit: 2023-06-21 | Discharge: 2023-06-21 | Disposition: A | Discharge status: Home / Self Care | Payer: MEDICAID | Attending: Family Medicine | Admitting: Family Medicine

## 2023-06-21 ENCOUNTER — Other Ambulatory Visit: Payer: Self-pay

## 2023-06-21 DIAGNOSIS — H11151 Pinguecula, right eye: Secondary | ICD-10-CM | POA: Diagnosis not present

## 2023-06-21 DIAGNOSIS — R1013 Epigastric pain: Secondary | ICD-10-CM

## 2023-06-21 MED ORDER — OMEPRAZOLE 20 MG PO CPDR: 20.0000 mg | DELAYED_RELEASE_CAPSULE | Freq: Every day | ORAL | 0 refills | Status: DC

## 2023-06-21 MED ORDER — MOXIFLOXACIN HCL 0.5 % OP SOLN: 1.0000 [drp] | Freq: Three times a day (TID) | OPHTHALMIC | 0 refills | Status: AC

## 2023-06-21 NOTE — ED Triage Notes (Signed)
 Pt states he has a bump on the sclera of his right eye since feb. He was seen in the ED given eye drops at that time which helped but now it is coming back   Dispensed Days Supply Quantity Provider Pharmacy  POLYMYXIN-B/TRIMETHOPRIM  [facility]         Also reports some abdominal pain and increased appetite "for months". He was treated for parasites in Feb with Mebendazole 

## 2023-06-21 NOTE — Discharge Instructions (Signed)
 You were seen today for several issues.  I have placed a referral to [facility] today.  They should be in contact with you to set up an appointment, or you may call [phone].  I have sent out a medication to see if helpful.  I have sent out an eye drop to help with the bump at the eye.  If this continues you may follow up with an eye specialist to see if they can do anything for this issue.   You could call [facility] at [phone] to make an appointment.

## 2023-06-21 NOTE — ED Provider Notes (Signed)
 [facility]    CSN: [id number] Arrival date & time: 06/21/23  1115      History   Chief Complaint Chief Complaint  Patient presents with   Eye Problem    HPI Chris Olson. is a 33 y.o. male.    Eye Problem  Patient is here for a bump to the white part of the right eye.  HE has had problems with this for a while.  He did have eye drops (seen in Feb) and used drops with help.  Once he finished this the bump seems bigger.  It was sore, but not currently.   He is also here for abdominal pain and increased appetite.  He can eat a ton of food, but still be [place].  He has pain at the middle of the stomach.  He does not take anything for the pain.  He has normal Bms.  No diarrhea or constipation.   He he states that he was seen by GI.  He states he was seen, but was told he was "too young" for problems and they "turned me away".         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 No pertinent surgical history.     Home Medications    Prior to Admission medications   Medication Sig Start Date End Date Taking? Authorizing Provider  cariprazine  (VRAYLAR ) 3 MG capsule Take 3 mg by mouth daily.   Yes [provider]  hydrOXYzine  (ATARAX /VISTARIL ) 25 MG tablet Take 1 tablet (25 mg total) by mouth every 6 (six) hours as needed for anxiety. 08/29/19  Yes Asuncion Layer I, NP  lamoTRIgine  (LAMICTAL ) 150 MG tablet Take 150 mg by mouth 2 (two) times daily.   Yes [provider]  mebendazole  (VERMOX ) 100 MG chewable tablet Chew 1 tablet (100 mg total) by mouth 2 (two) times  daily. Repeat in 3 weeks 04/02/23   Ballard Bongo, MD  metroNIDAZOLE  (FLAGYL ) 500 MG tablet Take 1 tablet (500 mg total) by mouth 3 (three) times daily. 04/02/23   Ballard Bongo, MD    Family History History reviewed. No pertinent family history.  Social History Social History   Tobacco Use   Smoking status: Every Day    Current packs/day: 0.75    Types: Cigarettes   Smokeless tobacco: Never  Vaping Use   Vaping status: Never Used  Substance Use Topics   Alcohol use: Yes    Alcohol/week: 15.0 standard drinks of alcohol    Types: 15 Cans of beer per week    Comment: (2) 40 oz beers per day most days   Drug use: Yes    Types: Marijuana, Cocaine     Allergies   Patient has no known allergies.   Review of Systems Review of Systems  Constitutional: Negative.   HENT: Negative.    Eyes:  Positive for pain.  Respiratory: Negative.    Cardiovascular: Negative.   Gastrointestinal:  Positive for abdominal pain.  Genitourinary: Negative.   Musculoskeletal: Negative.   Psychiatric/Behavioral: Negative.       Physical Exam  Triage Vital Signs ED Triage Vitals  Encounter Vitals Group     BP 06/21/23 1130 116/78     Systolic BP Percentile --      Diastolic BP Percentile --      Pulse Rate 06/21/23 1130 84     Resp 06/21/23 1130 16     Temp 06/21/23 1130 98.5 F (36.9 C)     Temp Source 06/21/23 1130 Oral     SpO2 06/21/23 1130 97 %     Weight --      Height --      Head Circumference --      Peak Flow --      Pain Score 06/21/23 1131 8     Pain Loc --      Pain Education --      Exclude from Growth Chart --    No data found.  Updated Vital Signs BP 116/78 (BP Location: Right Arm)   Pulse 84   Temp 98.5 F (36.9 C) (Oral)   Resp 16   SpO2 97%   Visual Acuity Right Eye Distance:   Left Eye Distance:   Bilateral Distance:    Right Eye Near:   Left Eye Near:    Bilateral Near:     Physical Exam Constitutional:      Appearance: Normal  appearance. He is normal weight.  Eyes:     General: Lids are normal.     Extraocular Movements: Extraocular movements intact.     Comments: At the right lateral sclera is a small, fluid filled bump/cyst;  mild redness/irritation to the sclera;  no drainage noted  Cardiovascular:     Rate and Rhythm: Normal rate and regular rhythm.  Pulmonary:     Effort: Pulmonary effort is normal.     Breath sounds: Normal breath sounds.  Abdominal:     General: Bowel sounds are normal.     Palpations: Abdomen is soft.     Tenderness: There is abdominal tenderness. There is no guarding or rebound.     Comments: Tenderness to the epigastric area, umbilical area  Skin:    General: Skin is warm.  Neurological:     General: No focal deficit present.     Mental Status: He is alert.  Psychiatric:        Mood and Affect: Mood normal.      UC Treatments / Results  Labs (all labs ordered are listed, but only abnormal results are displayed) Labs Reviewed - No data to display  EKG   Radiology No results found.  Procedures Procedures (including critical care time)  Medications Ordered in UC Medications - No data to display  Initial Impression / Assessment and Plan / UC Course  I have reviewed the triage vital signs and the nursing notes.  Pertinent labs & imaging results that were available during my care of the patient were reviewed by me and considered in my medical decision making (see chart for details).    Final Clinical Impressions(s) / UC Diagnoses   Final diagnoses:  Pinguecula of right eye  Epigastric pain     Discharge Instructions      You were seen today for several issues.  I have placed a referral to [facility] today.  They should be in contact with you to set up an appointment, or you may call [phone].  I have sent out a medication to see if helpful.  I have sent out an eye drop to help with  the bump at the eye.  If this continues you may  follow up with an eye specialist to see if they can do anything for this issue.   You could call [facility] at [phone] to make an appointment.     ED Prescriptions     Medication Sig Dispense Auth. Provider   omeprazole (PRILOSEC) 20 MG capsule Take 1 capsule (20 mg total) by mouth daily. 30 capsule Neviah Braud, MD   moxifloxacin (VIGAMOX) 0.5 % ophthalmic solution Place 1 drop into the right eye 3 (three) times daily for 7 days. 3 mL Lesle Ras, MD      PDMP not reviewed this encounter.   Lesle Ras, MD 06/21/23 1159

## 2023-06-25 ENCOUNTER — Encounter: Payer: Self-pay | Admitting: Gastroenterology

## 2023-08-02 ENCOUNTER — Other Ambulatory Visit: Payer: Self-pay

## 2023-08-02 ENCOUNTER — Ambulatory Visit (HOSPITAL_COMMUNITY)
Admission: EM | Admit: 2023-08-02 | Discharge: 2023-08-03 | Disposition: A | Discharge status: Other Acute Inpatient Hospital | Payer: MEDICAID | Attending: Psychiatry | Admitting: Psychiatry

## 2023-08-02 DIAGNOSIS — Z9151 Personal history of suicidal behavior: Secondary | ICD-10-CM | POA: Insufficient documentation

## 2023-08-02 DIAGNOSIS — F259 Schizoaffective disorder, unspecified: Secondary | ICD-10-CM | POA: Insufficient documentation

## 2023-08-02 DIAGNOSIS — R45851 Suicidal ideations: Secondary | ICD-10-CM | POA: Insufficient documentation

## 2023-08-02 LAB — POCT URINE DRUG SCREEN - MANUAL ENTRY (I-SCREEN)
POC Amphetamine UR: NOT DETECTED
POC Buprenorphine (BUP): NOT DETECTED
POC Cocaine UR: POSITIVE — AB
POC Marijuana UR: POSITIVE — AB
POC Methadone UR: NOT DETECTED
POC Methamphetamine UR: NOT DETECTED
POC Morphine: NOT DETECTED
POC Oxazepam (BZO): NOT DETECTED
POC Oxycodone UR: NOT DETECTED
POC Secobarbital (BAR): NOT DETECTED

## 2023-08-02 MED ORDER — HALOPERIDOL LACTATE 5 MG/ML IJ SOLN: 5.0000 mg | Freq: Three times a day (TID) | INTRAMUSCULAR | Status: DC | PRN

## 2023-08-02 MED ORDER — ALUM & MAG HYDROXIDE-SIMETH 200-200-20 MG/5ML PO SUSP: 30.0000 mL | ORAL | Status: DC | PRN

## 2023-08-02 MED ORDER — HALOPERIDOL LACTATE 5 MG/ML IJ SOLN: 10.0000 mg | Freq: Three times a day (TID) | INTRAMUSCULAR | Status: DC | PRN

## 2023-08-02 MED ORDER — ACETAMINOPHEN 325 MG PO TABS: 650.0000 mg | ORAL_TABLET | Freq: Four times a day (QID) | ORAL | Status: DC | PRN

## 2023-08-02 MED ORDER — HALOPERIDOL 5 MG PO TABS: 5.0000 mg | ORAL_TABLET | Freq: Three times a day (TID) | ORAL | Status: DC | PRN

## 2023-08-02 MED ORDER — TRAZODONE HCL 50 MG PO TABS
50.0000 mg | ORAL_TABLET | Freq: Every evening | ORAL | Status: DC | PRN
  Administered 2023-08-02: 50 mg via ORAL
  Filled 2023-08-02: qty 1

## 2023-08-02 MED ORDER — DIPHENHYDRAMINE HCL 50 MG/ML IJ SOLN: 50.0000 mg | Freq: Three times a day (TID) | INTRAMUSCULAR | Status: DC | PRN

## 2023-08-02 MED ORDER — MAGNESIUM HYDROXIDE 400 MG/5ML PO SUSP: 30.0000 mL | Freq: Every day | ORAL | Status: DC | PRN

## 2023-08-02 MED ORDER — LORAZEPAM 2 MG/ML IJ SOLN: 2.0000 mg | Freq: Three times a day (TID) | INTRAMUSCULAR | Status: DC | PRN

## 2023-08-02 MED ORDER — DIPHENHYDRAMINE HCL 50 MG PO CAPS: 50.0000 mg | ORAL_CAPSULE | Freq: Three times a day (TID) | ORAL | Status: DC | PRN

## 2023-08-02 MED ORDER — CARIPRAZINE HCL 3 MG PO CAPS
3.0000 mg | ORAL_CAPSULE | Freq: Every day | ORAL | Status: DC
  Administered 2023-08-03: 3 mg via ORAL
  Filled 2023-08-02: qty 1

## 2023-08-02 MED ORDER — HYDROXYZINE HCL 25 MG PO TABS
25.0000 mg | ORAL_TABLET | Freq: Three times a day (TID) | ORAL | Status: DC | PRN
  Administered 2023-08-02: 25 mg via ORAL
  Filled 2023-08-02: qty 1

## 2023-08-02 MED ORDER — PANTOPRAZOLE SODIUM 40 MG PO TBEC
40.0000 mg | DELAYED_RELEASE_TABLET | Freq: Every day | ORAL | Status: DC
  Administered 2023-08-03: 40 mg via ORAL
  Filled 2023-08-02: qty 1

## 2023-08-02 MED ORDER — LAMOTRIGINE 150 MG PO TABS
150.0000 mg | ORAL_TABLET | Freq: Two times a day (BID) | ORAL | Status: DC
  Administered 2023-08-02 – 2023-08-03 (×2): 150 mg via ORAL
  Filled 2023-08-02 (×2): qty 1

## 2023-08-02 NOTE — Progress Notes (Signed)
   08/02/23 1933  [facility] Triage Screening (Walk-ins at [facility] only)  How Did You Hear About Us ? Legal System  What Is the Reason for Your Visit/Call Today? Chris Olson is a 33 year old male who presents to [facility] escorted by [employer]. He reports worsening depression symptoms and SI with a plan. He reports a plan to overdose on alcohol and cocaine or burn down the boarding house he is living in. He states he wanted to come to the hospital before acting on any of his plans. He reports history of suicide attempts, last occurrence was a couple of years ago where he attempted to overdose on pills. He reports history of MDD,Schizoaffective disorder, Bipolar disorder, ADHD. He states he receives outpatient therapy and psychiatry at [facility] and is compliant with medications. He denies any recent medication changes.He reports stress from feeling like he doesn't matter to his family. He states "I hate the world". He reports relationship issues,constant arguments with his girlfriend. He reports feeling like a mistake to his dad and reports a distant relationship with his family members. He reports ongoing grief from his mother passing away 2-3 years ago. He reports the past 2 weeks he has been using alcohol and marijuana daily and using cocaine occasionally. He reports last use of alcohol, cocaine and Marijuana was yesterday (unknown amount of liquor and beer, 1/2 gram of cocaine and unknown amount of Marijuana).He denies HI,paranoia and AVH. Unable to contract for safety  How Long Has This Been Causing You Problems? 1-6 months  Have You Recently Had Any Thoughts About Hurting Yourself? Yes  How long ago did you have thoughts about hurting yourself? today  Are You Planning to Commit Suicide/Harm Yourself At This time? Yes  Have you Recently Had Thoughts About Hurting Someone Marigene Shoulder? No  Are You Planning To Harm Someone At This Time? No  Physical Abuse Denies  Verbal Abuse Denies  Sexual Abuse Denies  Exploitation of  patient/patient's resources Denies  Self-Neglect Denies  Possible abuse reported to: Other (Comment) (n/a)  Are you currently experiencing any auditory, visual or other hallucinations? No  Have You Used Any Alcohol or Drugs in the Past 24 Hours? Yes  What Did You Use and How Much? alcohol (beer and liquor), marijuana, and cocaine, but unsure of amount of both used  Do you have any current medical co-morbidities that require immediate attention? No  Clinician description of patient physical appearance/behavior: anxious, depressed mood, casually dressed, cooperative  What Do You Feel Would Help You the Most Today? Treatment for Depression or other mood problem  If access to [facility] was not available, would you have sought care in the Emergency Department? Yes  Determination of Need Urgent (48 hours)  Options For Referral Inpatient Hospitalization  Determination of Need filed? Yes

## 2023-08-02 NOTE — ED Notes (Signed)
 Pt sleeping at present, no distress noted.  Monitoring for safety.

## 2023-08-02 NOTE — ED Notes (Signed)
 Pt A&O x 4, no acute distress noted, presents with suicidal ideations, worsening depression and SI with plan to overdose on alcohol and cocaine or burn down the boarding house pt is living at.  Pt unable to contract for safety. Calm & cooperative, monitoring for safety.

## 2023-08-02 NOTE — BH Assessment (Signed)
 Comprehensive Clinical Assessment (CCA) Note  08/02/2023 Chris Olson [id number]  Chief Complaint:  Chief Complaint  Patient presents with   Suicidal   Disposition: Per Angelica Kemp Bobbitt,NP patient is recommended for inpatient admission.    The patient demonstrates the following risk factors for suicide: Chronic risk factors for suicide include: psychiatric disorder of MDD,ADHD,Schizoaffective disorder and previous suicide attempts "a couple of years ago" attempted to overdose on pills per his report. Acute risk factors for suicide include: family or marital conflict and loss (financial, interpersonal, professional). Protective factors for this patient include: hope for the future. Considering these factors, the overall suicide risk at this point appears to be high. Patient is not appropriate for outpatient follow up.  Chris Olson is a 33 year old male who presents to [facility] escorted by [employer]. He reports worsening depression symptoms and SI with a plan. He reports a plan to overdose on alcohol and cocaine or burn down the boarding house he is living in. He states he wanted to come to the hospital before acting on any of his plans. He reports history of suicide attempts, last occurrence was a couple of years ago where he attempted to overdose on pills. He reports history of MDD,Schizoaffective disorder, Bipolar disorder, ADHD. He states he receives outpatient therapy and psychiatry at [facility] and is compliant with medications. He denies any recent medication changes.He reports stress from feeling like he doesn't matter to his family. He states "I hate the world". He reports relationship issues,constant arguments with his girlfriend. He reports feeling like a mistake to his dad and reports a distant relationship with his family members. He reports ongoing grief from his mother passing away 2-3 years ago. Patient reports isolation, crying spells, irritability, hopelessness, loss of interest to do things  they enjoy, fatigue, lack of concentration, worthlessness, insomnia, and change in his appetite.He reports the past 2 weeks he has been using alcohol and marijuana daily and using cocaine occasionally. He reports last use of alcohol, cocaine and Marijuana was yesterday (unknown amount of liquor and beer, 1/2 gram of cocaine and unknown amount of Marijuana).He denies HI,paranoia and AVH. He denies access to weapons, denies history of abuse or trauma, denies current legal issues.  Patient is unable to contract for safety outside of the hospital. Treatment options were discussed patient is in agreement with the plan for inpatient admission. He gives permission to speak with his girlfriend Shelvy Dickens if needed.     Visit Diagnosis:  Suicidal Ideation   CCA Screening, Triage and Referral (STR)  Patient Reported Information How did you hear about us ? Legal System  What Is the Reason for Your Visit/Call Today? Chris Olson is a 33 year old male who presents to [facility] escorted by [employer]. He reports worsening depression symptoms and SI with a plan. He reports a plan to overdose on alcohol and cocaine or burn down the boarding house he is living in. He states he wanted to come to the hospital before acting on any of his plans. He reports history of suicide attempts, last occurrence was a couple of years ago where he attempted to overdose on pills. He reports history of MDD,Schizoaffective disorder, Bipolar disorder, ADHD. He states he receives outpatient therapy and psychiatry at [facility] and is compliant with medications. He denies any recent medication changes.He reports stress from feeling like he doesn't matter to his family. He states "I hate the world". He reports relationship issues,constant arguments with his girlfriend. He reports feeling like a mistake to his  dad and reports a distant relationship with his family members. He reports ongoing grief from his mother passing away 2-3 years ago. He reports the past  2 weeks he has been using alcohol and marijuana daily and using cocaine occasionally. He reports last use of alcohol, cocaine and Marijuana was yesterday (unknown amount of liquor and beer, 1/2 gram of cocaine and unknown amount of Marijuana).He denies HI,paranoia and AVH. Unable to contract for safety  How Long Has This Been Causing You Problems? 1-6 months  What Do You Feel Would Help You the Most Today? Treatment for Depression or other mood problem   Have You Recently Had Any Thoughts About Hurting Yourself? Yes  Are You Planning to Commit Suicide/Harm Yourself At This time? Yes   Flowsheet Row ED from 08/02/2023 in [facility] UC from 06/21/2023 in [facility] at [facility]) ED from 04/12/2023 in [facility] Department at [facility]  C-SSRS RISK CATEGORY High Risk No Risk No Risk       Have you Recently Had Thoughts About Hurting Someone Marigene Shoulder? No  Are You Planning to Harm Someone at This Time? No  Explanation: denies HI   Have You Used Any Alcohol or Drugs in the Past 24 Hours? Yes  How Long Ago Did You Use Drugs or Alcohol? yesterday What Did You Use and How Much? alcohol (beer and liquor), marijuana, and cocaine, but unsure of amount of both used   Do You Currently Have a Therapist/Psychiatrist? Yes  Name of Therapist/Psychiatrist: Name of Therapist/Psychiatrist: Vallarie Gauze for psychiatry, [facility] 2x a month for therapy.   Have You Been Recently Discharged From Any Office Practice or Programs? No  Explanation of Discharge From Practice/Program: n/a    CCA Screening Triage Referral Assessment Type of Contact: Face-to-Face  Telemedicine Service Delivery:   Is this Initial or Reassessment?   Date Telepsych consult ordered in [facility]:    Time Telepsych consult ordered in [facility]:    Location of Assessment: [facility] Assessment Services  Provider Location: [facility] Assessment  Services   Collateral Involvement: None   Does Patient Have a Automotive engineer Guardian? No  Legal Guardian Contact Information: n/a  Copy of Legal Guardianship Form: -- (n/a)  Legal Guardian Notified of Arrival: -- (n/a)  Legal Guardian Notified of Pending Discharge: -- (n/a)  If Minor and Not Living with Parent(s), Who has Custody? n/a  Is CPS involved or ever been involved? Never  Is APS involved or ever been involved? Never   Patient Determined To Be At Risk for Harm To Self or Others Based on Review of Patient Reported Information or Presenting Complaint? Yes, for Self-Harm  Method: Plan without intent  Availability of Means: No access or NA  Intent: Clearly intends on inflicting harm that could cause death  Notification Required: No need or identified person  Additional Information for Danger to Others Potential: -- (n/a)  Additional Comments for Danger to Others Potential: n/a  Are There Guns or Other Weapons in Your Home? No  Types of Guns/Weapons: denies access to weapons  Are These Weapons Safely Secured?                            -- (denies access to weapon)  Who Could Verify You Are Able To Have These Secured: denies access to weapons  Do You Have any Outstanding Charges, Pending Court Dates, Parole/Probation? denies  Contacted  To Inform of Risk of Harm To Self or Others: Law Enforcement    Does Patient Present under Involuntary Commitment? No    [place] of Residence: [place]   Patient Currently Receiving the Following Services: Individual Therapy; Medication Management   Determination of Need: Urgent (48 hours)   Options For Referral: Inpatient Hospitalization     CCA Biopsychosocial Patient Reported Schizophrenia/Schizoaffective Diagnosis in Past: Yes   Strengths: Cooperation in assessment. Seeking treatment   Mental Health Symptoms Depression:  Irritability; Increase/decrease in appetite; Hopelessness; Sleep (too much  or little); Change in energy/activity; Difficulty Concentrating; Fatigue; Worthlessness; Tearfulness   Duration of Depressive symptoms: Duration of Depressive Symptoms: Greater than two weeks   Mania:  Racing thoughts   Anxiety:   Worrying; Tension   Psychosis:  None   Duration of Psychotic symptoms:    Trauma:  None   Obsessions:  None   Compulsions:  None   Inattention:  Disorganized; Forgetful; Loses things   Hyperactivity/Impulsivity:  Feeling of restlessness; Fidgets with hands/feet   Oppositional/Defiant Behaviors:  Angry   Emotional Irregularity:  Potentially harmful impulsivity; Recurrent suicidal behaviors/gestures/threats   Other Mood/Personality Symptoms:  Pt is very depressed.    Mental Status Exam Appearance and self-care  Stature:  Average   Weight:  Average weight   Clothing:  Casual   Grooming:  Normal   Cosmetic use:  None   Posture/gait:  Normal   Motor activity:  Not Remarkable   Sensorium  Attention:  Normal   Concentration:  Normal   Orientation:  X5   Recall/memory:  Normal   Affect and Mood  Affect:  Depressed   Mood:  Depressed   Relating  Eye contact:  Fleeting   Facial expression:  Depressed   Attitude toward examiner:  Cooperative   Thought and Language  Speech flow: Normal   Thought content:  Appropriate to Mood and Circumstances   Preoccupation:  Suicide   Hallucinations:  None   Organization:  Engineer, site of Knowledge:  Fair   Intelligence:  Average   Abstraction:  Functional   Judgement:  Fair   Dance movement psychotherapist:  Realistic   Insight:  Fair   Decision Making:  Impulsive   Social Functioning  Social Maturity:  Isolates; Impulsive   Social Judgement:  Chemical engineer"   Stress  Stressors:  Other (Comment); Family conflict; Relationship (Pt reports, feeling nobody cares, want to be left alone.)   Coping Ability:  Overwhelmed   Skill Deficits:  Communication;  Self-care   Supports:  Family; Friends/Service system     Religion: Religion/Spirituality Are You A Religious Person?: No How Might This Affect Treatment?: n/a  Leisure/Recreation: Leisure / Recreation Do You Have Hobbies?: Yes Leisure and Hobbies: reading, videogames  Exercise/Diet: Exercise/Diet Do You Exercise?: No Have You Gained or Lost A Significant Amount of Weight in the Past Six Months?: No Do You Follow a Special Diet?: No Do You Have Any Trouble Sleeping?: Yes Explanation of Sleeping Difficulties: Insomnia   CCA Employment/Education Employment/Work Situation: Employment / Work Situation Employment Situation: On disability Why is Patient on Disability: Schizoaffective Disorder. How Long has Patient Been on Disability: few years Patient's Job has Been Impacted by Current Illness: No Has Patient ever Been in the Military?: No  Education: Education Is Patient Currently Attending School?: No Last Grade Completed: 12 Did You Attend College?: No Did You Have An Individualized Education Program (IIEP): Yes Did You Have Any Difficulty At School?: Yes Were Any  Medications Ever Prescribed For These Difficulties?:  Ambrose Bailer) Patient's Education Has Been Impacted by Current Illness: No   CCA Family/Childhood History Family and Relationship History: Family history Marital status: Other (comment) (in relationship, unknown length of time) Does patient have children?: Yes How many children?: 1 How is patient's relationship with their children?: Pt reports, the mother of his daughter does not want him in her life. Pt reports, he has not seen his daughter in years.  Childhood History:  Childhood History By whom was/is the patient raised?:  (UTA) Did patient suffer any verbal/emotional/physical/sexual abuse as a child?: No Did patient suffer from severe childhood neglect?: No Has patient ever been sexually abused/assaulted/raped as an adolescent or adult?: No Was the  patient ever a victim of a crime or a disaster?: No Witnessed domestic violence?: No Has patient been affected by domestic violence as an adult?: No       CCA Substance Use Alcohol/Drug Use: Alcohol / Drug Use Pain Medications: See MAR Prescriptions: See MAR Over the Counter: See MAR History of alcohol / drug use?: Yes (Alcohol, THC, cocaine) Longest period of sobriety (when/how long): unknown Negative Consequences of Use: Personal relationships, Financial                         ASAM's:  Six Dimensions of Multidimensional Assessment  Dimension 1:  Acute Intoxication and/or Withdrawal Potential:      Dimension 2:  Biomedical Conditions and Complications:      Dimension 3:  Emotional, Behavioral, or Cognitive Conditions and Complications:     Dimension 4:  Readiness to Change:     Dimension 5:  Relapse, Continued use, or Continued Problem Potential:     Dimension 6:  Recovery/Living Environment:     ASAM Severity Score:    ASAM Recommended Level of Treatment:     Substance use Disorder (SUD)    Recommendations for Services/Supports/Treatments: Recommendations for Services/Supports/Treatments Recommendations For Services/Supports/Treatments: Other (Comment) (Pt to be admitted to [facility] for Continuous Assessment.)  Disposition Recommendation per psychiatric provider: We recommend inpatient psychiatric hospitalization when medically cleared. Patient is under voluntary admission status at this time; please IVC if attempts to leave hospital.   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

## 2023-08-03 ENCOUNTER — Inpatient Hospital Stay (HOSPITAL_COMMUNITY)
Admission: AD | Admit: 2023-08-03 | Discharge: 2023-08-06 | DRG: 885 | Disposition: A | Discharge status: Home / Self Care | Payer: MEDICAID | Source: Intra-hospital | Attending: Psychiatry | Admitting: Psychiatry

## 2023-08-03 ENCOUNTER — Encounter (HOSPITAL_COMMUNITY): Payer: Self-pay | Admitting: Psychiatry

## 2023-08-03 DIAGNOSIS — F250 Schizoaffective disorder, bipolar type: Secondary | ICD-10-CM | POA: Diagnosis present

## 2023-08-03 DIAGNOSIS — F419 Anxiety disorder, unspecified: Secondary | ICD-10-CM | POA: Diagnosis present

## 2023-08-03 DIAGNOSIS — F17210 Nicotine dependence, cigarettes, uncomplicated: Secondary | ICD-10-CM | POA: Diagnosis present

## 2023-08-03 DIAGNOSIS — F1020 Alcohol dependence, uncomplicated: Secondary | ICD-10-CM | POA: Diagnosis present

## 2023-08-03 DIAGNOSIS — F4323 Adjustment disorder with mixed anxiety and depressed mood: Secondary | ICD-10-CM | POA: Diagnosis present

## 2023-08-03 DIAGNOSIS — F1220 Cannabis dependence, uncomplicated: Secondary | ICD-10-CM | POA: Diagnosis present

## 2023-08-03 DIAGNOSIS — Z635 Disruption of family by separation and divorce: Secondary | ICD-10-CM

## 2023-08-03 DIAGNOSIS — F1420 Cocaine dependence, uncomplicated: Secondary | ICD-10-CM | POA: Diagnosis present

## 2023-08-03 DIAGNOSIS — Z79899 Other long term (current) drug therapy: Secondary | ICD-10-CM | POA: Diagnosis not present

## 2023-08-03 DIAGNOSIS — K219 Gastro-esophageal reflux disease without esophagitis: Secondary | ICD-10-CM | POA: Diagnosis present

## 2023-08-03 DIAGNOSIS — Z5941 Food insecurity: Secondary | ICD-10-CM

## 2023-08-03 DIAGNOSIS — F332 Major depressive disorder, recurrent severe without psychotic features: Secondary | ICD-10-CM | POA: Diagnosis present

## 2023-08-03 DIAGNOSIS — Z5986 Financial insecurity: Secondary | ICD-10-CM

## 2023-08-03 DIAGNOSIS — R9431 Abnormal electrocardiogram [ECG] [EKG]: Secondary | ICD-10-CM | POA: Diagnosis present

## 2023-08-03 DIAGNOSIS — F4310 Post-traumatic stress disorder, unspecified: Secondary | ICD-10-CM | POA: Diagnosis present

## 2023-08-03 DIAGNOSIS — Z5948 Other specified lack of adequate food: Secondary | ICD-10-CM

## 2023-08-03 DIAGNOSIS — Z604 Social exclusion and rejection: Secondary | ICD-10-CM | POA: Diagnosis present

## 2023-08-03 DIAGNOSIS — R45851 Suicidal ideations: Secondary | ICD-10-CM | POA: Diagnosis present

## 2023-08-03 DIAGNOSIS — Z630 Problems in relationship with spouse or partner: Secondary | ICD-10-CM

## 2023-08-03 DIAGNOSIS — Z638 Other specified problems related to primary support group: Secondary | ICD-10-CM

## 2023-08-03 LAB — COMPREHENSIVE METABOLIC PANEL WITH GFR
ALT: 25 U/L (ref 0–44)
AST: 34 U/L (ref 15–41)
Albumin: 4.4 g/dL (ref 3.5–5.0)
Alkaline Phosphatase: 51 U/L (ref 38–126)
Anion gap: 12 (ref 5–15)
BUN: 13 mg/dL (ref 6–20)
CO2: 23 mmol/L (ref 22–32)
Calcium: 9.3 mg/dL (ref 8.9–10.3)
Chloride: 103 mmol/L (ref 98–111)
Creatinine, Ser: 0.99 mg/dL (ref 0.61–1.24)
GFR, Estimated: >60 mL/min
Glucose, Bld: 114 mg/dL — ABNORMAL HIGH (ref 70–99)
Potassium: 4.4 mmol/L (ref 3.5–5.1)
Sodium: 138 mmol/L (ref 135–145)
Total Bilirubin: 1.1 mg/dL (ref 0.0–1.2)
Total Protein: 6.3 g/dL — ABNORMAL LOW (ref 6.5–8.1)

## 2023-08-03 LAB — CBC WITH DIFFERENTIAL/PLATELET
Abs Immature Granulocytes: 0.03 10*3/uL (ref 0.00–0.07)
Basophils Absolute: 0.1 10*3/uL (ref 0.0–0.1)
Basophils Relative: 1 %
Eosinophils Absolute: 0.1 10*3/uL (ref 0.0–0.5)
Eosinophils Relative: 1 %
HCT: 47.5 % (ref 39.0–52.0)
Hemoglobin: 16.2 g/dL (ref 13.0–17.0)
Immature Granulocytes: 0 %
Lymphocytes Relative: 13 %
Lymphs Abs: 1.4 10*3/uL (ref 0.7–4.0)
MCH: 33.8 pg (ref 26.0–34.0)
MCHC: 34.1 g/dL (ref 30.0–36.0)
MCV: 99.2 fL (ref 80.0–100.0)
Monocytes Absolute: 0.6 10*3/uL (ref 0.1–1.0)
Monocytes Relative: 5 %
Neutro Abs: 8.4 10*3/uL — ABNORMAL HIGH (ref 1.7–7.7)
Neutrophils Relative %: 80 %
Platelets: 179 10*3/uL (ref 150–400)
RBC: 4.79 MIL/uL (ref 4.22–5.81)
RDW: 11.9 % (ref 11.5–15.5)
WBC: 10.5 10*3/uL (ref 4.0–10.5)
nRBC: 0 % (ref 0.0–0.2)

## 2023-08-03 LAB — ETHANOL: Alcohol, Ethyl (B): <15 mg/dL

## 2023-08-03 MED ORDER — ACETAMINOPHEN 325 MG PO TABS: 650.0000 mg | ORAL_TABLET | Freq: Four times a day (QID) | ORAL | Status: DC | PRN

## 2023-08-03 MED ORDER — CARIPRAZINE HCL 1.5 MG PO CAPS: 3.0000 mg | ORAL_CAPSULE | Freq: Every day | ORAL | Status: DC

## 2023-08-03 MED ORDER — NICOTINE POLACRILEX 2 MG MT GUM
2.0000 mg | CHEWING_GUM | Freq: Once | OROMUCOSAL | Status: AC
  Administered 2023-08-03: 2 mg via ORAL
  Filled 2023-08-03: qty 1

## 2023-08-03 MED ORDER — DIPHENHYDRAMINE HCL 50 MG/ML IJ SOLN: 50.0000 mg | Freq: Three times a day (TID) | INTRAMUSCULAR | Status: DC | PRN

## 2023-08-03 MED ORDER — LAMOTRIGINE 25 MG PO TABS
150.0000 mg | ORAL_TABLET | Freq: Two times a day (BID) | ORAL | Status: DC
  Administered 2023-08-03 – 2023-08-06 (×5): 150 mg via ORAL
  Filled 2023-08-03 (×2): qty 2
  Filled 2023-08-03: qty 1
  Filled 2023-08-03 (×3): qty 2

## 2023-08-03 MED ORDER — LORAZEPAM 2 MG/ML IJ SOLN: 2.0000 mg | Freq: Three times a day (TID) | INTRAMUSCULAR | Status: DC | PRN

## 2023-08-03 MED ORDER — HALOPERIDOL LACTATE 5 MG/ML IJ SOLN: 5.0000 mg | Freq: Three times a day (TID) | INTRAMUSCULAR | Status: DC | PRN

## 2023-08-03 MED ORDER — NICOTINE POLACRILEX 2 MG MT GUM
2.0000 mg | CHEWING_GUM | Freq: Once | OROMUCOSAL | Status: AC
  Administered 2023-08-03: 2 mg via ORAL

## 2023-08-03 MED ORDER — NICOTINE POLACRILEX 2 MG MT GUM
CHEWING_GUM | OROMUCOSAL | Status: AC
  Filled 2023-08-03: qty 1

## 2023-08-03 MED ORDER — LAMOTRIGINE 25 MG PO TABS
150.0000 mg | ORAL_TABLET | Freq: Two times a day (BID) | ORAL | Status: DC
  Filled 2023-08-03: qty 2

## 2023-08-03 MED ORDER — CARIPRAZINE HCL 1.5 MG PO CAPS
3.0000 mg | ORAL_CAPSULE | Freq: Every day | ORAL | Status: DC
  Administered 2023-08-04 – 2023-08-06 (×3): 3 mg via ORAL
  Filled 2023-08-03 (×4): qty 2

## 2023-08-03 MED ORDER — DIPHENHYDRAMINE HCL 25 MG PO CAPS: 50.0000 mg | ORAL_CAPSULE | Freq: Three times a day (TID) | ORAL | Status: DC | PRN

## 2023-08-03 MED ORDER — HYDROXYZINE HCL 25 MG PO TABS
25.0000 mg | ORAL_TABLET | Freq: Three times a day (TID) | ORAL | Status: DC | PRN
  Administered 2023-08-03 – 2023-08-04 (×2): 25 mg via ORAL
  Filled 2023-08-03 (×3): qty 1

## 2023-08-03 MED ORDER — TRAZODONE HCL 50 MG PO TABS
50.0000 mg | ORAL_TABLET | Freq: Every evening | ORAL | Status: DC | PRN
  Administered 2023-08-05: 50 mg via ORAL
  Filled 2023-08-03 (×2): qty 1

## 2023-08-03 MED ORDER — HALOPERIDOL LACTATE 5 MG/ML IJ SOLN: 10.0000 mg | Freq: Three times a day (TID) | INTRAMUSCULAR | Status: DC | PRN

## 2023-08-03 MED ORDER — HALOPERIDOL 5 MG PO TABS: 5.0000 mg | ORAL_TABLET | Freq: Three times a day (TID) | ORAL | Status: DC | PRN

## 2023-08-03 MED ORDER — MAGNESIUM HYDROXIDE 400 MG/5ML PO SUSP
30.0000 mL | Freq: Every day | ORAL | Status: DC | PRN
  Administered 2023-08-03 – 2023-08-04 (×2): 30 mL via ORAL
  Filled 2023-08-03 (×2): qty 30

## 2023-08-03 MED ORDER — ALUM & MAG HYDROXIDE-SIMETH 200-200-20 MG/5ML PO SUSP: 30.0000 mL | ORAL | Status: DC | PRN

## 2023-08-03 MED ORDER — PANTOPRAZOLE SODIUM 40 MG PO TBEC
40.0000 mg | DELAYED_RELEASE_TABLET | Freq: Every day | ORAL | Status: DC
  Administered 2023-08-04 – 2023-08-06 (×3): 40 mg via ORAL
  Filled 2023-08-03 (×3): qty 1

## 2023-08-03 NOTE — Progress Notes (Signed)
 Patient is a 33 year old male who presented voluntarily from the [facility] for complaints of increased depression and SI with a plan to overdose on cocaine.. Pt's PMH: MDD alcohol use disorder, cocaine use disorder and schizoaffective disorder. Pt reported that he had been feeling hopeless, angry, unwanted and unloved. Pt reported that he has been drinking 1-2 forty's a day, 1 gram cocaine 1-2x month, and smokes weed daily when able to afford it. Pt presented with a sad affect, calm, cooperative behavior, answered questions logically and coherently during admission interview. VS monitored and recorded. Skin check performed with another RN. Belongings searched and secured in locker. Admission paperwork completed and signed. Verbal understanding expressed. Patient was oriented to unit and schedule. Pt currently denies SI/HI and A/VH. Q 15 min checks initiated for safety.

## 2023-08-03 NOTE — Plan of Care (Signed)
   Problem: Education: Goal: Knowledge of [place] [facility] Education information/materials will improve Outcome: Progressing Goal: Verbalization of understanding the information provided will improve Outcome: Progressing

## 2023-08-03 NOTE — ED Notes (Signed)
 Pt observed/assessed in recliner sleeping. RR even and unlabored, appearing in no noted distress. Environmental check complete, will continue to monitor for safety

## 2023-08-03 NOTE — Progress Notes (Addendum)
 Pt would like to have something other than Trazodone  for sleep.  Pt states causes severe nightmares.     08/03/23 2154  Psych Admission Type (Psych Patients Only)  Admission Status Voluntary  Psychosocial Assessment  Patient Complaints Anxiety  Eye Contact Fair  Facial Expression Animated  Affect Anxious  Speech Logical/coherent  Interaction Assertive  Motor Activity Fidgety  Appearance/Hygiene Disheveled;In scrubs  Mood Anxious;Pleasant  Thought Process  Coherency WDL  Content WDL  Delusions None reported or observed  Perception WDL  Hallucination None reported or observed  Judgment Poor  Confusion None  Danger to Self  Current suicidal ideation? Denies  Self-Injurious Behavior No self-injurious ideation or behavior indicators observed or expressed   Agreement Not to Harm Self Yes  Description of Agreement verbal  Danger to Others  Danger to Others None reported or observed

## 2023-08-03 NOTE — Tx Team (Signed)
 Initial Treatment Plan 08/03/2023 7:52 PM [name]. UJW:[id number]    PATIENT STRESSORS: Marital or family conflict   Substance abuse     PATIENT STRENGTHS: Average or above average intelligence  Communication skills  Motivation for treatment/growth  Physical Health    PATIENT IDENTIFIED PROBLEMS: Suicide ideation    Hopelessness    Feeling unwanted and unloved    "I want to detox and stay clean"         DISCHARGE CRITERIA:  Improved stabilization in mood, thinking, and/or behavior Reduction of life-threatening or endangering symptoms to within safe limits Verbal commitment to aftercare and medication compliance Withdrawal symptoms are absent or subacute and managed without 24-hour nursing intervention  PRELIMINARY DISCHARGE PLAN: Attend 12-step recovery group Outpatient therapy Return to previous living arrangement  PATIENT/FAMILY INVOLVEMENT: This treatment plan has been presented to and reviewed with the patient, Chris Tyer Jr.,   The patient has been given the opportunity to ask questions and make suggestions.  Victorine Grater, RN 08/03/2023, 7:52 PM

## 2023-08-03 NOTE — Discharge Instructions (Signed)
Transferring to [facility].

## 2023-08-03 NOTE — ED Provider Notes (Signed)
 FBC/OBS ASAP Discharge Summary  Date and Time: 08/03/2023 10:25 AM  Name: Chris Olson.  MRN:  [id number]   Discharge Diagnoses:  Final diagnoses:  Schizoaffective disorder, unspecified type (HCC)    Subjective: Chart review: "Chris Olson is a 33 year old male who presents to [facility] escorted by [employer]. He reports worsening depression symptoms and SI with a plan. He reports a plan to overdose on alcohol and cocaine or burn down the boarding house he is living in. He states he wanted to come to the hospital before acting on any of his plans. He reports history of suicide attempts, last occurrence was a couple of years ago where he attempted to overdose on pills. He reports history of MDD,Schizoaffective disorder, Bipolar disorder, ADHD. He states he receives outpatient therapy and psychiatry at [facility] and is compliant with medications. He denies any recent medication changes.He reports stress from feeling like he doesn't matter to his family. He states "I hate the world". He reports relationship issues,constant arguments with his girlfriend. He reports feeling like a mistake to his dad and reports a distant relationship with his family members. He reports ongoing grief from his mother passing away 2-3 years ago. He reports the past 2 weeks he has been using alcohol and marijuana daily and using cocaine occasionally. He reports last use of alcohol, cocaine and Marijuana was yesterday (unknown amount of liquor and beer, 1/2 gram of cocaine and unknown amount of Marijuana) "  Stay Summary: Patient was evaluated by attending provider and  was admitted to the observation unit for overnight monitoring. Inpatient was recommended for stabilization. Per nursing and chart review, no behavioral issues noted. Patient rested well last night and remained cooperative.   Upon face-to-face encounter today 08/03/2023: patient is sitting in his bed. He appears anxious and restless. He has poor eye contact. Speech is  clear. Thought process is coherent and goal-directed. Patient admits to hx of hallucinations but currently denies. Reports feeling sad and depressed and continues to endorse passive SI. Reports feeling helpless, unsupported by his current girlfriend "she doesn't seem to care". Patient states "a lot going on". Reports he has been arguing with his girlfriend a lot. Reports not having a family around "my dan is a little supportive but he is always working". Reports he has an 3 year-old daughter but not in contact with her "she probably doesn't miss me".  Patient reports a hx of suicide attempt and was hospitalized in [place] . Reports hx of rehab at [facility]. The current used substances include alcohol, cocaine and THC. He also smokes cigarettes. Patient rates his depression at 9 and not having support is a big problem: reports he lives with his girlfriend "but sometimes she is very mean and does not seem to care". Pt's mother died 3 years ago.  Reports a family hx of mental illness: both mom and aunt had Bipolar.  Patient reports he is established with [name] and sees [name] for medication management. He denies medical/health concerns. Reports his appetite has increased lately. Reports sleep has improved since admission. Denies respiratory distress. Denies chest/back/abdominal pain. Denies headache/dizziness. Denies muscle/joint pain.   Patient continues to endorse passive SI. He presented with increased symptoms of depression including worthlessness, helplessness, anxiety, and continues to feel unsupported. I discussed inpatient with him , for stabilization and he is willing to be admitted voluntarily. Accepted at [facility] by Dr Zouev, MD.   Total Time spent with patient: 45 minutes  Past Psychiatric History: MDD, Schizoaffective disorder, Bipolar  I, ADHD.  Past Medical History: NA Family History: NA Family Psychiatric History: Both mom and aunt had Bipolar Social History: Lives with girlfriend. Reports  she is not very supportive/understanding Tobacco Cessation:  A prescription for an FDA-approved tobacco cessation medication provided at discharge  Current Medications:  Current Facility-Administered Medications  Medication Dose Route Frequency Provider Last Rate Last Admin   acetaminophen  (TYLENOL ) tablet 650 mg  650 mg Oral Q6H PRN Bobbitt, Shalon E, NP       alum & mag hydroxide-simeth (MAALOX/MYLANTA) 200-200-20 MG/5ML suspension 30 mL  30 mL Oral Q4H PRN Bobbitt, Shalon E, NP       cariprazine  (VRAYLAR ) capsule 3 mg  3 mg Oral Daily Bobbitt, Shalon E, NP   3 mg at 08/03/23 0910   haloperidol  (HALDOL ) tablet 5 mg  5 mg Oral TID PRN Bobbitt, Shalon E, NP       And   diphenhydrAMINE  (BENADRYL ) capsule 50 mg  50 mg Oral TID PRN Bobbitt, Shalon E, NP       haloperidol  lactate (HALDOL ) injection 5 mg  5 mg Intramuscular TID PRN Bobbitt, Shalon E, NP       And   diphenhydrAMINE  (BENADRYL ) injection 50 mg  50 mg Intramuscular TID PRN Bobbitt, Shalon E, NP       And   LORazepam  (ATIVAN ) injection 2 mg  2 mg Intramuscular TID PRN Bobbitt, Shalon E, NP       haloperidol  lactate (HALDOL ) injection 10 mg  10 mg Intramuscular TID PRN Bobbitt, Shalon E, NP       And   diphenhydrAMINE  (BENADRYL ) injection 50 mg  50 mg Intramuscular TID PRN Bobbitt, Shalon E, NP       And   LORazepam  (ATIVAN ) injection 2 mg  2 mg Intramuscular TID PRN Bobbitt, Shalon E, NP       hydrOXYzine  (ATARAX ) tablet 25 mg  25 mg Oral TID PRN Bobbitt, Shalon E, NP   25 mg at 08/02/23 2205   lamoTRIgine  (LAMICTAL ) tablet 150 mg  150 mg Oral BID Bobbitt, Shalon E, NP   150 mg at 08/03/23 0910   magnesium  hydroxide (MILK OF MAGNESIA) suspension 30 mL  30 mL Oral Daily PRN Bobbitt, Shalon E, NP       pantoprazole  (PROTONIX ) EC tablet 40 mg  40 mg Oral Daily Bobbitt, Shalon E, NP   40 mg at 08/03/23 0910   traZODone  (DESYREL ) tablet 50 mg  50 mg Oral QHS PRN Bobbitt, Shalon E, NP   50 mg at 08/02/23 2205   Current Outpatient  Medications  Medication Sig Dispense Refill   cariprazine  (VRAYLAR ) 3 MG capsule Take 3 mg by mouth daily.     cariprazine  (VRAYLAR ) 3 MG capsule Take 3 mg by mouth daily.     hydrOXYzine  (ATARAX /VISTARIL ) 25 MG tablet Take 1 tablet (25 mg total) by mouth every 6 (six) hours as needed for anxiety. 75 tablet 0   lamoTRIgine  (LAMICTAL ) 150 MG tablet Take 150 mg by mouth 2 (two) times daily.     mebendazole  (VERMOX ) 100 MG chewable tablet Chew 1 tablet (100 mg total) by mouth 2 (two) times daily. Repeat in 3 weeks 12 tablet 0   omeprazole  (PRILOSEC) 20 MG capsule Take 1 capsule (20 mg total) by mouth daily. 30 capsule 0    PTA Medications:  Facility Ordered Medications  Medication   acetaminophen  (TYLENOL ) tablet 650 mg   alum & mag hydroxide-simeth (MAALOX/MYLANTA) 200-200-20 MG/5ML suspension 30 mL   magnesium   hydroxide (MILK OF MAGNESIA) suspension 30 mL   haloperidol  (HALDOL ) tablet 5 mg   And   diphenhydrAMINE  (BENADRYL ) capsule 50 mg   haloperidol  lactate (HALDOL ) injection 5 mg   And   diphenhydrAMINE  (BENADRYL ) injection 50 mg   And   LORazepam  (ATIVAN ) injection 2 mg   haloperidol  lactate (HALDOL ) injection 10 mg   And   diphenhydrAMINE  (BENADRYL ) injection 50 mg   And   LORazepam  (ATIVAN ) injection 2 mg   hydrOXYzine  (ATARAX ) tablet 25 mg   traZODone  (DESYREL ) tablet 50 mg   lamoTRIgine  (LAMICTAL ) tablet 150 mg   pantoprazole  (PROTONIX ) EC tablet 40 mg   cariprazine  (VRAYLAR ) capsule 3 mg   [COMPLETED] nicotine  polacrilex (NICORETTE ) gum 2 mg   PTA Medications  Medication Sig   hydrOXYzine  (ATARAX /VISTARIL ) 25 MG tablet Take 1 tablet (25 mg total) by mouth every 6 (six) hours as needed for anxiety.   cariprazine  (VRAYLAR ) 3 MG capsule Take 3 mg by mouth daily.   lamoTRIgine  (LAMICTAL ) 150 MG tablet Take 150 mg by mouth 2 (two) times daily.   mebendazole  (VERMOX ) 100 MG chewable tablet Chew 1 tablet (100 mg total) by mouth 2 (two) times daily. Repeat in 3 weeks    omeprazole  (PRILOSEC) 20 MG capsule Take 1 capsule (20 mg total) by mouth daily.   cariprazine  (VRAYLAR ) 3 MG capsule Take 3 mg by mouth daily.       09/13/2021    3:42 AM 09/10/2019   11:16 AM 09/05/2019    7:43 PM  Depression screen PHQ 2/9  Decreased Interest 3 3 3   Down, Depressed, Hopeless 3 3 3   PHQ - 2 Score 6 6 6   Altered sleeping 2 3 3   Tired, decreased energy 2 3 3   Change in appetite 2 3 3   Feeling bad or failure about yourself  3 3 3   Trouble concentrating 3 3 3   Moving slowly or fidgety/restless 3 2 3   Suicidal thoughts 1 3 3   PHQ-9 Score 22 26 27   Difficult doing work/chores Very difficult Extremely dIfficult     Flowsheet Row ED from 08/02/2023 in [facility] from [facility]/21/2023 in [facility] at [facility] ED from [facility]/12/2023 in [facility] Emergency Department [facility]  C-SSRS RISK CATEGORY High Risk No Risk No Risk       Musculoskeletal  Strength & Muscle Tone: within normal limits Gait & Station: normal Patient leans: Right  Psychiatric Specialty Exam  Presentation  General Appearance:  Casual; Disheveled  Eye Contact: Fleeting; Minimal  Speech: Clear and Coherent  Speech Volume: Normal  Handedness: Right   Mood and Affect  Mood: Anxious; Depressed; Hopeless  Affect: Depressed   Thought Process  Thought Processes: Coherent  Descriptions of Associations:Intact  Orientation:Full (Time, Place and Person)  Thought Content:WDL  Diagnosis of Schizophrenia or Schizoaffective disorder in past: Yes    Hallucinations:Hallucinations: None  Ideas of Reference:None  Suicidal Thoughts:Suicidal Thoughts: Yes, Passive SI Active Intent and/or Plan: Without Intent  Homicidal Thoughts:Homicidal Thoughts: No HI Active Intent and/or Plan: With Intent; With Plan   Sensorium  Memory: Immediate Fair; Recent Fair; Remote  Fair  Judgment: Fair  Insight: Fair   Chartered certified accountant: Fair  Attention Span: Fair  Recall: [employer] of Knowledge: Fair  Language: Fair   Psychomotor Activity  Psychomotor Activity: Psychomotor Activity: Restlessness   Assets  Assets: Manufacturing systems engineer; Desire for Improvement; Physical Health   Sleep  Sleep: Sleep:  Fair Number of Hours of Sleep: 5   Nutritional Assessment (For OBS and FBC admissions only) Has the patient had a weight loss or gain of 10 pounds or more in the last 3 months?: No Has the patient had a decrease in food intake/or appetite?: No Does the patient have dental problems?: No Does the patient have eating habits or behaviors that may be indicators of an eating disorder including binging or inducing vomiting?: No Has the patient recently lost weight without trying?: 0 Has the patient been eating poorly because of a decreased appetite?: 0 Malnutrition Screening Tool Score: 0    Physical Exam  Physical Exam Vitals and nursing note reviewed.  HENT:     Head: Normocephalic and atraumatic.     Right Ear: Tympanic membrane normal.     Left Ear: Tympanic membrane normal.     Nose: Nose normal.     Mouth/Throat:     Mouth: Mucous membranes are moist.  Eyes:     Extraocular Movements: Extraocular movements intact.     Pupils: Pupils are equal, round, and reactive to light.  Cardiovascular:     Rate and Rhythm: Normal rate.     Pulses: Normal pulses.  Pulmonary:     Effort: Pulmonary effort is normal.  Musculoskeletal:        General: Normal range of motion.     Cervical back: Normal range of motion and neck supple.  Neurological:     General: No focal deficit present.     Mental Status: He is alert and oriented to person, place, and time.    Review of Systems  Constitutional: Negative.   HENT: Negative.    Eyes: Negative.   Respiratory: Negative.    Gastrointestinal: Negative.   Genitourinary:  Negative.   Musculoskeletal: Negative.   Skin: Negative.   Neurological: Negative.   Endo/Heme/Allergies: Negative.   Psychiatric/Behavioral:  Positive for depression, substance abuse and suicidal ideas. The patient is nervous/anxious and has insomnia.    Blood pressure 113/78, pulse 86, temperature 98.7 F (37.1 C), temperature source Oral, resp. rate 19, SpO2 100%. There is no height or weight on file to calculate BMI.  Demographic Factors:  Male, Low socioeconomic status, and Unemployed  Loss Factors: Decrease in vocational status, Loss of significant relationship, and Financial problems/change in socioeconomic status  Historical Factors: Prior suicide attempts and Family history of mental illness or substance abuse  Risk Reduction Factors:   NA  Continued Clinical Symptoms:  Bipolar Disorder:   Depressive phase Depression:   Comorbid alcohol abuse/dependence Hopelessness Recent sense of peace/wellbeing Schizophrenia:   Depressive state Previous Psychiatric Diagnoses and Treatments  Cognitive Features That Contribute To Risk:  None    Suicide Risk:  Severe:  Frequent, intense, and enduring suicidal ideation, specific plan, no subjective intent, but some objective markers of intent (i.e., choice of lethal method), the method is accessible, some limited preparatory behavior, evidence of impaired self-control, severe dysphoria/symptomatology, multiple risk factors present, and few if any protective factors, particularly a lack of social support.  Plan Of Care/Follow-up recommendations:  Diet Regular Activities as tolerated  Disposition: Admitted to [facility] by Dr Zouev for treatment.   Elston Halsted, NP 08/03/2023, 10:25 AM

## 2023-08-03 NOTE — [facility] Group Notes (Signed)
 [facility] Group Notes:  (Nursing/MHT/Case Management/Adjunct)  Date:  08/03/2023  Time:  2000  Type of Therapy:  Wrap up group  Participation Level:  Active  Participation Quality:  Appropriate, Attentive, Sharing, and Supportive  Affect:  Flat  Cognitive:  Alert  Insight:  Improving  Engagement in Group:  Engaged  Modes of Intervention:  Clarification, Education, and Socialization  Summary of Progress/Problems: Positive thinking and self-care were discussed.   Metta Actis S 08/03/2023, 9:15 PM

## 2023-08-03 NOTE — ED Notes (Signed)
 Patient is A&O x4, calm, cooperative, tearful at times with a depressed affect and congruent mood. Patient denies SI, HI, AVH and does not appear to be responsive to internal stimuli. Patient endorses feeling alone in the world, feeling like no one care for him. Patient states he is in a relationship but doesn't feel the  love he give is reciprocated. Supportive listening and divisional activity provided.

## 2023-08-03 NOTE — Progress Notes (Signed)
 B[facility]/BMU LCSW Progress Note   08/03/2023    12:44 PM  Chris Olson.   [id number]   Type of Contact and Topic:  Psychiatric Bed Placement   Pt accepted to [facility] 300-1    Patient meets inpatient criteria per [name], NP   The attending provider will be Dr. [name]  Call report to [phone]  April Coleman, RN @ [facility] notified.     Pt scheduled  to arrive at [facility] TODAY.    Leanord Thibeau, MSW, LCSW-A  12:46 PM 08/03/2023

## 2023-08-03 NOTE — ED Provider Notes (Signed)
 [facility] Continuous Assessment Admission H&P  Date: 08/03/23 Patient Name: Torre Schaumburg. MRN: [id number] Chief Complaint: suicidal ideation  Diagnoses:  Final diagnoses:  Schizoaffective disorder, unspecified type (HCC)    HPI: Chukwuma Straus. is a 33 y.o. male, with PMH MDD, alcohol use d/o, cocaine use d/o, schizoaffective disorder presenting voluntarily to [facility] via [facility]. Patient reports worsening depression symptoms with suicidal ideation with a plan to overdose on cocaine. Patient also stated that he wanted to burn down the boarding house where he lives.  German Koller., 33 y.o., male patient seen face to face by this provider and chart reviewed on 08/02/23. During evaluation Detrell Trine Fulling. Is lying across two chairs in the assessment room but does not appear to be in any acute distress. He is alert, oriented x 4 but appears to be guarded and agitated. His mood is depressed with congruent affect. He has normal speech, and behavior.  Patient was irritable and stated that he was not going to ask anymore questions because he was tired, cold and sleepy. Patient stated that his parents do not care about him and no one has cared about him his whole life. Patient reports that these feelings seem to have gotten worse over the last week. Patient reports that receives medication management from Dr. Daina Drum at [place]. Patient reports that he is prescribed vraylar , lamictal  and hydroxyzine .   Patient recommended for inpatient treatment and will be admitted to [facility] continuous observation for crisis management, safety and stabilization until an inpatient bed can be identified.  Total Time spent with patient: 20 minutes  Musculoskeletal  Strength & Muscle Tone: within normal limits Gait & Station: normal Patient leans: N/A  Psychiatric Specialty Exam  Presentation General Appearance:  Casual  Eye Contact: Fleeting  Speech: Clear and Coherent  Speech  Volume: Normal  Handedness: Right   Mood and Affect  Mood: Depressed; Hopeless  Affect: Flat; Depressed   Thought Process  Thought Processes: Coherent  Descriptions of Associations:Intact  Orientation:No data recorded Thought Content:WDL  Diagnosis of Schizophrenia or Schizoaffective disorder in past: Yes   Hallucinations:Hallucinations: None  Ideas of Reference:None  Suicidal Thoughts:Suicidal Thoughts: Yes, Active SI Active Intent and/or Plan: With Intent; With Plan  Homicidal Thoughts:Homicidal Thoughts: Yes, Active HI Active Intent and/or Plan: With Intent; With Plan   Sensorium  Memory: Immediate Fair; Recent Fair; Remote Fair  Judgment: Poor  Insight: Poor   Executive Functions  Concentration: Poor  Attention Span: Poor  Recall: Poor  Fund of Knowledge: Fair  Language: Fair   Psychomotor Activity  Psychomotor Activity: Psychomotor Activity: Normal   Assets  Assets: Desire for Improvement; Physical Health   Sleep  Sleep: Sleep: Poor Number of Hours of Sleep: 2   Nutritional Assessment (For OBS and FBC admissions only) Has the patient had a weight loss or gain of 10 pounds or more in the last 3 months?: No Has the patient had a decrease in food intake/or appetite?: No Does the patient have dental problems?: No Does the patient have eating habits or behaviors that may be indicators of an eating disorder including binging or inducing vomiting?: No Has the patient recently lost weight without trying?: 0 Has the patient been eating poorly because of a decreased appetite?: 0 Malnutrition Screening Tool Score: 0    Physical Exam HENT:     Head: Normocephalic.     Nose: Nose normal.  Eyes:     Pupils: Pupils are equal, round, and reactive to  light.  Cardiovascular:     Rate and Rhythm: Normal rate.  Pulmonary:     Effort: Pulmonary effort is normal.  Abdominal:     General: Abdomen is flat.  Musculoskeletal:         General: Normal range of motion.     Cervical back: Normal range of motion.  Skin:    General: Skin is warm.  Neurological:     Mental Status: He is alert and oriented to person, place, and time.  Psychiatric:        Attention and Perception: He is inattentive.        Mood and Affect: Mood is depressed. Affect is angry.        Speech: Speech normal.        Behavior: Behavior is agitated.        Thought Content: Thought content includes homicidal and suicidal ideation. Thought content includes homicidal and suicidal plan.        Cognition and Memory: Cognition normal.        Judgment: Judgment is impulsive.    Review of Systems  Constitutional: Negative.   HENT: Negative.    Eyes: Negative.   Respiratory: Negative.    Cardiovascular: Negative.   Gastrointestinal: Negative.   Genitourinary: Negative.   Musculoskeletal: Negative.   Skin: Negative.   Neurological: Negative.   Endo/Heme/Allergies: Negative.   Psychiatric/Behavioral:  Positive for suicidal ideas.     Blood pressure 128/78, pulse 89, temperature 98.2 F (36.8 C), temperature source Oral, resp. rate 18, SpO2 98%. There is no height or weight on file to calculate BMI.  Past Psychiatric History: schizoaffective disorder  Is the patient at risk to self? Yes  Has the patient been a risk to self in the past 6 months? Yes .    Has the patient been a risk to self within the distant past? Yes   Is the patient a risk to others? Yes   Has the patient been a risk to others in the past 6 months? No   Has the patient been a risk to others within the distant past? No   Past Medical History: anxiety, schizophrenia, chest pain, depression and pericarditis  Family History: unknown  Social History: 33 y/o lives in a boarding house, h/o cocaine and marijuana use  Last Labs:  Admission on 08/02/2023  Component Date Value Ref Range Status   WBC 08/02/2023 10.5  4.0 - 10.5 K/uL Final   RBC 08/02/2023 4.79  4.22 - 5.81 MIL/uL  Final   Hemoglobin 08/02/2023 16.2  13.0 - 17.0 g/dL Final   HCT 16/11/9602 47.5  39.0 - 52.0 % Final   MCV 08/02/2023 99.2  80.0 - 100.0 fL Final   MCH 08/02/2023 33.8  26.0 - 34.0 pg Final   MCHC 08/02/2023 34.1  30.0 - 36.0 g/dL Final   RDW 54/10/8117 11.9  11.5 - 15.5 % Final   Platelets 08/02/2023 179  150 - 400 K/uL Final   nRBC 08/02/2023 0.0  0.0 - 0.2 % Final   Neutrophils Relative % 08/02/2023 80  % Final   Neutro Abs 08/02/2023 8.4 (H)  1.7 - 7.7 K/uL Final   Lymphocytes Relative 08/02/2023 13  % Final   Lymphs Abs 08/02/2023 1.4  0.7 - 4.0 K/uL Final   Monocytes Relative 08/02/2023 5  % Final   Monocytes Absolute 08/02/2023 0.6  0.1 - 1.0 K/uL Final   Eosinophils Relative 08/02/2023 1  % Final   Eosinophils Absolute 08/02/2023 0.1  0.0 - 0.5 K/uL Final   Basophils Relative 08/02/2023 1  % Final   Basophils Absolute 08/02/2023 0.1  0.0 - 0.1 K/uL Final   Immature Granulocytes 08/02/2023 0  % Final   Abs Immature Granulocytes 08/02/2023 0.03  0.00 - 0.07 K/uL Final   Performed at [facility], [address] [place], [place] [address]   Sodium 08/02/2023 138  135 - 145 mmol/L Final   Potassium 08/02/2023 4.4  3.5 - 5.1 mmol/L Final   Chloride 08/02/2023 103  98 - 111 mmol/L Final   CO2 08/02/2023 23  22 - 32 mmol/L Final   Glucose, Bld 08/02/2023 114 (H)  70 - 99 mg/dL Final   Glucose reference range applies only to samples taken after fasting for at least 8 hours.   BUN 08/02/2023 13  6 - 20 mg/dL Final   Creatinine, Ser 08/02/2023 0.99  0.61 - 1.24 mg/dL Final   Calcium 32/44/0102 9.3  8.9 - 10.3 mg/dL Final   Total Protein 72/53/6644 6.3 (L)  6.5 - 8.1 g/dL Final   Albumin 03/47/4259 4.4  3.5 - 5.0 g/dL Final   AST 56/38/7564 34  15 - 41 U/L Final   ALT 08/02/2023 25  0 - 44 U/L Final   Alkaline Phosphatase 08/02/2023 51  38 - 126 U/L Final   Total Bilirubin 08/02/2023 1.1  0.0 - 1.2 mg/dL Final   GFR, Estimated 08/02/2023 >60  >60 mL/min Final   Comment:  (NOTE) Calculated using the CKD-EPI Creatinine Equation (2021)    Anion gap 08/02/2023 12  5 - 15 Final   Performed at [facility], [address], [place], [place] [address]   Alcohol, Ethyl (B) 08/02/2023 <15  <15 mg/dL Final   Comment: (NOTE) For medical purposes only. Performed at [facility], [address], [place], [place] [address]    POC Amphetamine UR 08/02/2023 None Detected  NONE DETECTED (Cut Off Level 1000 ng/mL) Final   POC Secobarbital (BAR) 08/02/2023 None Detected  NONE DETECTED (Cut Off Level 300 ng/mL) Final   POC Buprenorphine (BUP) 08/02/2023 None Detected  NONE DETECTED (Cut Off Level 10 ng/mL) Final   POC Oxazepam (BZO) 08/02/2023 None Detected  NONE DETECTED (Cut Off Level 300 ng/mL) Final   POC Cocaine UR 08/02/2023 Positive (A)  NONE DETECTED (Cut Off Level 300 ng/mL) Final   POC Methamphetamine UR 08/02/2023 None Detected  NONE DETECTED (Cut Off Level 1000 ng/mL) Final   POC Morphine 08/02/2023 None Detected  NONE DETECTED (Cut Off Level 300 ng/mL) Final   POC Methadone UR 08/02/2023 None Detected  NONE DETECTED (Cut Off Level 300 ng/mL) Final   POC Oxycodone UR 08/02/2023 None Detected  NONE DETECTED (Cut Off Level 100 ng/mL) Final   POC Marijuana UR 08/02/2023 Positive (A)  NONE DETECTED (Cut Off Level 50 ng/mL) Final  Admission on 04/12/2023, Discharged on 04/12/2023  Component Date Value Ref Range Status   Lipase 04/12/2023 49  11 - 51 U/L Final   Performed at [facility], [address] [place], [place] [address]   Sodium 04/12/2023 133 (L)  135 - 145 mmol/L Final   Potassium 04/12/2023 3.8  3.5 - 5.1 mmol/L Final   Chloride 04/12/2023 103  98 - 111 mmol/L Final   CO2 04/12/2023 23  22 - 32 mmol/L Final   Glucose, Bld 04/12/2023 130 (H)  70 - 99 mg/dL Final   Glucose reference range applies only to samples  taken after fasting for at least 8 hours.   BUN 04/12/2023 10  6 - 20 mg/dL Final   Creatinine, Ser 04/12/2023 0.94  0.61  - 1.24 mg/dL Final   Calcium 16/11/9602 8.3 (L)  8.9 - 10.3 mg/dL Final   Total Protein 54/10/8117 5.7 (L)  6.5 - 8.1 g/dL Final   Albumin 14/78/2956 3.8  3.5 - 5.0 g/dL Final   AST 21/30/8657 36  15 - 41 U/L Final   ALT 04/12/2023 32  0 - 44 U/L Final   Alkaline Phosphatase 04/12/2023 49  38 - 126 U/L Final   Total Bilirubin 04/12/2023 0.2  0.0 - 1.2 mg/dL Final   GFR, Estimated 04/12/2023 >60  >60 mL/min Final   Comment: (NOTE) Calculated using the CKD-EPI Creatinine Equation (2021)    Anion gap 04/12/2023 7  5 - 15 Final   Performed at [facility], [address] [place], [place] [address]   WBC 04/12/2023 6.9  4.0 - 10.5 K/uL Final   RBC 04/12/2023 4.10 (L)  4.22 - 5.81 MIL/uL Final   Hemoglobin 04/12/2023 14.1  13.0 - 17.0 g/dL Final   HCT 29/52/8413 39.9  39.0 - 52.0 % Final   MCV 04/12/2023 97.3  80.0 - 100.0 fL Final   MCH 04/12/2023 34.4 (H)  26.0 - 34.0 pg Final   MCHC 04/12/2023 35.3  30.0 - 36.0 g/dL Final   RDW 24/40/1027 11.8  11.5 - 15.5 % Final   Platelets 04/12/2023 144 (L)  150 - 400 K/uL Final   nRBC 04/12/2023 0.0  0.0 - 0.2 % Final   Performed at [facility], [address] [place], [place] [address]   Color, Urine 04/12/2023 YELLOW  YELLOW Final   APPearance 04/12/2023 CLEAR  CLEAR Final   Specific Gravity, Urine 04/12/2023 >=1.030  1.005 - 1.030 Final   pH 04/12/2023 5.5  5.0 - 8.0 Final   Glucose, UA 04/12/2023 NEGATIVE  NEGATIVE mg/dL Final   Hgb urine dipstick 04/12/2023 NEGATIVE  NEGATIVE Final   Bilirubin Urine 04/12/2023 NEGATIVE  NEGATIVE Final   Ketones, ur 04/12/2023 NEGATIVE  NEGATIVE mg/dL Final   Protein, ur 44/04/4740 NEGATIVE  NEGATIVE mg/dL Final   Nitrite 59/56/3875 NEGATIVE  NEGATIVE Final   Leukocytes,Ua 04/12/2023 TRACE (A)  NEGATIVE Corrected   Comment: CORRECTED RESULTS CALLED TO: Guido Leeks RN [id number] 04/18/2023 Jenne Moat Performed at [facility], [address] [place], [place]  [address] CORRECTED ON 02/20 AT 0413: PREVIOUSLY REPORTED AS NEGATIVE Microscopic not done on urines with negative protein, blood, leukocytes, nitrite, or glucose < 500 mg/dL.   Admission on 04/02/2023, Discharged on 04/02/2023  Component Date Value Ref Range Status   WBC 04/01/2023 8.3  4.0 - 10.5 K/uL Final   RBC 04/01/2023 4.29  4.22 - 5.81 MIL/uL Final   Hemoglobin 04/01/2023 14.6  13.0 - 17.0 g/dL Final   HCT 84/16/6063 41.5  39.0 - 52.0 % Final   MCV 04/01/2023 96.7  80.0 - 100.0 fL Final   MCH 04/01/2023 34.0  26.0 - 34.0 pg Final   MCHC 04/01/2023 35.2  30.0 - 36.0 g/dL Final   RDW 01/60/1093 11.8  11.5 - 15.5 % Final   Platelets 04/01/2023 163  150 - 400 K/uL Final   nRBC 04/01/2023 0.0  0.0 - 0.2 % Final   Neutrophils Relative % 04/01/2023 72  % Final   Neutro Abs 04/01/2023 6.0  1.7 - 7.7 K/uL Final  Lymphocytes Relative 04/01/2023 19  % Final   Lymphs Abs 04/01/2023 1.6  0.7 - 4.0 K/uL Final   Monocytes Relative 04/01/2023 6  % Final   Monocytes Absolute 04/01/2023 0.5  0.1 - 1.0 K/uL Final   Eosinophils Relative 04/01/2023 2  % Final   Eosinophils Absolute 04/01/2023 0.2  0.0 - 0.5 K/uL Final   Basophils Relative 04/01/2023 1  % Final   Basophils Absolute 04/01/2023 0.1  0.0 - 0.1 K/uL Final   Immature Granulocytes 04/01/2023 0  % Final   Abs Immature Granulocytes 04/01/2023 0.03  0.00 - 0.07 K/uL Final   Performed at [facility], [address] [place], [place] [address]   Sodium 04/01/2023 134 (L)  135 - 145 mmol/L Final   Potassium 04/01/2023 4.1  3.5 - 5.1 mmol/L Final   Chloride 04/01/2023 102  98 - 111 mmol/L Final   CO2 04/01/2023 21 (L)  22 - 32 mmol/L Final   Glucose, Bld 04/01/2023 97  70 - 99 mg/dL Final   Glucose reference range applies only to samples taken after fasting for at least 8 hours.   BUN 04/01/2023 8  6 - 20 mg/dL Final   Creatinine, Ser 04/01/2023 0.83  0.61 - 1.24 mg/dL Final   Calcium 69/62/9528 9.4  8.9 - 10.3 mg/dL Final   Total  Protein 04/01/2023 7.3  6.5 - 8.1 g/dL Final   Albumin 41/32/4401 4.5  3.5 - 5.0 g/dL Final   AST 02/72/5366 34  15 - 41 U/L Final   ALT 04/01/2023 29  0 - 44 U/L Final   Alkaline Phosphatase 04/01/2023 51  38 - 126 U/L Final   Total Bilirubin 04/01/2023 1.1  0.0 - 1.2 mg/dL Final   GFR, Estimated 04/01/2023 >60  >60 mL/min Final   Comment: (NOTE) Calculated using the CKD-EPI Creatinine Equation (2021)    Anion gap 04/01/2023 11  5 - 15 Final   Performed at [facility], [address] [place], [place] [address]   Color, Urine 04/01/2023 YELLOW  YELLOW Final   APPearance 04/01/2023 CLEAR  CLEAR Final   Specific Gravity, Urine 04/01/2023 1.010  1.005 - 1.030 Final   pH 04/01/2023 7.0  5.0 - 8.0 Final   Glucose, UA 04/01/2023 NEGATIVE  NEGATIVE mg/dL Final   Hgb urine dipstick 04/01/2023 NEGATIVE  NEGATIVE Final   Bilirubin Urine 04/01/2023 NEGATIVE  NEGATIVE Final   Ketones, ur 04/01/2023 NEGATIVE  NEGATIVE mg/dL Final   Protein, ur 74/25/9563 NEGATIVE  NEGATIVE mg/dL Final   Nitrite 87/56/4332 NEGATIVE  NEGATIVE Final   Leukocytes,Ua 04/01/2023 TRACE (A)  NEGATIVE Final   Performed at [facility], [address] [place], [place] [address]   RBC / HPF 04/01/2023 NONE SEEN  0 - 5 RBC/hpf Final   WBC, UA 04/01/2023 0-5  0 - 5 WBC/hpf Final   Bacteria, UA 04/01/2023 RARE (A)  NONE SEEN Final   Squamous Epithelial / HPF 04/01/2023 0-5  0 - 5 /HPF Final   Performed at [facility], [address] [place], [place] [address]    Allergies: Patient has no known allergies.  Medications:  Facility Ordered Medications  Medication   acetaminophen  (TYLENOL ) tablet 650 mg   alum & mag hydroxide-simeth (MAALOX/MYLANTA) 200-200-20 MG/5ML suspension 30 mL   magnesium  hydroxide (MILK OF MAGNESIA) suspension 30 mL   haloperidol  (HALDOL ) tablet 5 mg   And   diphenhydrAMINE  (BENADRYL ) capsule 50 mg  haloperidol  lactate (HALDOL ) injection 5 mg   And    diphenhydrAMINE  (BENADRYL ) injection 50 mg   And   LORazepam  (ATIVAN ) injection 2 mg   haloperidol  lactate (HALDOL ) injection 10 mg   And   diphenhydrAMINE  (BENADRYL ) injection 50 mg   And   LORazepam  (ATIVAN ) injection 2 mg   hydrOXYzine  (ATARAX ) tablet 25 mg   traZODone  (DESYREL ) tablet 50 mg   lamoTRIgine  (LAMICTAL ) tablet 150 mg   pantoprazole  (PROTONIX ) EC tablet 40 mg   cariprazine  (VRAYLAR ) capsule 3 mg   PTA Medications  Medication Sig   hydrOXYzine  (ATARAX /VISTARIL ) 25 MG tablet Take 1 tablet (25 mg total) by mouth every 6 (six) hours as needed for anxiety.   cariprazine  (VRAYLAR ) 3 MG capsule Take 3 mg by mouth daily.   lamoTRIgine  (LAMICTAL ) 150 MG tablet Take 150 mg by mouth 2 (two) times daily.   mebendazole  (VERMOX ) 100 MG chewable tablet Chew 1 tablet (100 mg total) by mouth 2 (two) times daily. Repeat in 3 weeks   omeprazole  (PRILOSEC) 20 MG capsule Take 1 capsule (20 mg total) by mouth daily.   cariprazine  (VRAYLAR ) 3 MG capsule Take 3 mg by mouth daily.      Medical Decision Making  Townes Jahziah Simonin. is a 33 y.o. male, with PMH MDD, alcohol use d/o, cocaine use d/o, schizoaffective disorder presenting voluntarily to [facility] via [employer]. Patient reports worsening depression symptoms with suicidal ideation with a plan to overdose on cocaine. Patient also stated that he wanted to burn down the boarding house where lived.    Recommendations  Based on my evaluation the patient does not appear to have an emergency medical condition. Patient recommended for inpatient treatment and will be admitted to [facility] continuous observation for crisis management, safety and stabilization until an inpatient bed can be identified.  Maddison Kilner E Khristy Kalan, NP 08/03/23  7:11 AM

## 2023-08-03 NOTE — ED Notes (Signed)
 Patient to be transferred to [facility]. Report has been called to accepting nurse.

## 2023-08-04 DIAGNOSIS — F1220 Cannabis dependence, uncomplicated: Secondary | ICD-10-CM | POA: Insufficient documentation

## 2023-08-04 DIAGNOSIS — F1020 Alcohol dependence, uncomplicated: Secondary | ICD-10-CM | POA: Diagnosis not present

## 2023-08-04 DIAGNOSIS — F1420 Cocaine dependence, uncomplicated: Secondary | ICD-10-CM | POA: Insufficient documentation

## 2023-08-04 DIAGNOSIS — F4323 Adjustment disorder with mixed anxiety and depressed mood: Secondary | ICD-10-CM | POA: Insufficient documentation

## 2023-08-04 MED ORDER — MELATONIN 5 MG PO TABS
5.0000 mg | ORAL_TABLET | Freq: Every day | ORAL | Status: DC
  Administered 2023-08-04 – 2023-08-05 (×2): 5 mg via ORAL
  Filled 2023-08-04 (×2): qty 1

## 2023-08-04 MED ORDER — POLYETHYLENE GLYCOL 3350 17 G PO PACK
17.0000 g | PACK | Freq: Every day | ORAL | Status: DC
  Administered 2023-08-04 – 2023-08-05 (×2): 17 g via ORAL
  Filled 2023-08-04 (×2): qty 1

## 2023-08-04 MED ORDER — SENNOSIDES-DOCUSATE SODIUM 8.6-50 MG PO TABS
1.0000 | ORAL_TABLET | Freq: Every day | ORAL | Status: DC
  Administered 2023-08-04: 1 via ORAL
  Filled 2023-08-04 (×2): qty 1

## 2023-08-04 MED ORDER — NICOTINE POLACRILEX 2 MG MT GUM
2.0000 mg | CHEWING_GUM | OROMUCOSAL | Status: DC | PRN
  Administered 2023-08-04 – 2023-08-06 (×7): 2 mg via ORAL
  Filled 2023-08-04 (×2): qty 1

## 2023-08-04 MED ORDER — LORAZEPAM 1 MG PO TABS: 1.0000 mg | ORAL_TABLET | Freq: Two times a day (BID) | ORAL | Status: DC | PRN

## 2023-08-04 NOTE — [facility] Group Notes (Signed)
 [facility] Group Notes:  (Nursing/MHT/Case Management/Adjunct)  Date:  08/04/2023  Time:  2000  Type of Therapy:  Wrap up group  Participation Level:  Active  Participation Quality:  Appropriate, Attentive, Sharing, and Supportive  Affect:  Appropriate  Cognitive:  Alert  Insight:  Improving  Engagement in Group:  Engaged  Modes of Intervention:  Clarification, Education, and Support  Summary of Progress/Problems: Positive thinking and positive change were discussed.   Chris Olson 08/04/2023, 9:11 PM

## 2023-08-04 NOTE — Group Note (Signed)
 Date:  08/04/2023 Time:  5:16 PM  Group Topic/Focus:   Managing Stress:   The focus of this group is to identify ways to manage stress and how patients can apply these strategies in their personal lives.     Participation Level:  Did Not Attend   Sheryl Donna 08/04/2023, 5:16 PM

## 2023-08-04 NOTE — Progress Notes (Signed)
   08/04/23 0530  15 Minute Checks  Location Bathroom/Shower  Visual Appearance Calm  Behavior Composed  Sleep (Behavioral Health Patients Only)  Calculate sleep? (Click Yes once per 24 hr at 0600 safety check) Yes  Documented sleep last 24 hours 3

## 2023-08-04 NOTE — Progress Notes (Signed)
   08/04/23 1000  Psych Admission Type (Psych Patients Only)  Admission Status Voluntary  Psychosocial Assessment  Patient Complaints Sleep disturbance  Eye Contact Fair  Facial Expression Sad  Affect Appropriate to circumstance  Speech Logical/coherent  Interaction Assertive  Motor Activity Other (Comment) (steady gait)  Appearance/Hygiene In scrubs  Behavior Characteristics Cooperative;Calm  Mood Pleasant;Sad  Thought Process  Coherency WDL  Content WDL  Delusions None reported or observed  Perception WDL  Hallucination None reported or observed  Judgment Poor  Confusion None  Danger to Self  Current suicidal ideation? Denies  Self-Injurious Behavior No self-injurious ideation or behavior indicators observed or expressed   Danger to Others  Danger to Others None reported or observed

## 2023-08-04 NOTE — Group Note (Signed)
 LCSW Group Therapy Note  Group Date: 08/04/2023 Start Time: 1030 End Time: 1230   Type of Therapy and Topic: Soothing Skills  Participation Level:  Active   Description of Group The focus of this group was to determine what unhealthy coping techniques typically are used by group members and what healthy coping techniques would be helpful in coping with various problems. Patients were guided in becoming aware of the differences between healthy and unhealthy coping techniques. Patients were asked to identify 2-3 healthy coping skills they would like to learn to use more effectively.  Therapeutic Goals Patients learned that coping is what human beings do all day long to deal with various situations in their lives Patients defined and discussed healthy vs unhealthy coping techniques Patients identified their preferred coping techniques and identified whether these were healthy or unhealthy Patients determined 2-3 healthy coping skills they would like to become more familiar with and use more often. Patients provided support and ideas to each other   Summary of Patient Progress:  During group, patient was very expressive in ways he managed stress. Patient proved open to input from peers and feedback from CSW. Patient demonstrated great insight into the subject matter, was respectful of peers, and participated throughout the entire session.   Therapeutic Modalities Cognitive Behavioral Therapy DBT   Mariano Shiver, LCSWA 08/04/2023  12:44 PM

## 2023-08-04 NOTE — H&P (Addendum)
 Psychiatric Admission Assessment Adult  Patient Identification: Chris Olson. MRN:  [id number] Date of Evaluation:  08/04/2023  Chief Complaint:  MDD (major depressive disorder), recurrent episode, severe (HCC) [F33.2],  Moderate alcohol use disorder (HCC)  Principal Problem:   Moderate alcohol use disorder (HCC) Active Problems:   Cocaine use disorder, moderate, in controlled environment (HCC)   Moderate cannabis use disorder (HCC)   Adjustment disorder with mixed anxiety and depressed mood   History of Present Illness:  Chris Marti Acebo. is a 33 y.o., male with history of MDD, alcohol use disorder, stimulant use disorder-cocaine, substance induced psychosis, schizoaffective disorder bipolar type, PTSD presented to [facility] 6/6 via [facility] due to SI with plan to overdose on cocaine and alcohol or "burn down the boarding house" he resides in. He presently receives psychiatric care at [facility] and has been medication compliant.  He has been taking lamotrigine  150 mg twice daily and cariprazine  3 mg daily. Primary stressors have been arguments with girlfriend, estrangement from family, and grief from mom passing 3 years ago. Reportedly used unknown amount of liquor and beer, 1/2 gram of cocaine, and unknown amt of cannabis the night before arriving at [facility]. He has had similar psychiatric admissions at [facility] once in 2021 and 3 times in 2019.   Patient reports that she has he recently has had significant stress due to feelings of argument with girlfriend and this resulted in significant dysphoria because he felt that she was breaking up with him.  He had called her multiple times and nobody answered.  This resulted in him feeling very isolated and that nobody cared for him so he called 911 and had himself brought to [facility].  He reports being estranged from family and minimal social support in [place].  He reports he regularly keeps to himself with the exception of girlfriend and girlfriend's daughters.  He  reports that prior to this argument with girlfriend, he did not experience any persistent depressed mood nor anhedonia.  He denies experiencing any problems with appetite.  He reports restless sleep at times but he is able to fall asleep for 5-6 hours and awakens feeling well-rested. He denies history of mania nor psychosis. He reports last time he felt suicidal had been the last time he was hospitalized in 2021. Patient is able to contract for safety while on the unit.  He reports that since being hospitalized, he has talked with girlfriend and discovered that her girlfriend is not actually planning on leaving which immediately improved his mood. Girlfriend is staying with one of her daughters because he wanted to "cool off" and did not answer his phone calls because she was still experiencing anger and did not want to talk with them at that moment.  Patient reports smoking marijuana every day to aid with "stress". He reports daily alcohol use approximately 1 to 2 40 ounce beer. He reports experiencing difficulty cutting down on alcohol use, himself taking more than anticipated, continued drinking despite it making him feel depressed, and has a history of tremulousness after attempting to quit alcohol. He denies ever experiencing seizures or delirium tremens or required medical hospitalization for alcohol use.   He reports using cocaine about a 1-2 more times per month although is not certain why he uses cocaine. He reports previously having had success with AA and is looking to return to after discharge.  We also discussed him attending [facility] which he was amenable to trying out.  Patient is contemplative regarding cannabis cessation but  is motivated to quit nicotine , cocaine, and especially alcohol.    Past Psychiatric History:  Previous psych diagnoses: MDD, alcohol use disorder, stimulant use disorder, substance induced psychotic disorder, schizoaffective disorder bipolar type, PTSD Prior inpatient  psychiatric treatment: At least 4, last [facility] hospitalization being in 2021 Current psychiatric provider: Sees psychiatric provider at [facility]  Neuromodulation history: denies  Current therapist: Dr. Hildy Lowers at [facility] Psych[facility] hx: endorses  History of suicide attempts: Reports attempting to overdose in the past History of homicide: Denies  Psychotropic medications: Current Cariprazine  3 mg and lamotrigine  150 mg bid - patient reportedly taking consistently, reports fair response  Past Quetiapine , cariprazine , lamotrigine , sertraline   Substance Use History: Alcohol: drinks 1-2 40 oz of beer daily Hx withdrawal tremors/shakes: endorses Hx alcohol related blackouts: denies Hx alcohol induced hallucinations: denies Hx alcoholic seizures: denies Hx medical hospitalization due to severe alcohol withdrawal symptoms: denies DUI: denies    Is the patient at risk to self? Yes Has the patient been a risk to self in the past 6 months? No Has the patient been a risk to self within the distant past? Yes Is the patient a risk to others? No Has the patient been a risk to others in the past 6 months? No Has the patient been a risk to others within the distant past? No  Alcohol Screening:   Tobacco Screening:    Substance Abuse History in the last 12 months: Yes  Allergies:  No Known Allergies  Past Medical/Surgical History:  Medical Diagnoses: GERD Home Rx: Omeprazole  Prior Hosp: Denies Prior Surgeries / non-head trauma: Denies Family History:  History reviewed. No pertinent family history.  Social History:  Social History   Tobacco Use   Smoking status: Every Day    Current packs/day: 0.75    Types: Cigarettes   Smokeless tobacco: Never  Vaping Use   Vaping status: Never Used  Substance Use Topics   Alcohol use: Yes    Alcohol/week: 15.0 standard drinks of alcohol    Types: 15 Cans of beer per week    Comment: (2) 40 oz beers per day most days   Drug use: Yes     Types: Marijuana, Cocaine    Lab Results:  Results for orders placed or performed during the hospital encounter of 08/02/23 (from the past 48 hours)  CBC with Differential/Platelet     Status: Abnormal   Collection Time: 08/02/23  9:45 PM  Result Value Ref Range   WBC 10.5 4.0 - 10.5 K/uL   RBC 4.79 4.22 - 5.81 MIL/uL   Hemoglobin 16.2 13.0 - 17.0 g/dL   HCT 11.9 14.7 - 82.9 %   MCV 99.2 80.0 - 100.0 fL   MCH 33.8 26.0 - 34.0 pg   MCHC 34.1 30.0 - 36.0 g/dL   RDW 56.2 13.0 - 86.5 %   Platelets 179 150 - 400 K/uL   nRBC 0.0 0.0 - 0.2 %   Neutrophils Relative % 80 %   Neutro Abs 8.4 (H) 1.7 - 7.7 K/uL   Lymphocytes Relative 13 %   Lymphs Abs 1.4 0.7 - 4.0 K/uL   Monocytes Relative 5 %   Monocytes Absolute 0.6 0.1 - 1.0 K/uL   Eosinophils Relative 1 %   Eosinophils Absolute 0.1 0.0 - 0.5 K/uL   Basophils Relative 1 %   Basophils Absolute 0.1 0.0 - 0.1 K/uL   Immature Granulocytes 0 %   Abs Immature Granulocytes 0.03 0.00 - 0.07 K/uL    Comment: Performed at  [facility], [address] [place], [place] [address]  Comprehensive metabolic panel     Status: Abnormal   Collection Time: 08/02/23  9:45 PM  Result Value Ref Range   Sodium 138 135 - 145 mmol/L   Potassium 4.4 3.5 - 5.1 mmol/L   Chloride 103 98 - 111 mmol/L   CO2 23 22 - 32 mmol/L   Glucose, Bld 114 (H) 70 - 99 mg/dL    Comment: Glucose reference range applies only to samples taken after fasting for at least 8 hours.   BUN 13 6 - 20 mg/dL   Creatinine, Ser 5.64 0.61 - 1.24 mg/dL   Calcium 9.3 8.9 - 33.2 mg/dL   Total Protein 6.3 (L) 6.5 - 8.1 g/dL   Albumin 4.4 3.5 - 5.0 g/dL   AST 34 15 - 41 U/L   ALT 25 0 - 44 U/L   Alkaline Phosphatase 51 38 - 126 U/L   Total Bilirubin 1.1 0.0 - 1.2 mg/dL   GFR, Estimated >95 >18 mL/min    Comment: (NOTE) Calculated using the CKD-EPI Creatinine Equation (2021)    Anion gap 12 5 - 15    Comment: Performed at [facility], [address] [place],  [place] [address]  Ethanol     Status: None   Collection Time: 08/02/23  9:45 PM  Result Value Ref Range   Alcohol, Ethyl (B) <15 <15 mg/dL    Comment: (NOTE) For medical purposes only. Performed at [facility], [address], [place], [place] [address]   POCT Urine Drug Screen - (I-Screen)     Status: Abnormal   Collection Time: 08/02/23  9:47 PM  Result Value Ref Range   POC Amphetamine UR None Detected NONE DETECTED (Cut Off Level 1000 ng/mL)   POC Secobarbital (BAR) None Detected NONE DETECTED (Cut Off Level 300 ng/mL)   POC Buprenorphine (BUP) None Detected NONE DETECTED (Cut Off Level 10 ng/mL)   POC Oxazepam (BZO) None Detected NONE DETECTED (Cut Off Level 300 ng/mL)   POC Cocaine UR Positive (A) NONE DETECTED (Cut Off Level 300 ng/mL)   POC Methamphetamine UR None Detected NONE DETECTED (Cut Off Level 1000 ng/mL)   POC Morphine None Detected NONE DETECTED (Cut Off Level 300 ng/mL)   POC Methadone UR None Detected NONE DETECTED (Cut Off Level 300 ng/mL)   POC Oxycodone UR None Detected NONE DETECTED (Cut Off Level 100 ng/mL)   POC Marijuana UR Positive (A) NONE DETECTED (Cut Off Level 50 ng/mL)    Blood Alcohol level:  Lab Results  Component Value Date   ETH <15 08/02/2023   ETH 86 (H) 09/13/2021    Metabolic Disorder Labs:  Lab Results  Component Value Date   HGBA1C 5.0 09/13/2021   MPG 96.8 09/13/2021   MPG 96.8 08/24/2019   No results found for: "PROLACTIN" Lab Results  Component Value Date   CHOL 198 09/13/2021   TRIG 123 09/13/2021   HDL 63 09/13/2021   CHOLHDL 3.1 09/13/2021   VLDL 25 09/13/2021   LDLCALC 110 (H) 09/13/2021   LDLCALC 148 (H) 08/24/2019    Current Medications: Current Facility-Administered Medications  Medication Dose Route Frequency Provider Last Rate Last Admin   acetaminophen  (TYLENOL ) tablet 650 mg  650 mg Oral Q6H PRN Gilman Lade, NP       alum & mag hydroxide-simeth (MAALOX/MYLANTA) 200-200-20 MG/5ML suspension 30 mL   30 mL Oral Q4H PRN Gilman Lade, NP  cariprazine  (VRAYLAR ) capsule 3 mg  3 mg Oral Daily Byungura, Veronique M, NP   3 mg at 08/04/23 3810   haloperidol  (HALDOL ) tablet 5 mg  5 mg Oral TID PRN Gilman Lade, NP       And   diphenhydrAMINE  (BENADRYL ) capsule 50 mg  50 mg Oral TID PRN Gilman Lade, NP       haloperidol  lactate (HALDOL ) injection 5 mg  5 mg Intramuscular TID PRN Gilman Lade, NP       And   diphenhydrAMINE  (BENADRYL ) injection 50 mg  50 mg Intramuscular TID PRN Gilman Lade, NP       And   LORazepam  (ATIVAN ) injection 2 mg  2 mg Intramuscular TID PRN Gilman Lade, NP       hydrOXYzine  (ATARAX ) tablet 25 mg  25 mg Oral TID PRN Gilman Lade, NP   25 mg at 08/03/23 2135   lamoTRIgine  (LAMICTAL ) tablet 150 mg  150 mg Oral BID Gilman Lade, NP   150 mg at 08/04/23 1751   LORazepam  (ATIVAN ) tablet 1 mg  1 mg Oral BID PRN Augusta Blizzard, MD       magnesium  hydroxide (MILK OF MAGNESIA) suspension 30 mL  30 mL Oral Daily PRN Gilman Lade, NP   30 mL at 08/04/23 0750   melatonin tablet 5 mg  5 mg Oral QHS Tristian Bouska, MD       nicotine  polacrilex (NICORETTE ) gum 2 mg  2 mg Oral PRN Zouev, Dmitri, MD   2 mg at 08/04/23 0258   pantoprazole  (PROTONIX ) EC tablet 40 mg  40 mg Oral Daily Byungura, Veronique M, NP   40 mg at 08/04/23 5277   traZODone  (DESYREL ) tablet 50 mg  50 mg Oral QHS PRN Byungura, Veronique M, NP        PTA Medications: Medications Prior to Admission  Medication Sig Dispense Refill Last Dose/Taking   cariprazine  (VRAYLAR ) 3 MG capsule Take 3 mg by mouth daily.      cariprazine  (VRAYLAR ) 3 MG capsule Take 3 mg by mouth daily.      hydrOXYzine  (ATARAX /VISTARIL ) 25 MG tablet Take 1 tablet (25 mg total) by mouth every 6 (six) hours as needed for anxiety. 75 tablet 0    lamoTRIgine  (LAMICTAL ) 150 MG tablet Take 150 mg by mouth 2 (two) times daily.      omeprazole  (PRILOSEC) 20 MG capsule Take 1  capsule (20 mg total) by mouth daily. 30 capsule 0     Physical Findings: AIMS: No  CIWA:    COWS:     Psychiatric Specialty Exam: General Appearance:  Casual; Disheveled   Eye Contact:  Fair   Speech:  Pressured   Volume:  Normal   Mood:  Euthymic   Affect:  Appropriate; Congruent   Thought Content:  Perseveration   Suicidal Thoughts:  Suicidal Thoughts: No SI Active Intent and/or Plan: Without Intent   Homicidal Thoughts:  Homicidal Thoughts: No   Thought Process:  Disorganized   Orientation:  Full (Time, Place and Person)     Memory:  Remote Fair   Judgment:  Fair   Insight:  Fair   Concentration:  Fair   Recall:  Fair   Fund of Knowledge:  Fair   Language:  Fair   Psychomotor Activity:  Psychomotor Activity: Restlessness   Assets:  Manufacturing systems engineer; Desire for Improvement; Physical Health   Sleep:  Sleep: Fair Number of Hours of Sleep: 5  Review of Systems Review of Systems  Respiratory:  Negative for shortness of breath.   Cardiovascular:  Negative for chest pain.  Gastrointestinal:  Negative for abdominal pain, constipation, diarrhea, heartburn, nausea and vomiting.  Neurological:  Negative for headaches.    Vital signs: Blood pressure 114/86, pulse 98, temperature 98.7 F (37.1 C), temperature source Oral, resp. rate 18, height 5\' 8"  (1.727 m), weight 58.5 kg, SpO2 100%. Body mass index is 19.61 kg/m. Physical Exam Constitutional:      Appearance: Normal appearance.  HENT:     Head: Normocephalic and atraumatic.  Eyes:     Extraocular Movements: Extraocular movements intact.     Conjunctiva/sclera: Conjunctivae normal.     Pupils: Pupils are equal, round, and reactive to light.  Cardiovascular:     Rate and Rhythm: Normal rate.     Pulses: Normal pulses.  Pulmonary:     Effort: Pulmonary effort is normal.  Musculoskeletal:        General: Normal range of motion.     Cervical back: Normal range of  motion.  Skin:    General: Skin is warm and dry.  Neurological:     General: No focal deficit present.     Mental Status: He is alert and oriented to person, place, and time. Mental status is at baseline.     Assets  Assets:Communication Skills; Desire for Improvement; Physical Health   Treatment Plan Summary: Daily contact with patient to assess and evaluate symptoms and progress in treatment and medication management  ASSESSMENT: Patient has had several hospitalizations/[facility] visits due to interpersonal relationship difficulties.  Most consistent with present diagnosis is either substance-induced mood disorder or adjustment disorder with mixed anxiety and depressed mood.  It is unclear whether the patient was currently minimizing symptoms due to wanting to leave the hospital to see girlfriend. He denied suicidal ideation today.  Based on assessment today, it does not appear that patient's symptoms with history of mental health problems are consistent with schizoaffective disorder bipolar type.  Plan to continue monitoring this for a few days in order to ensure safety.  Attempted to offer MAT to patient for alcohol use disorder but patient refused due to concerns of taking too many medications.  Would recommend continuing to discuss this as patient is an appropriate candidate for naltrexone.  Patient reports interested in therapy so we will discuss this with LCSW to refer patient for this and provide AA resources. Continuing home medications for now although unclear why he is on supratherapeutic dose of lamotrigine . Abnormal ECG so will repeat. Cariprazine  can be continued as it is not known to affect Qtc.   PLAN: Safety and Monitoring:  -- Voluntary admission to inpatient psychiatric unit for safety, stabilization and treatment  -- Daily contact with patient to assess and evaluate symptoms and progress in treatment  -- Patient's case to be discussed in multi-disciplinary team meeting  --  Observation Level : q15 minute checks  -- Vital signs: q12 hours  -- Precautions: suicide, elopement, and assault  2. Psychiatric Problems Alcohol use disorder Stimulant use disorder-cocaine Cannabis use disorder Nicotine  dependence Substance-induced mood disorder versus adjustment disorder with mixed depressed mood and anxiety History of major depressive disorder -Continue home vraylar  3 mg daily for mood  -reordered ECG as it showed prolonged Qtc and patient appears to have had history of pericarditis. No symptoms at this time but will closely monitor  -ordered lipid profile and A1c for metabolic labs -continue home lamotrigine  150 mg twice daily  for mood -Start melatonin 5 mg nightly - Trazodone  50 mg nightly as needed for insomnia - Hydroxyzine  25 mg 3 times daily as needed for anxiety -CIWA monitoring with as needed Ativan  - Nicotine  gum for NRT - Recommended naltrexone for MAT but patient refused - Referred to substance abuse intensive outpatient program -- As needed agitation protocol in-place  The risks/benefits/side-effects/alternatives to the above medication were discussed in detail with the patient and time was given for questions. The patient consents to medication trial. FDA black box warnings, if present, were discussed.  The patient is agreeable with the medication plan, as above. We will monitor the patient's response to pharmacologic treatment, and adjust medications as necessary.  3. Medical Problems - Protonix  40 mg for GERD  4. Routine and other pertinent labs: EKG monitoring: QTc: 489  Metabolism / endocrine: BMI: Body mass index is 19.61 kg/m. Prolactin: No results found for: "PROLACTIN" Lipid Panel: Lab Results  Component Value Date   CHOL 198 09/13/2021   TRIG 123 09/13/2021   HDL 63 09/13/2021   CHOLHDL 3.1 09/13/2021   VLDL 25 09/13/2021   LDLCALC 110 (H) 09/13/2021   LDLCALC 148 (H) 08/24/2019   HbgA1c: Hgb A1c MFr Bld (%)  Date Value   09/13/2021 5.0   TSH: TSH  Date Value  09/13/2021 3.090 uIU/mL  09/11/2016 0.84 mIU/L    5. Group Therapy:  -- Encouraged patient to participate in unit milieu and in scheduled group therapies   -- Short Term Goals: Ability to identify changes in lifestyle to reduce recurrence of condition, verbalize feelings, identify and develop effective coping behaviors, maintain clinical measurements within normal limits, and identify triggers associated with substance abuse/mental health issues will improve. Improvement in ability to demonstrate self-control and comply with prescribed medications.  -- Long Term Goals: Improvement in symptoms so as ready for discharge -- Patient is encouraged to participate in group therapy while admitted to the psychiatric unit. -- We will address other chronic and acute stressors, which contributed to the patient's Moderate alcohol use disorder (HCC) in order to reduce the risk of self-harm at discharge.  6. Discharge Planning:   -- Social work and case management to assist with discharge planning and identification of hospital follow-up needs prior to discharge  -- Estimated LOS: 5-7 days  -- Discharge Concerns: Need to establish a safety plan; Medication compliance and effectiveness  -- Discharge Goals: Return home with outpatient referrals for mental health follow-up including medication management/psychotherapy  I certify that inpatient services furnished can reasonably be expected to improve the patient's condition.   Signed: Augusta Blizzard, MD 08/04/2023, 12:34 PM

## 2023-08-04 NOTE — Progress Notes (Signed)
 EKG results placed on the patient's shadow chart   Normal sinus rhythm Normal ECG  QT/ Qtc-Baz   428/441 ms

## 2023-08-04 NOTE — Plan of Care (Signed)
   Problem: Education: Goal: Emotional status will improve Outcome: Progressing Goal: Mental status will improve Outcome: Progressing   Problem: Activity: Goal: Interest or engagement in activities will improve Outcome: Progressing
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
## 2023-08-04 NOTE — Group Note (Addendum)
 LCSW Group Therapy Note  Group Date: 08/04/2023 Start Time: 1030 End Time: 1200   Type of Therapy and Topic: Self Soothing Skills   Participation Level:  Active   Description of Group The focus of this group was to determine what unhealthy coping techniques typically are used by group members and what healthy coping techniques would be helpful in coping with various problems. Patients were guided in becoming aware of the differences between healthy and unhealthy coping techniques. Patients were asked to identify 2-3 healthy coping skills they would like to learn to use more effectively.  Therapeutic Goals Patients learned that coping is what human beings do all day long to deal with various situations in their lives Patients defined and discussed healthy vs unhealthy coping techniques Patients identified their preferred coping techniques and identified whether these were healthy or unhealthy Patients determined 2-3 healthy coping skills they would like to become more familiar with and use more often. Patients provided support and ideas to each other   Summary of Patient Progress:  During group, patient  expressed how he likes to clean and take a hot shower for his self soothing. Patient proved open to input from peers and feedback from CSW. Patient demonstrated insight into the subject matter, was respectful of peers, and participated throughout the entire session.   Therapeutic Modalities Cognitive Behavioral Therapy DBT  Mariano Shiver, LCSWA 08/04/2023  12:25 PM

## 2023-08-04 NOTE — Progress Notes (Signed)
 Pt would like to know discharge plan   08/04/23 2150  Psych Admission Type (Psych Patients Only)  Admission Status Voluntary  Psychosocial Assessment  Patient Complaints Insomnia  Eye Contact Fair  Facial Expression Animated  Affect Appropriate to circumstance  Speech Logical/coherent  Interaction Assertive  Motor Activity Other (Comment) (WDL)  Appearance/Hygiene In scrubs  Behavior Characteristics Appropriate to situation  Mood Pleasant  Thought Process  Coherency WDL  Content WDL  Delusions None reported or observed  Perception WDL  Hallucination None reported or observed  Judgment Poor  Confusion None  Danger to Self  Current suicidal ideation? Denies  Self-Injurious Behavior No self-injurious ideation or behavior indicators observed or expressed   Agreement Not to Harm Self Yes  Description of Agreement verbal  Danger to Others  Danger to Others None reported or observed

## 2023-08-04 NOTE — Group Note (Signed)
 Date:  08/04/2023 Time:  9:35 AM  Group Topic/Focus:  Goals Group:   The focus of this group is to help patients establish daily goals to achieve during treatment and discuss how the patient can incorporate goal setting into their daily lives to aide in recovery. Orientation:   The focus of this group is to educate the patient on the purpose and policies of crisis stabilization and provide a format to answer questions about their admission.  The group details unit policies and expectations of patients while admitted.    Participation Level:  Active  Participation Quality:  Appropriate  Affect:  Appropriate  Cognitive:  Appropriate  Insight: Appropriate  Engagement in Group:  Developing/Improving  Modes of Intervention:  Discussion and Orientation  Additional Comments:    Zeki Bedrosian D Krystyl Cannell 08/04/2023, 9:35 AM

## 2023-08-05 ENCOUNTER — Encounter (HOSPITAL_COMMUNITY): Payer: Self-pay

## 2023-08-05 LAB — LIPID PANEL
Cholesterol: 142 mg/dL (ref 0–200)
HDL: 51 mg/dL (ref >40)
LDL Cholesterol: 77 mg/dL (ref 0–99)
Total CHOL/HDL Ratio: 2.8 ratio
Triglycerides: 70 mg/dL (ref <150)
VLDL: 14 mg/dL (ref 0–40)

## 2023-08-05 NOTE — Group Note (Signed)
 Recreation Therapy Group Note   Group Topic:Stress Management  Group Date: 08/05/2023 Start Time: 0932 End Time: 0958 Facilitators: Lonney Revak-McCall, LRT,CTRS Location: [address] Hall Dayroom   Group Topic: Stress Management  Goal Area(s) Addresses:  Patient will identify positive stress management techniques. Patient will identify benefits of using stress management post d/c.  Behavioral Response:   Intervention: Calm App  Activity: Meditation. LRT played a meditation for patients called the [facility] meditation. This meditation focused on taking the strength and resilience that mountains represent and bringing them into your daily life.    Education:  Stress Management, Discharge Planning.   Education Outcome: Acknowledges Education   Affect/Mood: N/A   Participation Level: Did not attend    Clinical Observations/Individualized Feedback:     Plan: Continue to engage patient in RT group sessions 2-3x/week.   Mykelti Goldenstein-McCall, LRT,CTRS 08/05/2023 12:33 PM

## 2023-08-05 NOTE — Plan of Care (Signed)
   Problem: Education: Goal: Knowledge of [facility] Education information/materials will improve Outcome: Progressing Goal: Emotional status will improve Outcome: Progressing Goal: Mental status will improve Outcome: Progressing Goal: Verbalization of understanding the information provided will improve Outcome: Progressing

## 2023-08-05 NOTE — Progress Notes (Signed)
   08/05/23 2300  Psych Admission Type (Psych Patients Only)  Admission Status Voluntary  Psychosocial Assessment  Patient Complaints Anxiety  Eye Contact Fair  Facial Expression Animated  Affect Appropriate to circumstance  Speech Logical/coherent  Interaction Assertive  Motor Activity Other (Comment) (WNL)  Appearance/Hygiene Unremarkable  Behavior Characteristics Cooperative;Calm;Appropriate to situation  Mood Euphoric  Thought Process  Coherency WDL  Content WDL  Delusions None reported or observed  Perception WDL  Hallucination None reported or observed  Judgment Poor  Confusion None  Danger to Self  Current suicidal ideation? Denies  Description of Suicide Plan None  Self-Injurious Behavior No self-injurious ideation or behavior indicators observed or expressed   Agreement Not to Harm Self Yes  Description of Agreement Verbal contract for safety  Danger to Others  Danger to Others None reported or observed

## 2023-08-05 NOTE — Plan of Care (Signed)
   Problem: Education: Goal: Knowledge of [facility] Education information/materials will improve Outcome: Progressing Goal: Emotional status will improve Outcome: Progressing   Problem: Coping: Goal: Ability to verbalize frustrations and anger appropriately will improve Outcome: Progressing

## 2023-08-05 NOTE — Progress Notes (Signed)
   08/05/23 0530  15 Minute Checks  Location Hallway  Visual Appearance Calm  Behavior Composed  Sleep (Behavioral Health Patients Only)  Calculate sleep? (Click Yes once per 24 hr at 0600 safety check) Yes  Documented sleep last 24 hours 3.75

## 2023-08-05 NOTE — Progress Notes (Signed)
 [facility] MD Progress Note  08/05/2023 12:10 PM Chris Olson.  MRN:  [id number] Subjective:   Chris Olson. is a 33 yr old male who presented on 6/6 to [facility] Via GPD with worsening depression and SI w/ a Plan- OD on Cocaine, he was admitted to [facility] on 6/8.  PPHx is significant for MDD, Schizoaffective Disorder, Bipolar Type, PTSD,  and Polysubstance Abuse (EtOH, Cocaine), Suicide Attempt (OD), and 4 Prior Psychiatric Hospitalizations [facility] 07/2019).   Case was discussed in the multidisciplinary team. MAR was reviewed and patient was compliant with medications.  He received PRN Hydroxyzine  and Milk of Magnesia yesterday.   Psychiatric Team made the following recommendations yesterday: -Continue Vraylar  3 mg daily for depression and mood stability -Continue Lamictal  150 mg BID for mood stability -Start Melatonin 5 mg QHS   On interview today patient reports he slept good last night.  He reports his appetite is doing good.  He reports no SI, HI, or AVH.  He reports no Paranoia or Ideas of Reference.  He reports no issues with his medications.  He reports feeling much better because he has been able to talk with his girlfriend and they are not breaking up.  He reports that he does not have family here only his girlfriend and her family so this would have left him with nothing.  Discussed we would reach out to his girlfriend and he was agreeable with this.  He reports no other concerns at present.   Called [place], [phone].  She reports that she has been talking with the patient and that he is sounding better than he had been.   She reports that when he uses alcohol to excess this is when he will have problems.  Discussed with her that we would offer substance resources and she was thankful.  Discussed that we were considering discharge in the next few days and she was agreeable with this.  She reports no other concerns at present.   Principal Problem: Moderate alcohol use disorder  (HCC) Diagnosis: Principal Problem:   Moderate alcohol use disorder (HCC) Active Problems:   Cocaine use disorder, moderate, in controlled environment (HCC)   Moderate cannabis use disorder (HCC)   Adjustment disorder with mixed anxiety and depressed mood  Total Time spent with patient:  I personally spent 35 minutes on the unit in direct patient care. The direct patient care time included face-to-face time with the patient, reviewing the patient's chart, communicating with other professionals, and coordinating care. Greater than 50% of this time was spent in counseling or coordinating care with the patient regarding goals of hospitalization, psycho-education, and discharge planning needs.   Past Psychiatric History:  MDD, Schizoaffective Disorder, Bipolar Type, PTSD,  and Polysubstance Abuse (EtOH, Cocaine), Suicide Attempt (OD), and 4 Prior Psychiatric Hospitalizations [facility] 07/2019).  Past Medical History:  Past Medical History:  Diagnosis Date   Anxiety    Chest pain    Cocaine abuse with cocaine-induced mood disorder (HCC) 11/11/2017   Depression    Pericarditis    Schizophrenia (HCC)    History reviewed. No pertinent surgical history. Family History: History reviewed. No pertinent family history. Family Psychiatric  History:  Reports None  Social History:  Social History   Substance and Sexual Activity  Alcohol Use Yes   Alcohol/week: 15.0 standard drinks of alcohol   Types: 15 Cans of beer per week   Comment: (2) 40 oz beers per day most days     Social History  Substance and Sexual Activity  Drug Use Yes   Types: Marijuana, Cocaine    Social History   Socioeconomic History   Marital status: Divorced    Spouse name: Not on file   Number of children: Not on file   Years of education: Not on file   Highest education level: Not on file  Occupational History   Not on file  Tobacco Use   Smoking status: Every Day    Current packs/day: 0.75    Types:  Cigarettes   Smokeless tobacco: Never  Vaping Use   Vaping status: Never Used  Substance and Sexual Activity   Alcohol use: Yes    Alcohol/week: 15.0 standard drinks of alcohol    Types: 15 Cans of beer per week    Comment: (2) 40 oz beers per day most days   Drug use: Yes    Types: Marijuana, Cocaine   Sexual activity: Not Currently  Other Topics Concern   Not on file  Social History Narrative   Not on file   Social Drivers of Health   Financial Resource Strain: Medium Risk (09/10/2019)   Overall Financial Resource Strain (CARDIA)    Difficulty of Paying Living Expenses: Somewhat hard  Food Insecurity: Food Insecurity Present (08/03/2023)   Hunger Vital Sign    Worried About Running Out of Food in the Last Year: Sometimes true    Ran Out of Food in the Last Year: Sometimes true  Transportation Needs: No Transportation Needs (08/03/2023)   PRAPARE - Administrator, Civil Service (Medical): No    Lack of Transportation (Non-Medical): No  Physical Activity: Inactive (09/10/2019)   Exercise Vital Sign    Days of Exercise per Week: 0 days    Minutes of Exercise per Session: 0 min  Stress: Stress Concern Present (09/10/2019)   [employer] of Occupational Health - Occupational Stress Questionnaire    Feeling of Stress : Very much  Social Connections: Socially Isolated (09/10/2019)   Social Connection and Isolation Panel [NHANES]    Frequency of Communication with Friends and Family: Never    Frequency of Social Gatherings with Friends and Family: Never    Attends Religious Services: Never    Database administrator or Organizations: No    Attends Engineer, structural: Never    Marital Status: Separated   Additional Social History:                         Sleep: Good  Appetite:  Good  Current Medications: Current Facility-Administered Medications  Medication Dose Route Frequency Provider Last Rate Last Admin   acetaminophen  (TYLENOL ) tablet  650 mg  650 mg Oral Q6H PRN Gilman Lade, NP       alum & mag hydroxide-simeth (MAALOX/MYLANTA) 200-200-20 MG/5ML suspension 30 mL  30 mL Oral Q4H PRN Lorrene Rosser, Veronique M, NP       cariprazine  (VRAYLAR ) capsule 3 mg  3 mg Oral Daily Byungura, Veronique M, NP   3 mg at 08/05/23 6578   haloperidol  (HALDOL ) tablet 5 mg  5 mg Oral TID PRN Gilman Lade, NP       And   diphenhydrAMINE  (BENADRYL ) capsule 50 mg  50 mg Oral TID PRN Gilman Lade, NP       haloperidol  lactate (HALDOL ) injection 5 mg  5 mg Intramuscular TID PRN Gilman Lade, NP       And   diphenhydrAMINE  (BENADRYL ) injection 50  mg  50 mg Intramuscular TID PRN Gilman Lade, NP       And   LORazepam  (ATIVAN ) injection 2 mg  2 mg Intramuscular TID PRN Byungura, Veronique M, NP       hydrOXYzine  (ATARAX ) tablet 25 mg  25 mg Oral TID PRN Gilman Lade, NP   25 mg at 08/04/23 2104   lamoTRIgine  (LAMICTAL ) tablet 150 mg  150 mg Oral BID Gilman Lade, NP   150 mg at 08/05/23 0827   LORazepam  (ATIVAN ) tablet 1 mg  1 mg Oral BID PRN Augusta Blizzard, MD       magnesium  hydroxide (MILK OF MAGNESIA) suspension 30 mL  30 mL Oral Daily PRN Byungura, Veronique M, NP   30 mL at 08/04/23 0750   melatonin tablet 5 mg  5 mg Oral QHS Ji, Andrew, MD   5 mg at 08/04/23 2104   nicotine  polacrilex (NICORETTE ) gum 2 mg  2 mg Oral PRN Zouev, Dmitri, MD   2 mg at 08/05/23 0830   pantoprazole  (PROTONIX ) EC tablet 40 mg  40 mg Oral Daily Byungura, Veronique M, NP   40 mg at 08/05/23 0827   polyethylene glycol (MIRALAX / GLYCOLAX) packet 17 g  17 g Oral Daily Augusta Blizzard, MD   17 g at 08/05/23 0827   senna-docusate (Senokot-S) tablet 1 tablet  1 tablet Oral Daily Augusta Blizzard, MD   1 tablet at 08/04/23 1704   traZODone  (DESYREL ) tablet 50 mg  50 mg Oral QHS PRN Gilman Lade, NP        Lab Results:  Results for orders placed or performed during the hospital encounter of 08/03/23 (from the past 48 hours)   Lipid panel     Status: None   Collection Time: 08/05/23  6:26 AM  Result Value Ref Range   Cholesterol 142 0 - 200 mg/dL   Triglycerides 70 <161 mg/dL   HDL 51 >09 mg/dL   Total CHOL/HDL Ratio 2.8 RATIO   VLDL 14 0 - 40 mg/dL   LDL Cholesterol 77 0 - 99 mg/dL    Comment:        Total Cholesterol/HDL:CHD Risk Coronary Heart Disease Risk Table                     Men   Women  1/2 Average Risk   3.4   3.3  Average Risk       5.0   4.4  2 X Average Risk   9.6   7.1  3 X Average Risk  23.4   11.0        Use the calculated Patient Ratio above and the CHD Risk Table to determine the patient's CHD Risk.        ATP III CLASSIFICATION (LDL):  <100     mg/dL   Optimal  604-540  mg/dL   Near or Above                    Optimal  130-159  mg/dL   Borderline  981-191  mg/dL   High  >478     mg/dL   Very High Performed at [facility], [address], [place], [place] [address]     Blood Alcohol level:  Lab Results  Component Value Date   [facility] <15 08/02/2023   ETH 86 (H) 09/13/2021    Metabolic Disorder Labs: Lab Results  Component Value Date   HGBA1C 5.0 09/13/2021  MPG 96.8 09/13/2021   MPG 96.8 08/24/2019   No results found for: PROLACTIN Lab Results  Component Value Date   CHOL 142 08/05/2023   TRIG 70 08/05/2023   HDL 51 08/05/2023   CHOLHDL 2.8 08/05/2023   VLDL 14 08/05/2023   LDLCALC 77 08/05/2023   LDLCALC 110 (H) 09/13/2021    Physical Findings: AIMS:  , ,  ,  ,    CIWA:  CIWA-Ar Total: 1 COWS:     Musculoskeletal: Strength & Muscle Tone: within normal limits Gait & Station: normal Patient leans: N/A  Psychiatric Specialty Exam:  Presentation  General Appearance:  Casual  Eye Contact: Fair  Speech: Normal Rate  Speech Volume: Normal  Handedness: Right   Mood and Affect  Mood: Euthymic  Affect: Congruent   Thought Process  Thought Processes: Goal Directed  Descriptions of  Associations:Intact  Orientation:Full (Time, Place and Person)  Thought Content:Rumination (about discharge)  History of Schizophrenia/Schizoaffective disorder:Yes  Duration of Psychotic Symptoms:No data recorded Hallucinations:Hallucinations: None  Ideas of Reference:None  Suicidal Thoughts:Suicidal Thoughts: No  Homicidal Thoughts:Homicidal Thoughts: No   Sensorium  Memory: Immediate Fair  Judgment: Fair  Insight: Fair   Art therapist  Concentration: Fair  Attention Span: Fair  Recall: [employer] of Knowledge: Fair  Language: Fair   Psychomotor Activity  Psychomotor Activity: Psychomotor Activity: Normal   Assets  Assets: Communication Skills; Desire for Improvement; Physical Health; Resilience   Sleep  Sleep: Sleep: Good    Physical Exam: Physical Exam Vitals and nursing note reviewed.  Constitutional:      General: He is not in acute distress.    Appearance: Normal appearance. He is normal weight. He is not ill-appearing or toxic-appearing.  HENT:     Head: Normocephalic and atraumatic.  Pulmonary:     Effort: Pulmonary effort is normal.  Musculoskeletal:        General: Normal range of motion.  Neurological:     General: No focal deficit present.     Mental Status: He is alert.   Review of Systems  Respiratory:  Negative for cough and shortness of breath.   Cardiovascular:  Negative for chest pain.  Gastrointestinal:  Negative for abdominal pain, constipation, diarrhea, nausea and vomiting.  Neurological:  Negative for dizziness, weakness and headaches.  Psychiatric/Behavioral:  Negative for depression, hallucinations and suicidal ideas. The patient is not nervous/anxious.    Blood pressure 120/85, pulse 76, temperature 98.9 F (37.2 C), temperature source Oral, resp. rate 16, height 5' 8 (1.727 m), weight 58.5 kg, SpO2 100%. Body mass index is 19.61 kg/m.   Treatment Plan Summary: Daily contact with patient to  assess and evaluate symptoms and progress in treatment and Medication management  Chris Olson. is a 33 yr old male who presented on 6/6 to [facility] Via GPD with worsening depression and SI w/ a Plan- OD on Cocaine, he was admitted to [facility] on 6/8.  PPHx is significant for MDD, Schizoaffective Disorder, Bipolar Type, PTSD,  and Polysubstance Abuse (EtOH, Cocaine), Suicide Attempt (OD), and 4 Prior Psychiatric Hospitalizations [facility] 07/2019).   [place] has been improving with a combination of both being on his medication regularly and with his detox from substances.  We will not make any changes to his medications at this time.  We will continue to monitor.    MDD, Recurrent, Severe  PTSD: -Continue Vraylar  3 mg daily for depression and mood stability -Continue Lamictal  150 mg BID for mood stability -Continue Agitation Protocol: Haldol /Ativan /Benadryl   Withdrawal: -Continue CIWA, last score= 1  @ 1859  6/8 -Continue Ativan  1 mg q6 PRN CIWA>10   -Continue Melatonin 5 mg QHS -Continue Protonix  EC 40 mg daily -Continue Miralax 17 g daily -Continue Senokot-S 1 tablet daily -Continue PRN's: Tylenol , Maalox, Atarax , Milk of Magnesia, Trazodone     --  The risks/benefits/side-effects/alternatives to medications were discussed in detail with the patient and time was given for questions. The patient consents to medication trials.                -- Metabolic profile and EKG monitoring obtained while on an atypical antipsychotic (CMP: WNL except Tot Prot: 6.3, CBC: WNL except Neut Abs: 8.4,  Lipid Panel: WNL,  EKG: NSR w/ Sinus Arrhythmia w/ Qtc: 482, UDS: Cocaine and THC pos, A1c: pending)              -- Encouraged patient to participate in unit milieu and in scheduled group therapies              -- Short Term Goals: Ability to identify changes in lifestyle to reduce recurrence of condition will improve, Ability to verbalize feelings will improve, Ability to disclose and discuss suicidal ideas,  Ability to demonstrate self-control will improve, Ability to identify and develop effective coping behaviors will improve, Ability to maintain clinical measurements within normal limits will improve, Compliance with prescribed medications will improve, and Ability to identify triggers associated with substance abuse/mental health issues will improve             -- Long Term Goals: Improvement in symptoms so as ready for discharge   Safety and Monitoring:             -- Voluntary admission to inpatient psychiatric unit for safety, stabilization and treatment             -- Daily contact with patient to assess and evaluate symptoms and progress in treatment             -- Patient's case to be discussed in multi-disciplinary team meeting             -- Observation Level : q15 minute checks             -- Vital signs:  q12 hours             -- Precautions: suicide, elopement, and assault  Discharge Planning:              -- Social work and case management to assist with discharge planning and identification of hospital follow-up needs prior to discharge             -- Estimated LOS: 1-3 more days             -- Discharge Concerns: Need to establish a safety plan; Medication compliance and effectiveness             -- Discharge Goals: Return home with outpatient referrals for mental health follow-up including medication management/psychotherapy   Basilia Bosworth, DO 08/05/2023, 12:10 PM

## 2023-08-05 NOTE — [facility] Group Notes (Addendum)
Pt did attend AA group  

## 2023-08-05 NOTE — Group Note (Signed)
 Date:  08/05/2023 Time:  9:00 AM  Group Topic/Focus:  Goals Group:   The focus of this group is to help patients establish daily goals to achieve during treatment and discuss how the patient can incorporate goal setting into their daily lives to aide in recovery.    Participation Level:  Active  Participation Quality:  Appropriate  Affect:  Appropriate  Ellan Gunner 08/05/2023, 9:00 AM

## 2023-08-06 LAB — HEMOGLOBIN A1C
Hgb A1c MFr Bld: 5 % (ref 4.8–5.6)
Mean Plasma Glucose: 97 mg/dL

## 2023-08-06 MED ORDER — POLYETHYLENE GLYCOL 3350 17 G PO PACK: 17.0000 g | PACK | Freq: Every day | ORAL | 0 refills | Status: DC

## 2023-08-06 MED ORDER — NICOTINE POLACRILEX 2 MG MT GUM: 2.0000 mg | CHEWING_GUM | OROMUCOSAL | 0 refills | Status: DC | PRN

## 2023-08-06 MED ORDER — MELATONIN 5 MG PO TABS: 5.0000 mg | ORAL_TABLET | Freq: Every day | ORAL | 0 refills | Status: DC

## 2023-08-06 NOTE — Progress Notes (Signed)
 Pt has full range, bright affect and euthymic mood.  Pt is forward thinking to discharge and intends to resume AA upon discharge.  Pt denied SI/HI/AVH, pain, and distress.   08/06/23 1000  Psych Admission Type (Psych Patients Only)  Admission Status Voluntary  Psychosocial Assessment  Patient Complaints None  Eye Contact Fair  Facial Expression Animated  Affect Appropriate to circumstance  Speech Logical/coherent  Interaction Assertive  Appearance/Hygiene Unremarkable  Behavior Characteristics Cooperative  Mood Euthymic;Pleasant  Thought Process  Coherency WDL  Content WDL  Delusions None reported or observed  Perception WDL  Hallucination None reported or observed  Judgment Impaired  Confusion None  Danger to Self  Current suicidal ideation? Denies  Self-Injurious Behavior No self-injurious ideation or behavior indicators observed or expressed   Agreement Not to Harm Self Yes  Description of Agreement Verbal  Danger to Others  Danger to Others None reported or observed

## 2023-08-06 NOTE — Group Note (Signed)
 Date:  08/06/2023 Time:  8:49 AM  Group Topic/Focus:  Goals Group:   The focus of this group is to help patients establish daily goals to achieve during treatment and discuss how the patient can incorporate goal setting into their daily lives to aide in recovery. Orientation:   The focus of this group is to educate the patient on the purpose and policies of crisis stabilization and provide a format to answer questions about their admission.  The group details unit policies and expectations of patients while admitted.    Participation Level:  Active  Participation Quality:  Appropriate  Affect:  Appropriate  Cognitive:  Appropriate  Insight: Appropriate  Engagement in Group:  Engaged  Modes of Intervention:  Orientation  Additional Comments:  Goal is to discharge  Chris Olson 08/06/2023, 8:49 AM

## 2023-08-06 NOTE — Progress Notes (Signed)
 [facility] Adult Case Management Discharge Plan :  Will you be returning to the same living situation after discharge:  Yes,  home At discharge, do you have transportation home?: Yes,  GF to provide Do you have the ability to pay for your medications: Yes,  active Medicaid  Release of information consent forms completed and in the chart;  Patient's signature needed at discharge.  Patient to Follow up at:  Follow-up Information     [facility] Follow up on 08/14/2023.   Why: You have a hospital follow up appointment for therapy and medication management services on  08/14/23 at 1:45 pm.  The appointment will be Virtual telehealth. Contact information: [address]  [address] [place] [address] [phone]                 Next level of care provider has access to [facility] Link:no  Safety Planning and Suicide Prevention discussed: Yes,  completed with patient, due to failed attempts with GF  Has patient been referred to the Quitline?: Patient refused referral for treatment  Patient has been referred for addiction treatment: Yes, the patient will follow up with an outpatient provider for substance use disorder. Therapist: appointment made with Javan Messing, LCSW 08/06/2023, 11:26 AM

## 2023-08-06 NOTE — Group Note (Signed)
 Recreation Therapy Group Note   Group Topic:Animal Assisted Therapy   Group Date: 08/06/2023 Start Time: 0946 End Time: 1030 Facilitators: Rhian Funari-McCall, LRT,CTRS Location: [address] Dayroom   Animal-Assisted Activity (AAA) Program Checklist/Progress Notes Patient Eligibility Criteria Checklist & Daily Group note for Rec Tx Intervention  AAA/T Program Assumption of Risk Form signed by Patient/ or Parent Legal Guardian Yes  Patient understands his/her participation is voluntary Yes  Behavioral Response:    Education: Charity fundraiser, Appropriate Animal Interaction   Education Outcome: Acknowledges education.    Affect/Mood: N/A   Participation Level: Did not attend    Clinical Observations/Individualized Feedback:    Plan: Continue to engage patient in RT group sessions 2-3x/week.   Mikelle Myrick-McCall, LRT,CTRS 08/06/2023 12:16 PM

## 2023-08-06 NOTE — Progress Notes (Signed)
 Patient ID: Chris Olson., male   DOB: [birth year], 33 y.o.   MRN: [id number] Nurs Dischg Note:   D:Patient denies SI/HI/AVH at this time. Pt appears calm and cooperative, and no distress noted. Pt expressed gratitude for care on unit.   A: All Personal items in locker returned to pt. Pt given AVS/ SRA / BH Transition/ safety plan and prescriptions.  Pt escorted to the lobby and girlfriend was present for his ride.   R:  Pt States she will comply with outpatient / other services, and take MEDS as prescribed.

## 2023-08-06 NOTE — [facility] Suicide Risk Assessment (Signed)
 [facility] INPATIENT:  Family/Significant Other Suicide Prevention Education  Suicide Prevention Education:   Contact Attempts: Loyal Ruffing - significant other [phone]), has been identified by the patient as the family member/significant other with whom the patient will be residing, and identified as the person(s) who will aid the patient in the event of a mental health crisis.  With written consent from the patient, two attempts were made to provide suicide prevention education, prior to and/or following the patient's discharge.  We were unsuccessful in providing suicide prevention education.  A suicide education pamphlet was given to the patient to share with family/significant other. Date and time of first attempt:08/06/23 / 1100 Date and time of second attempt:08/06/23 /1119   Suicide Prevention Education was reviewed thoroughly with patient, including risk factors, warning signs, and what to do. Mobile Crisis services were described and that telephone number pointed out, with encouragement to patient to put this number in personal cell phone. Brochure was provided to patient to share with natural supports. Patient acknowledged the ways in which they are at risk, and how working through each of their issues can gradually start to reduce their risk factors. Patient was encouraged to think of the information in the context of people in their own lives. Patient denied having access to firearms Patient verbalized understanding of information provided. Patient endorsed a desire to live.    The suicide prevention education provided includes the following: Suicide risk factors Suicide prevention and interventions National Suicide Hotline telephone number [facility] assessment telephone number [facility] Emergency Assistance 911 [facility] and/or Residential Mobile Crisis Unit telephone number   Richardson Chang [place], [place] 08/06/2023, 11:22 AM

## 2023-08-06 NOTE — BH IP Treatment Plan (Signed)
 Interdisciplinary Treatment and Diagnostic Plan Update  08/06/2023 Time of Session: 1118 Chris Tercero Jr. MRN: [id number]  Principal Diagnosis: Moderate alcohol use disorder (HCC)  Secondary Diagnoses: Principal Problem:   Moderate alcohol use disorder (HCC) Active Problems:   Cocaine use disorder, moderate, in controlled environment (HCC)   Moderate cannabis use disorder (HCC)   Adjustment disorder with mixed anxiety and depressed mood   Current Medications:  Current Facility-Administered Medications  Medication Dose Route Frequency Provider Last Rate Last Admin   acetaminophen  (TYLENOL ) tablet 650 mg  650 mg Oral Q6H PRN Gilman Lade, NP       alum & mag hydroxide-simeth (MAALOX/MYLANTA) 200-200-20 MG/5ML suspension 30 mL  30 mL Oral Q4H PRN Lorrene Rosser, Veronique M, NP       cariprazine  (VRAYLAR ) capsule 3 mg  3 mg Oral Daily Byungura, Veronique M, NP   3 mg at 08/06/23 4098   haloperidol  (HALDOL ) tablet 5 mg  5 mg Oral TID PRN Gilman Lade, NP       And   diphenhydrAMINE  (BENADRYL ) capsule 50 mg  50 mg Oral TID PRN Gilman Lade, NP       haloperidol  lactate (HALDOL ) injection 5 mg  5 mg Intramuscular TID PRN Gilman Lade, NP       And   diphenhydrAMINE  (BENADRYL ) injection 50 mg  50 mg Intramuscular TID PRN Gilman Lade, NP       And   LORazepam  (ATIVAN ) injection 2 mg  2 mg Intramuscular TID PRN Byungura, Veronique M, NP       hydrOXYzine  (ATARAX ) tablet 25 mg  25 mg Oral TID PRN Gilman Lade, NP   25 mg at 08/04/23 2104   lamoTRIgine  (LAMICTAL ) tablet 150 mg  150 mg Oral BID Elston Halsted M, NP   150 mg at 08/06/23 1191   LORazepam  (ATIVAN ) tablet 1 mg  1 mg Oral BID PRN Augusta Blizzard, MD       magnesium  hydroxide (MILK OF MAGNESIA) suspension 30 mL  30 mL Oral Daily PRN Gilman Lade, NP   30 mL at 08/04/23 0750   melatonin tablet 5 mg  5 mg Oral QHS Ji, Andrew, MD   5 mg at 08/05/23 2119   nicotine  polacrilex  (NICORETTE ) gum 2 mg  2 mg Oral PRN Zouev, Dmitri, MD   2 mg at 08/06/23 0752   pantoprazole  (PROTONIX ) EC tablet 40 mg  40 mg Oral Daily Byungura, Veronique M, NP   40 mg at 08/06/23 0751   polyethylene glycol (MIRALAX / GLYCOLAX) packet 17 g  17 g Oral Daily Augusta Blizzard, MD   17 g at 08/05/23 0827   senna-docusate (Senokot-S) tablet 1 tablet  1 tablet Oral Daily Augusta Blizzard, MD   1 tablet at 08/04/23 1704   traZODone  (DESYREL ) tablet 50 mg  50 mg Oral QHS PRN Byungura, Veronique M, NP   50 mg at 08/05/23 2119   PTA Medications: Medications Prior to Admission  Medication Sig Dispense Refill Last Dose/Taking   cariprazine  (VRAYLAR ) 3 MG capsule Take 3 mg by mouth daily.      cariprazine  (VRAYLAR ) 3 MG capsule Take 3 mg by mouth daily.      hydrOXYzine  (ATARAX /VISTARIL ) 25 MG tablet Take 1 tablet (25 mg total) by mouth every 6 (six) hours as needed for anxiety. 75 tablet 0    lamoTRIgine  (LAMICTAL ) 150 MG tablet Take 150 mg by mouth 2 (two) times daily.      omeprazole  (PRILOSEC)  20 MG capsule Take 1 capsule (20 mg total) by mouth daily. 30 capsule 0     Patient Stressors: Marital or family conflict   Substance abuse    Patient Strengths: Average or above average intelligence  Communication skills  Motivation for treatment/growth  Physical Health   Treatment Modalities: Medication Management, Group therapy, Case management,  1 to 1 session with clinician, Psychoeducation, Recreational therapy.   Physician Treatment Plan for Primary Diagnosis: Moderate alcohol use disorder (HCC) Long Term Goal(s):     Short Term Goals:    Medication Management: Evaluate patient's response, side effects, and tolerance of medication regimen.  Therapeutic Interventions: 1 to 1 sessions, Unit Group sessions and Medication administration.  Evaluation of Outcomes: Progressing  Physician Treatment Plan for Secondary Diagnosis: Principal Problem:   Moderate alcohol use disorder (HCC) Active Problems:    Cocaine use disorder, moderate, in controlled environment (HCC)   Moderate cannabis use disorder (HCC)   Adjustment disorder with mixed anxiety and depressed mood  Long Term Goal(s):     Short Term Goals:       Medication Management: Evaluate patient's response, side effects, and tolerance of medication regimen.  Therapeutic Interventions: 1 to 1 sessions, Unit Group sessions and Medication administration.  Evaluation of Outcomes: Progressing   RN Treatment Plan for Primary Diagnosis: Moderate alcohol use disorder (HCC) Long Term Goal(s): Knowledge of disease and therapeutic regimen to maintain health will improve  Short Term Goals: Ability to remain free from injury will improve, Ability to verbalize frustration and anger appropriately will improve, Ability to demonstrate self-control, Ability to participate in decision making will improve, Ability to verbalize feelings will improve, Ability to disclose and discuss suicidal ideas, Ability to identify and develop effective coping behaviors will improve, and Compliance with prescribed medications will improve  Medication Management: RN will administer medications as ordered by provider, will assess and evaluate patient's response and provide education to patient for prescribed medication. RN will report any adverse and/or side effects to prescribing provider.  Therapeutic Interventions: 1 on 1 counseling sessions, Psychoeducation, Medication administration, Evaluate responses to treatment, Monitor vital signs and CBGs as ordered, Perform/monitor CIWA, COWS, AIMS and Fall Risk screenings as ordered, Perform wound care treatments as ordered.  Evaluation of Outcomes: Progressing   LCSW Treatment Plan for Primary Diagnosis: Moderate alcohol use disorder (HCC) Long Term Goal(s): Safe transition to appropriate next level of care at discharge, Engage patient in therapeutic group addressing interpersonal concerns.  Short Term Goals: Engage  patient in aftercare planning with referrals and resources, Increase social support, Increase ability to appropriately verbalize feelings, Increase emotional regulation, Facilitate acceptance of mental health diagnosis and concerns, Facilitate patient progression through stages of change regarding substance use diagnoses and concerns, Identify triggers associated with mental health/substance abuse issues, and Increase skills for wellness and recovery  Therapeutic Interventions: Assess for all discharge needs, 1 to 1 time with Social worker, Explore available resources and support systems, Assess for adequacy in community support network, Educate family and significant other(s) on suicide prevention, Complete Psychosocial Assessment, Interpersonal group therapy.  Evaluation of Outcomes: Progressing   Progress in Treatment: Attending groups: Yes. Participating in groups: Yes. Taking medication as prescribed: Yes. Toleration medication: Yes. Family/Significant other contact made: No, will contact:  Delene Feinstein (Girlfriend) [phone] Patient understands diagnosis: Yes. Discussing patient identified problems/goals with staff: Yes. Medical problems stabilized or resolved: Yes. Denies suicidal/homicidal ideation: Yes. Issues/concerns per patient self-inventory: No. Other: None  New problem(s) identified: No, Describe:  n/a  New  Short Term/Long Term Goal(s): medication stabilization, elimination of SI thoughts, development of comprehensive mental wellness plan.   Patient Goals: "Continue to do well and believe in myself"  Discharge Plan or Barriers: Patient recently admitted. CSW will continue to follow and assess for appropriate referrals and possible discharge planning.    Reason for Continuation of Hospitalization: Medication stabilization Other; describe : mood stabilization, discharge planning  Estimated Length of Stay: 1-3 DAYS  Last 3 [place] Suicide Severity Risk  Score: Flowsheet Row Ad[facility] (Current) [facility]02/2023 in [facility] from 06/21/2023 in [facility])  C-SSRS RISK CATEGORY High Risk High Risk No Risk       Last PHQ 2/9 Scores:    09/13/2021    3:42 AM 09/10/2019   11:16 AM 09/05/2019    7:43 PM  Depression screen PHQ 2/9  Decreased Interest 3 3 3   Down, Depressed, Hopeless 3 3 3   PHQ - 2 Score 6 6 6   Altered sleeping 2 3 3   Tired, decreased energy 2 3 3   Change in appetite 2 3 3   Feeling bad or failure about yourself  3 3 3   Trouble concentrating 3 3 3   Moving slowly or fidgety/restless 3 2 3   Suicidal thoughts 1 3 3   PHQ-9 Score 22 26 27   Difficult doing work/chores Very difficult Extremely dIfficult     Scribe for Treatment Team: Vernestine Gondola, LCSW 08/06/2023 8:52 AM

## 2023-08-06 NOTE — [facility] Suicide Risk Assessment (Signed)
 Suicide Risk Assessment  Discharge Assessment    [facility] Discharge Suicide Risk Assessment   Principal Problem: Moderate alcohol use disorder [facility]) Discharge Diagnoses: Principal Problem:   Moderate alcohol use disorder (HCC) Active Problems:   Cocaine use disorder, moderate, in controlled environment (HCC)   Moderate cannabis use disorder (HCC)   Adjustment disorder with mixed anxiety and depressed mood  During the patient's hospitalization, patient had extensive initial psychiatric evaluation, and follow-up psychiatric evaluations every day.  Psychiatric diagnoses provided upon initial assessment:  Cocaine use disorder, moderate, in controlled environment (HCC)   Moderate cannabis use disorder (HCC)   Adjustment disorder with mixed anxiety and depressed mood  Patient's psychiatric medications were adjusted on admission: Continued home Vraylar  and Lamictal   During the hospitalization, other adjustments were made to the patient's psychiatric medication regimen: None  Gradually, patient started adjusting to milieu.   Patient's care was discussed during the interdisciplinary team meeting every day during the hospitalization.  The patient is not having side effects to prescribed psychiatric medication.  The patient reports their target psychiatric symptoms of depression and SI responded well to the psychiatric medications, and the patient reports overall benefit other psychiatric hospitalization. Supportive psychotherapy was provided to the patient. The patient also participated in regular group therapy while admitted.   Labs were reviewed with the patient, and abnormal results were discussed with the patient.  The patient denied having suicidal thoughts more than 48 hours prior to discharge.  Patient denies having homicidal thoughts.  Patient denies having auditory hallucinations.  Patient denies any visual hallucinations.  Patient denies having paranoid thoughts.  The patient is able to  verbalize their individual safety plan to this provider.  It is recommended to the patient to continue psychiatric medications as prescribed, after discharge from the hospital.    It is recommended to the patient to follow up with your outpatient psychiatric provider and PCP.  Discussed with the patient, the impact of alcohol, drugs, tobacco have been there overall psychiatric and medical wellbeing, and total abstinence from substance use was recommended the patient.  Total Time spent with patient: 20 minutes  Musculoskeletal: Strength & Muscle Tone: within normal limits Gait & Station: normal Patient leans: N/A  Psychiatric Specialty Exam  Presentation  General Appearance:  Appropriate for Environment; Casual  Eye Contact: Good  Speech: Clear and Coherent; Normal Rate  Speech Volume: Normal  Handedness: Right   Mood and Affect  Mood: Euthymic  Duration of Depression Symptoms: Greater than two weeks  Affect: Appropriate; Congruent   Thought Process  Thought Processes: Coherent; Goal Directed  Descriptions of Associations:Intact  Orientation:Full (Time, Place and Person)  Thought Content:Logical; WDL  History of Schizophrenia/Schizoaffective disorder:Yes  Duration of Psychotic Symptoms:No data recorded Hallucinations:Hallucinations: None  Ideas of Reference:None  Suicidal Thoughts:Suicidal Thoughts: No  Homicidal Thoughts:Homicidal Thoughts: No   Sensorium  Memory: Immediate Fair  Judgment: Good  Insight: Good   Executive Functions  Concentration: Good  Attention Span: Good  Recall: Good  Fund of Knowledge: Good  Language: Good   Psychomotor Activity  Psychomotor Activity: Psychomotor Activity: Normal   Assets  Assets: Communication Skills; Desire for Improvement; Physical Health; Resilience   Sleep  Sleep: Sleep: Good Number of Hours of Sleep: 7.5   Physical Exam: Physical Exam Vitals and nursing note  reviewed.  Constitutional:      General: He is not in acute distress.    Appearance: Normal appearance. He is normal weight. He is not ill-appearing or toxic-appearing.  HENT:  Head: Normocephalic and atraumatic.  Pulmonary:     Effort: Pulmonary effort is normal.  Musculoskeletal:        General: Normal range of motion.  Neurological:     General: No focal deficit present.     Mental Status: He is alert.    Review of Systems  Respiratory:  Negative for cough and shortness of breath.   Cardiovascular:  Negative for chest pain.  Gastrointestinal:  Negative for abdominal pain, constipation, diarrhea, nausea and vomiting.  Neurological:  Negative for dizziness, weakness and headaches.  Psychiatric/Behavioral:  Negative for depression, hallucinations and suicidal ideas. The patient is not nervous/anxious.    Blood pressure 130/83, pulse 78, temperature 97.9 F (36.6 C), temperature source Oral, resp. rate 16, height 5\' 8"  (1.727 m), weight 58.5 kg, SpO2 100%. Body mass index is 19.61 kg/m.  Mental Status Per Nursing Assessment::   On Admission:  Suicidal ideation indicated by patient  Demographic Factors:  Male, Low socioeconomic status, and Unemployed  Loss Factors: Financial problems/change in socioeconomic status  Historical Factors: Prior suicide attempts  Risk Reduction Factors:   Living with another person, especially a relative, Positive social support, Positive therapeutic relationship, and Positive coping skills or problem solving skills  Continued Clinical Symptoms:  More than one psychiatric diagnosis  Cognitive Features That Contribute To Risk:  None    Suicide Risk:  Mild:  No SI.  There are no identifiable plans, no associated intent, mild dysphoria and related symptoms, good self-control (both objective and subjective assessment), few other risk factors, and identifiable protective factors, including available and accessible social support.  However, as  he does have a history of Prior Suicide Attempts there is some risk present.   Follow-up Information     [facility] Follow up on 08/14/2023.   Why: You have a hospital follow up appointment for therapy and medication management services on  08/14/23 at 1:45 pm.  The appointment will be Virtual telehealth. Contact information: [address]  [address] [place] [address] [phone]                 Plan Of Care/Follow-up recommendations:  Activity: as tolerated  Diet: heart healthy  Other: -Follow-up with your outpatient psychiatric provider -instructions on appointment date, time, and address (location) are provided to you in discharge paperwork.  -Take your psychiatric medications as prescribed at discharge - instructions are provided to you in the discharge paperwork  -Follow-up with outpatient primary care doctor and other specialists -for management of chronic medical disease, including: Routine medical care and on going support to maintain sobriety.  -Testing: Follow-up with outpatient provider for abnormal lab results: Routine monitoring for being on antipsychotic medications.  -Recommend abstinence from alcohol, tobacco, and other illicit drug use at discharge.   -If your psychiatric symptoms recur, worsen, or if you have side effects to your psychiatric medications, call your outpatient psychiatric provider, 911, [phone] or go to the nearest emergency department.  -If suicidal thoughts recur, call your outpatient psychiatric provider, 911, [phone] or go to the nearest emergency department.   Basilia Bosworth, DO 08/06/2023, 9:20 AM

## 2023-08-06 NOTE — Discharge Summary (Signed)
 Physician Discharge Summary Note  Patient:  Chris Olson. is an 33 y.o., male MRN:  [id number] DOB:  1990-06-22 Patient phone:  [phone] (home)  Patient address:   [address] [place] [facility] [address],  Total Time spent with patient: 20 minutes  Date of Admission:  08/03/2023 Date of Discharge: 08/06/2023  Reason for Admission:   Chris Olson. is a 33 y.o., male with history of MDD, alcohol use disorder, stimulant use disorder-cocaine, substance induced psychosis, schizoaffective disorder bipolar type, PTSD presented to [facility] 6/6 via [facility] due to SI with plan to overdose on cocaine and alcohol or "burn down the boarding house" he resides in. He presently receives psychiatric care at [facility] and has been medication compliant.  He has been taking lamotrigine  150 mg twice daily and cariprazine  3 mg daily. Primary stressors have been arguments with girlfriend, estrangement from family, and grief from mom passing 3 years ago. Reportedly used unknown amount of liquor and beer, 1/2 gram of cocaine, and unknown amt of cannabis the night before arriving at [facility] He has had similar psychiatric admissions at [facility] once in 2021 and 3 times in 2019.    Patient reports that she has he recently has had significant stress due to feelings of argument with girlfriend and this resulted in significant dysphoria because he felt that she was breaking up with him.  He had called her multiple times and nobody answered.  This resulted in him feeling very isolated and that nobody cared for him so he called 911 and had himself brought to [facility].  He reports being estranged from family and minimal social support in [place].  He reports he regularly keeps to himself with the exception of girlfriend and girlfriend's daughters.  He reports that prior to this argument with girlfriend, he did not experience any persistent depressed mood nor anhedonia.  He denies experiencing any problems with appetite.  He reports  restless sleep at times but he is able to fall asleep for 5-6 hours and awakens feeling well-rested. He denies history of mania nor psychosis. He reports last time he felt suicidal had been the last time he was hospitalized in 2021. Patient is able to contract for safety while on the unit.  He reports that since being hospitalized, he has talked with girlfriend and discovered that her girlfriend is not actually planning on leaving which immediately improved his mood. Girlfriend is staying with one of her daughters because he wanted to "cool off" and did not answer his phone calls because she was still experiencing anger and did not want to talk with them at that moment.   Patient reports smoking marijuana every day to aid with "stress". He reports daily alcohol use approximately 1 to 2 40 ounce beer. He reports experiencing difficulty cutting down on alcohol use, himself taking more than anticipated, continued drinking despite it making him feel depressed, and has a history of tremulousness after attempting to quit alcohol. He denies ever experiencing seizures or delirium tremens or required medical hospitalization for alcohol use.   He reports using cocaine about a 1-2 more times per month although is not certain why he uses cocaine. He reports previously having had success with AA and is looking to return to after discharge.  We also discussed him attending [facility] IOP which he was amenable to trying out.  Patient is contemplative regarding cannabis cessation but is motivated to quit nicotine , cocaine, and especially alcohol.  Principal Problem: Moderate alcohol use disorder [facility]) Discharge Diagnoses: Principal  Problem:   Moderate alcohol use disorder (HCC) Active Problems:   Cocaine use disorder, moderate, in controlled environment (HCC)   Moderate cannabis use disorder (HCC)   Adjustment disorder with mixed anxiety and depressed mood   Past Psychiatric History:  MDD, Schizoaffective Disorder, Bipolar  Type, PTSD, and Polysubstance Abuse (EtOH, Cocaine), Suicide Attempt (OD), and 4 Prior Psychiatric Hospitalizations [facility] 07/2019).   Past Medical History:  Past Medical History:  Diagnosis Date   Anxiety    Chest pain    Cocaine abuse with cocaine-induced mood disorder (HCC) 11/11/2017   Depression    Pericarditis    Schizophrenia (HCC)    History reviewed. No pertinent surgical history. Family History: History reviewed. No pertinent family history. Family Psychiatric  History:  Reports None   Social History:  Social History   Substance and Sexual Activity  Alcohol Use Yes   Alcohol/week: 15.0 standard drinks of alcohol   Types: 15 Cans of beer per week   Comment: (2) 40 oz beers per day most days     Social History   Substance and Sexual Activity  Drug Use Yes   Types: Marijuana, Cocaine    Social History   Socioeconomic History   Marital status: Divorced    Spouse name: Not on file   Number of children: Not on file   Years of education: Not on file   Highest education level: Not on file  Occupational History   Not on file  Tobacco Use   Smoking status: Every Day    Current packs/day: 0.75    Types: Cigarettes   Smokeless tobacco: Never  Vaping Use   Vaping status: Never Used  Substance and Sexual Activity   Alcohol use: Yes    Alcohol/week: 15.0 standard drinks of alcohol    Types: 15 Cans of beer per week    Comment: (2) 40 oz beers per day most days   Drug use: Yes    Types: Marijuana, Cocaine   Sexual activity: Not Currently  Other Topics Concern   Not on file  Social History Narrative   Not on file   Social Drivers of Health   Financial Resource Strain: Medium Risk (09/10/2019)   Overall Financial Resource Strain (CARDIA)    Difficulty of Paying Living Expenses: Somewhat hard  Food Insecurity: Food Insecurity Present (08/03/2023)   Hunger Vital Sign    Worried About Running Out of Food in the Last Year: Sometimes true    Ran Out of Food in the  Last Year: Sometimes true  Transportation Needs: No Transportation Needs (08/03/2023)   PRAPARE - Administrator, Civil Service (Medical): No    Lack of Transportation (Non-Medical): No  Physical Activity: Inactive (09/10/2019)   Exercise Vital Sign    Days of Exercise per Week: 0 days    Minutes of Exercise per Session: 0 min  Stress: Stress Concern Present (09/10/2019)   [employer] of Occupational Health - Occupational Stress Questionnaire    Feeling of Stress : Very much  Social Connections: Socially Isolated (09/10/2019)   Social Connection and Isolation Panel [NHANES]    Frequency of Communication with Friends and Family: Never    Frequency of Social Gatherings with Friends and Family: Never    Attends Religious Services: Never    Database administrator or Organizations: No    Attends Banker Meetings: Never    Marital Status: Separated    Hospital Course:   During the patient's hospitalization, patient had  extensive initial psychiatric evaluation, and follow-up psychiatric evaluations every day.  Psychiatric diagnoses provided upon initial assessment: Cocaine use disorder, moderate, in controlled environment (HCC)   Moderate cannabis use disorder (HCC)   Adjustment disorder with mixed anxiety and depressed mood  Patient's psychiatric medications were adjusted on admission: Continued home Vraylar  and Lamictal    During the hospitalization, other adjustments were made to the patient's psychiatric medication regimen: None`  Patient's care was discussed during the interdisciplinary team meeting every day during the hospitalization.  The patient is not having side effects to prescribed psychiatric medication.  Gradually, patient started adjusting to milieu. The patient was evaluated each day by a clinical provider to ascertain response to treatment. Improvement was noted by the patient's report of decreasing symptoms, improved sleep and appetite, affect,  medication tolerance, behavior, and participation in unit programming.  Patient was asked each day to complete a self inventory noting mood, mental status, pain, new symptoms, anxiety and concerns.   Symptoms were reported as significantly decreased or resolved completely by discharge.  The patient reports that their mood is stable.  The patient denied having suicidal thoughts for more than 48 hours prior to discharge.  Patient denies having homicidal thoughts.  Patient denies having auditory hallucinations.  Patient denies any visual hallucinations or other symptoms of psychosis.  The patient was motivated to continue taking medication with a goal of continued improvement in mental health.   The patient reports their target psychiatric symptoms of depression and SI  responded well to the psychiatric medications, and the patient reports overall benefit other psychiatric hospitalization. Supportive psychotherapy was provided to the patient. The patient also participated in regular group therapy while hospitalized. Coping skills, problem solving as well as relaxation therapies were also part of the unit programming.  Labs were reviewed with the patient, and abnormal results were discussed with the patient.  The patient is able to verbalize their individual safety plan to this provider.  # It is recommended to the patient to continue psychiatric medications as prescribed, after discharge from the hospital.    # It is recommended to the patient to follow up with your outpatient psychiatric provider and PCP.  # It was discussed with the patient, the impact of alcohol, drugs, tobacco have been there overall psychiatric and medical wellbeing, and total abstinence from substance use was recommended the patient.ed.  # Prescriptions provided or sent directly to preferred pharmacy at discharge. Patient agreeable to plan. Given opportunity to ask questions. Appears to feel comfortable with discharge.    # In  the event of worsening symptoms, the patient is instructed to call the crisis hotline, 911 and or go to the nearest ED for appropriate evaluation and treatment of symptoms. To follow-up with primary care provider for other medical issues, concerns and or health care needs  # Patient was discharged home with a plan to follow up as noted below.    On day of discharge he reports that he is feeling much better.  Discussed with him the importance of taking his medications as prescribed and making his outpatient follow-up appointment and he reported understanding.  Discussed with him that eliminating substance use would be best for him.  Encouraged attending AA meetings and he reports he will do so.  Discussed with him what to do in the event of a future crisis.  Discussed that he can go to [facility], go to the nearest ED, or call 911 or 988.   He reported understanding and had no  concerns.  He reports his sleep is good.  He reports his appetite is doing good.  He reports no side effects to his medications.  He reports no SI, HI, or AVH.  He was discharged home with his girlfriend.    Physical Findings: AIMS: Facial and Oral Movements Muscles of Facial Expression: None Lips and Perioral Area: None Jaw: None Tongue: None,Extremity Movements Upper (arms, wrists, hands, fingers): None Lower (legs, knees, ankles, toes): None, Trunk Movements Neck, shoulders, hips: None, Global Judgements Severity of abnormal movements overall : None Incapacitation due to abnormal movements: None Patient's awareness of abnormal movements: No Awareness,    CIWA:  CIWA-Ar Total: 1 COWS:     Musculoskeletal: Strength & Muscle Tone: within normal limits Gait & Station: normal Patient leans: N/A   Psychiatric Specialty Exam:  Presentation  General Appearance:  Appropriate for Environment; Casual  Eye Contact: Good  Speech: Clear and Coherent; Normal Rate  Speech Volume: Normal  Handedness: Right   Mood  and Affect  Mood: Euthymic  Affect: Appropriate; Congruent   Thought Process  Thought Processes: Coherent; Goal Directed  Descriptions of Associations:Intact  Orientation:Full (Time, Place and Person)  Thought Content:Logical; WDL  History of Schizophrenia/Schizoaffective disorder:Yes  Duration of Psychotic Symptoms:No data recorded Hallucinations:Hallucinations: None  Ideas of Reference:None  Suicidal Thoughts:Suicidal Thoughts: No  Homicidal Thoughts:Homicidal Thoughts: No   Sensorium  Memory: Immediate Fair  Judgment: Good  Insight: Good   Executive Functions  Concentration: Good  Attention Span: Good  Recall: Good  Fund of Knowledge: Good  Language: Good   Psychomotor Activity  Psychomotor Activity: Psychomotor Activity: Normal   Assets  Assets: Communication Skills; Desire for Improvement; Physical Health; Resilience   Sleep  Sleep: Sleep: Good Number of Hours of Sleep: 7.5    Physical Exam: Physical Exam Vitals and nursing note reviewed.  Constitutional:      General: He is not in acute distress.    Appearance: Normal appearance. He is normal weight. He is not ill-appearing or toxic-appearing.  HENT:     Head: Normocephalic and atraumatic.  Pulmonary:     Effort: Pulmonary effort is normal.  Musculoskeletal:        General: Normal range of motion.  Neurological:     General: No focal deficit present.     Mental Status: He is alert.    Review of Systems  Respiratory:  Negative for cough and shortness of breath.   Cardiovascular:  Negative for chest pain.  Gastrointestinal:  Negative for abdominal pain, constipation, diarrhea, nausea and vomiting.  Neurological:  Negative for dizziness, weakness and headaches.  Psychiatric/Behavioral:  Negative for depression, hallucinations and suicidal ideas. The patient is not nervous/anxious.    Blood pressure 130/83, pulse 78, temperature 97.9 F (36.6 C), temperature source  Oral, resp. rate 16, height 5\' 8"  (1.727 m), weight 58.5 kg, SpO2 100%. Body mass index is 19.61 kg/m.   Social History   Tobacco Use  Smoking Status Every Day   Current packs/day: 0.75   Types: Cigarettes  Smokeless Tobacco Never   Tobacco Cessation:  A prescription for an FDA-approved tobacco cessation medication provided at discharge   Blood Alcohol level:  Lab Results  Component Value Date   [facility] <15 08/02/2023   ETH 86 (H) 09/13/2021    Metabolic Disorder Labs:  Lab Results  Component Value Date   HGBA1C 5.0 08/05/2023   MPG 97 08/05/2023   MPG 96.8 09/13/2021   No results found for: "PROLACTIN" Lab Results  Component  Value Date   CHOL 142 08/05/2023   TRIG 70 08/05/2023   HDL 51 08/05/2023   CHOLHDL 2.8 08/05/2023   VLDL 14 08/05/2023   LDLCALC 77 08/05/2023   LDLCALC 110 (H) 09/13/2021    See Psychiatric Specialty Exam and Suicide Risk Assessment completed by Attending Physician prior to discharge.  Discharge destination:  Home  Is patient on multiple antipsychotic therapies at discharge:  No   Has Patient had three or more failed trials of antipsychotic monotherapy by history:  No  Recommended Plan for Multiple Antipsychotic Therapies: NA  Discharge Instructions     Diet - low sodium heart healthy   Complete by: As directed    Increase activity slowly   Complete by: As directed       Allergies as of 08/06/2023   No Known Allergies      Medication List     TAKE these medications      Indication  hydrOXYzine  25 MG tablet Commonly known as: ATARAX  Take 1 tablet (25 mg total) by mouth every 6 (six) hours as needed for anxiety.  Indication: Feeling Anxious   lamoTRIgine  150 MG tablet Commonly known as: LAMICTAL  Take 150 mg by mouth 2 (two) times daily.  Indication: Mood Stability   melatonin 5 MG Tabs Take 1 tablet (5 mg total) by mouth at bedtime.  Indication: Trouble Sleeping   nicotine  polacrilex 2 MG gum Commonly known as:  NICORETTE  Take 1 each (2 mg total) by mouth as needed for smoking cessation.  Indication: Nicotine  Addiction   omeprazole  20 MG capsule Commonly known as: PRILOSEC Take 1 capsule (20 mg total) by mouth daily.  Indication: Heartburn   polyethylene glycol 17 g packet Commonly known as: MIRALAX / GLYCOLAX Take 17 g by mouth daily.  Indication: Constipation   Vraylar  3 MG capsule Generic drug: cariprazine  Take 3 mg by mouth daily. What changed: Another medication with the same name was removed. Continue taking this medication, and follow the directions you see here.  Indication: Major Depressive Disorder        Follow-up Information     [facility] Follow up on 08/14/2023.   Why: You have a hospital follow up appointment for therapy and medication management services on  08/14/23 at 1:45 pm.  The appointment will be Virtual telehealth. Contact information: [address]  [address] [place] [address] [phone]                 Follow-up recommendations/Comments:   Activity: as tolerated   Diet: heart healthy   Other: -Follow-up with your outpatient psychiatric provider -instructions on appointment date, time, and address (location) are provided to you in discharge paperwork.   -Take your psychiatric medications as prescribed at discharge - instructions are provided to you in the discharge paperwork   -Follow-up with outpatient primary care doctor and other specialists -for management of chronic medical disease, including: Routine medical care and on going support to maintain sobriety.   -Testing: Follow-up with outpatient provider for abnormal lab results: Routine monitoring for being on antipsychotic medications.   -Recommend abstinence from alcohol, tobacco, and other illicit drug use at discharge.    -If your psychiatric symptoms recur, worsen, or if you have side effects to your psychiatric medications, call your outpatient psychiatric provider, 911, [phone] or  go to the nearest emergency department.   -If suicidal thoughts recur, call your outpatient psychiatric provider, 911, 988 or go to the nearest emergency department.  Signed: Basilia Bosworth, DO  08/06/2023, 4:27 PM

## 2023-08-06 NOTE — Plan of Care (Signed)

## 2023-08-07 NOTE — [facility] Group Notes (Signed)
 Spiritual care group on grief and loss facilitated by Chaplain Nick Barman, [facility]  Group Goal: Support / Education around grief and loss  Members engage in facilitated group support and psycho-social education.  Group Description:  Following introductions and group rules, group members engaged in facilitated group dialogue and support around topic of loss, with particular support around experiences of loss in their lives. Group Identified types of loss (relationships / self / things) and identified patterns, circumstances, and changes that precipitate losses. Reflected on thoughts / feelings around loss, normalized grief responses, and recognized variety in grief experience. Group encouraged individual reflection on safe space and on the coping skills that they are already utilizing.  Group drew on Adlerian / Rogerian and narrative framework  Patient Progress: Chris Olson attended group and actively engaged and participated in group conversation and activities.

## 2023-08-16 ENCOUNTER — Encounter: Payer: Self-pay | Admitting: Gastroenterology

## 2023-08-16 ENCOUNTER — Ambulatory Visit: Payer: MEDICAID | Admitting: Gastroenterology

## 2023-08-16 VITALS — BP 108/70 | HR 82 | Ht 68.0 in | Wt 136.1 lb

## 2023-08-16 DIAGNOSIS — R102 Pelvic and perineal pain: Secondary | ICD-10-CM | POA: Diagnosis not present

## 2023-08-16 DIAGNOSIS — F1410 Cocaine abuse, uncomplicated: Secondary | ICD-10-CM

## 2023-08-16 DIAGNOSIS — R194 Change in bowel habit: Secondary | ICD-10-CM | POA: Diagnosis not present

## 2023-08-16 DIAGNOSIS — R140 Abdominal distension (gaseous): Secondary | ICD-10-CM | POA: Diagnosis not present

## 2023-08-16 DIAGNOSIS — F209 Schizophrenia, unspecified: Secondary | ICD-10-CM

## 2023-08-16 DIAGNOSIS — K6289 Other specified diseases of anus and rectum: Secondary | ICD-10-CM

## 2023-08-16 MED ORDER — NA SULFATE-K SULFATE-MG SULF 17.5-3.13-1.6 GM/177ML PO SOLN: 1.0000 | Freq: Once | ORAL | 0 refills | Status: AC

## 2023-08-16 NOTE — Patient Instructions (Signed)
 You have been scheduled for a colonoscopy. Please follow written instructions given to you at your visit today.   If you use inhalers (even only as needed), please bring them with you on the day of your procedure.  DO NOT TAKE 7 DAYS PRIOR TO TEST- Trulicity (dulaglutide) Ozempic, Wegovy (semaglutide) Mounjaro (tirzepatide) Bydureon Bcise (exanatide extended release)  DO NOT TAKE 1 DAY PRIOR TO YOUR TEST Rybelsus (semaglutide) Adlyxin (lixisenatide) Victoza (liraglutide) Byetta (exanatide) ____________________________________________________________________  _______________________________________________________  If your blood pressure at your visit was 140/90 or greater, please contact your primary care physician to follow up on this.  _______________________________________________________  If you are age 35 or older, your body mass index should be between 23-30. Your Body mass index is 20.7 kg/m. If this is out of the aforementioned range listed, please consider follow up with your Primary Care Provider.  If you are age 28 or younger, your body mass index should be between 19-25. Your Body mass index is 20.7 kg/m. If this is out of the aformentioned range listed, please consider follow up with your Primary Care Provider.   ________________________________________________________  The [facility] providers would like to encourage you to use MYCHART to communicate with providers for non-urgent requests or questions.  Due to long hold times on the telephone, sending your provider a message by [facility] may be a faster and more efficient way to get a response.  Please allow 48 business hours for a response.  Please remember that this is for non-urgent requests.  _______________________________________________________

## 2023-08-16 NOTE — Progress Notes (Signed)
 08/16/2023 Chris [facility] [id number] [birth year]   HISTORY OF PRESENT ILLNESS: This is a 33 year old male who is new to our office.  He was referred here for evaluation of several GI complaints.  He says that he has been having itching in his rectum and feeling like there is movement in his rectum.  He says that he also gets very bloated at times and his abdomen feels tight.  He says that he gets pain in his abdomen, points suprapubically and pushes on the area.  He says he also gets some pains on the sides at times.  He is concerned because he has an increase in appetite and feels like he eats a lot and just cannot get full or gain weight.  He says within an hour or so after eating he feels hungry again like he has never even eaten.  He mentions a couple time wanting to make sure that he is healthy and okay.  They did treat him with mebendazole  I think through the ER.  He said that that did seem to help some and he feels like it is better, but he is not sure if he waited too long to treat his symptoms and if there is more parasite or worms in their that maybe did not respond to the medication, etc.  He reports that he had had some coughing and he remembers that before one of the dogs had worms that they would cough up forearms, etc.  Says that sometimes he feels constipated, but will just use Dulcolax and that helps him move his bowels.  No diarrhea issues.  He saw Dr. Honey [facility] with [facility] for the same complaint in March.  He said in his note that he tried to provide reassurance that symptoms are not consistent with parasitic infection and that he thought his psychiatric issues were contributing to the feelings that he was experiencing and that he needed to follow-up with his primary care physician.  Dr. Honey Olson declined to schedule colonoscopy.  Patient subsequently was referred here for the same symptoms.  Has past medical history of anxiety/depression, schizophrenia, cocaine abuse.   Had a recent inpatient psychiatric stay.   Past Medical History:  Diagnosis Date   Alcoholism (HCC)    Anxiety    Chest pain    Cocaine abuse with cocaine-induced mood disorder (HCC) 11/11/2017   Depression    Pericarditis    Schizophrenia (HCC)    History reviewed. No pertinent surgical history.  reports that he has been smoking cigarettes. He has never used smokeless tobacco. He reports current alcohol use of about 15.0 standard drinks of alcohol per week. He reports current drug use. Drugs: Marijuana and Cocaine. family history is not on file. No Known Allergies    Outpatient Encounter Medications as of 08/16/2023  Medication Sig   cariprazine  (VRAYLAR ) 3 MG capsule Take 3 mg by mouth daily.   hydrOXYzine  (ATARAX /VISTARIL ) 25 MG tablet Take 1 tablet (25 mg total) by mouth every 6 (six) hours as needed for anxiety.   lamoTRIgine  (LAMICTAL ) 150 MG tablet Take 150 mg by mouth 2 (two) times daily.   melatonin 5 MG TABS Take 1 tablet (5 mg total) by mouth at bedtime.   nicotine  polacrilex (NICORETTE ) 2 MG gum Take 1 each (2 mg total) by mouth as needed for smoking cessation.   omeprazole  (PRILOSEC) 20 MG capsule Take 1 capsule (20 mg total) by mouth daily.   polyethylene glycol (MIRALAX  / GLYCOLAX ) 17 g packet  Take 17 g by mouth daily.   No facility-administered encounter medications on file as of 08/16/2023.    REVIEW OF SYSTEMS  : All other systems reviewed and negative except where noted in the History of Present Illness.   PHYSICAL EXAM: BP 108/70 (BP Location: Right Arm, Patient Position: Sitting, Cuff Size: Normal)   Pulse 82   Ht 5' 8 (1.727 m)   Wt 136 lb 2 oz (61.7 kg)   BMI 20.70 kg/m  General: Well developed AA male in no acute distress Head: Normocephalic and atraumatic Eyes:  Sclerae anicteric, conjunctiva pink. Ears: Normal auditory acuity Lungs: Clear throughout to auscultation; no W/R/R. Heart: Regular rate and rhythm; no M/R/G. Abdomen: Soft,  non-distended.  BS present.  Non-tender. Rectal:  Will be done at the time of colonoscopy. Musculoskeletal: Symmetrical with no gross deformities  Skin: No lesions on visible extremities Extremities: No edema  Neurological: Alert oriented x 4, grossly non-focal Psychological:  Alert and cooperative. Normal mood and affect  ASSESSMENT AND PLAN: *33 year old male with complaints of bloating, suprapubic abdominal pain, some other more diffuse nonspecific abdominal pain, and altered bowel habits.  He also reports feeling like there is movement in his rectum.  He was concerned that he had parasites/worms.  He was treated with mebendazole  and it has somewhat improved.  Reports wanting reassurance that everything is okay and that he is healthy.  I think the easiest way to clear his mind about any type of parasites/worms is going to be to clean him out and perform colonoscopy.  He is interested in proceeding with this.  Colonoscopy scheduled Dr. Willy Olson.  The risks, benefits, and alternatives to colonoscopy were discussed with the patient and he consents to proceed.  *Schizophrenia: Recently had an inpatient hospital stay. *Cocaine abuse: Reports that he has not been using cocaine recently.  Advised that he should refrain from using for a week prior to his colonoscopy procedure.   CC:  [facility]*

## 2023-08-20 ENCOUNTER — Other Ambulatory Visit: Payer: Self-pay | Admitting: *Deleted

## 2023-08-20 ENCOUNTER — Telehealth: Payer: Self-pay | Admitting: *Deleted

## 2023-08-20 DIAGNOSIS — R194 Change in bowel habit: Secondary | ICD-10-CM

## 2023-08-20 DIAGNOSIS — R102 Pelvic and perineal pain: Secondary | ICD-10-CM

## 2023-08-20 DIAGNOSIS — K5900 Constipation, unspecified: Secondary | ICD-10-CM

## 2023-08-20 DIAGNOSIS — R140 Abdominal distension (gaseous): Secondary | ICD-10-CM

## 2023-08-20 NOTE — Telephone Encounter (Signed)
 Patient informed. Colonoscopy canceled. Stool studies ordered and follow up scheduled.

## 2023-08-20 NOTE — Addendum Note (Signed)
 Addended by: WILL POWELL CROME on: 08/20/2023 01:26 PM   Modules accepted: Orders

## 2023-08-20 NOTE — Telephone Encounter (Signed)
 Per Dr. Avram abdominal x-ray added as well and patient informed.

## 2023-08-20 NOTE — Telephone Encounter (Signed)
 error

## 2023-08-20 NOTE — Telephone Encounter (Signed)
-----   Message from Wasilla D. Zehr sent at 08/20/2023  9:25 AM EDT ----- Regarding: RE: cancel colonoscopy Ok will do.  [facility], please cancel patient's colonoscopy.  Dr. Avram does not feel it is appropriate at this time.  Please have him perform an ova and parasite stool test and get him a follow-up appointment with Dr. Avram to discuss at a later date if he thinks appropriate at that time.  Thank you,  Jess ----- Message ----- From: Avram Lupita BRAVO, MD Sent: 08/20/2023   7:55 AM EDT To: Harlene JONETTA Mail, PA-C Subject: cancel colonoscopy                             Jess,  I do not think colonoscopy is appropriate for this patient.  I have concerns about someone mentally ill and actively using cocaine being sedated in the [facility] and the indications here are weak.  If he is concerned about parasites can do stool tests.  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  Please cancel the colonoscopy and we can discuss further in person.  Lupita

## 2023-08-23 ENCOUNTER — Other Ambulatory Visit: Payer: MEDICAID

## 2023-08-23 DIAGNOSIS — R194 Change in bowel habit: Secondary | ICD-10-CM

## 2023-08-23 DIAGNOSIS — R102 Pelvic and perineal pain: Secondary | ICD-10-CM

## 2023-08-28 ENCOUNTER — Ambulatory Visit: Payer: Self-pay | Admitting: Physician Assistant

## 2023-08-28 LAB — OVA AND PARASITE EXAMINATION
CONCENTRATE RESULT:: NONE SEEN
MICRO NUMBER:: 16634143
SPECIMEN QUALITY:: ADEQUATE
TRICHROME RESULT:: NONE SEEN

## 2023-09-17 ENCOUNTER — Encounter: Payer: MEDICAID | Admitting: Internal Medicine

## 2023-10-22 ENCOUNTER — Ambulatory Visit: Payer: MEDICAID | Admitting: Internal Medicine

## 2023-10-28 ENCOUNTER — Emergency Department
Admission: EM | Admit: 2023-10-28 | Discharge: 2023-10-28 | Disposition: A | Discharge status: Home / Self Care | Payer: MEDICAID | Attending: Emergency Medicine | Admitting: Emergency Medicine

## 2023-10-28 ENCOUNTER — Other Ambulatory Visit: Payer: Self-pay

## 2023-10-28 DIAGNOSIS — R21 Rash and other nonspecific skin eruption: Secondary | ICD-10-CM | POA: Diagnosis present

## 2023-10-28 DIAGNOSIS — B372 Candidiasis of skin and nail: Secondary | ICD-10-CM | POA: Insufficient documentation

## 2023-10-28 LAB — URINALYSIS, ROUTINE W REFLEX MICROSCOPIC
Bacteria, UA: NONE SEEN
Bilirubin Urine: NEGATIVE
Glucose, UA: NEGATIVE mg/dL
Hgb urine dipstick: NEGATIVE
Ketones, ur: NEGATIVE mg/dL
Nitrite: NEGATIVE
Protein, ur: NEGATIVE mg/dL
Specific Gravity, Urine: 1.003 — ABNORMAL LOW (ref 1.005–1.030)
pH: 6 (ref 5.0–8.0)

## 2023-10-28 MED ORDER — NYSTATIN-TRIAMCINOLONE 100000-0.1 UNIT/GM-% EX OINT: 1.0000 | TOPICAL_OINTMENT | Freq: Two times a day (BID) | CUTANEOUS | 0 refills | Status: DC

## 2023-10-28 MED ORDER — FLUCONAZOLE 50 MG PO TABS
150.0000 mg | ORAL_TABLET | Freq: Once | ORAL | Status: AC
  Administered 2023-10-28: 150 mg via ORAL
  Filled 2023-10-28: qty 1

## 2023-10-28 MED ORDER — NYSTATIN-TRIAMCINOLONE 100000-0.1 UNIT/GM-% EX OINT
1.0000 | TOPICAL_OINTMENT | Freq: Once | CUTANEOUS | Status: AC
  Administered 2023-10-28: 1 via TOPICAL
  Filled 2023-10-28: qty 15

## 2023-10-28 NOTE — ED Provider Notes (Signed)
 [facility] Provider Note    Event Date/Time   First MD Initiated Contact with Patient 10/28/23 2322     (approximate)   History   Rash   HPI  Chris Olson. is a 33 y.o. male with history of schizophrenia, substance abuse who presents to the emergency department with diffuse burning, itching, redness around his genitalia.  No new exposures.  No dysuria, penile discharge, hematuria.  No fever.  No abdominal pain.  He denies concerns for STIs.   History provided by patient, family member.    Past Medical History:  Diagnosis Date   Alcoholism (HCC)    Anxiety    Chest pain    Cocaine abuse with cocaine-induced mood disorder (HCC) 11/11/2017   Depression    Pericarditis    Schizophrenia (HCC)     No past surgical history on file.  MEDICATIONS:  Prior to Admission medications   Medication Sig Start Date End Date Taking? Authorizing Provider  cariprazine  (VRAYLAR ) 3 MG capsule Take 3 mg by mouth daily.    [provider]  hydrOXYzine  (ATARAX /VISTARIL ) 25 MG tablet Take 1 tablet (25 mg total) by mouth every 6 (six) hours as needed for anxiety. 08/29/19   Collene Gouge I, NP  lamoTRIgine  (LAMICTAL ) 150 MG tablet Take 150 mg by mouth 2 (two) times daily.    [provider]  melatonin 5 MG TABS Take 1 tablet (5 mg total) by mouth at bedtime. 08/06/23   Raliegh Marsa RAMAN, DO  nicotine  polacrilex (NICORETTE ) 2 MG gum Take 1 each (2 mg total) by mouth as needed for smoking cessation. 08/06/23   Pashayan, Marsa RAMAN, DO  omeprazole  (PRILOSEC) 20 MG capsule Take 1 capsule (20 mg total) by mouth daily. 06/21/23   Piontek, Rocky, MD  polyethylene glycol (MIRALAX  / GLYCOLAX ) 17 g packet Take 17 g by mouth daily. 08/06/23   Raliegh Marsa RAMAN, DO    Physical Exam   Triage Vital Signs: ED Triage Vitals [10/28/23 2310]  Encounter Vitals Group     BP (!) 122/94     Girls Systolic BP Percentile      Girls Diastolic BP Percentile       Boys Systolic BP Percentile      Boys Diastolic BP Percentile      Pulse Rate (!) 59     Resp 16     Temp 98.2 F (36.8 C)     Temp Source Oral     SpO2 100 %     Weight 135 lb (61.2 kg)     Height 5' 8 (1.727 m)     Head Circumference      Peak Flow      Pain Score 6     Pain Loc      Pain Education      Exclude from Growth Chart     Most recent vital signs: Vitals:   10/28/23 2310  BP: (!) 122/94  Pulse: (!) 59  Resp: 16  Temp: 98.2 F (36.8 C)  SpO2: 100%     CONSTITUTIONAL: Alert and responds appropriately to questions. Well-appearing; well-nourished HEAD: Normocephalic, atraumatic EYES: Conjunctivae clear, pupils appear equal ENT: normal nose; moist mucous membranes NECK: Normal range of motion CARD: Regular rate and rhythm RESP: Normal chest excursion without splinting or tachypnea; no hypoxia or respiratory distress, speaking full sentences ABD/GI: non-distended, nontender to palpation GU: Patient has diffuse erythematous patches of slightly swollen skin around the pubic area not involving the penis  or scrotum.  No vesicles, blisters, desquamation.  No scrotal swelling, scrotal or testicular tenderness, testicular masses, high riding testicle.  No crepitus.  No hernia. EXT: Normal ROM in all joints, no major deformities noted SKIN: Normal color for age and race, no rashes on exposed skin NEURO: Moves all extremities equally, normal speech, no facial asymmetry noted PSYCH: The patient's mood and manner are appropriate. Grooming and personal hygiene are appropriate.  ED Results / Procedures / Treatments   LABS: (all labs ordered are listed, but only abnormal results are displayed) Labs Reviewed  URINALYSIS, ROUTINE W REFLEX MICROSCOPIC - Abnormal; Notable for the following components:      Result Value   Color, Urine STRAW (*)    APPearance CLEAR (*)    Specific Gravity, Urine 1.003 (*)    Leukocytes,Ua TRACE (*)    All other components within normal  limits  CHLAMYDIA/NGC RT PCR ([facility] ONLY)               EKG:  EKG Interpretation Date/Time:    Ventricular Rate:    PR Interval:    QRS Duration:    QT Interval:    QTC Calculation:   R Axis:      Text Interpretation:            RADIOLOGY: My personal review and interpretation of imaging:    I have personally reviewed all radiology reports. No results found.   PROCEDURES:  Critical Care performed: No     Procedures    IMPRESSION / MDM / ASSESSMENT AND PLAN / ED COURSE  I reviewed the triage vital signs and the nursing notes.   Patient here with genital candidiasis.  No signs of cellulitis.    DIFFERENTIAL DIAGNOSIS (includes but not limited to):   Candidiasis, tinea cruris, no signs of cellulitis, doubt syphilis, herpes, pubic lice  Patient's presentation is most consistent with acute complicated illness / injury requiring diagnostic workup.  PLAN: Will send off urine to test for STIs but low clinical suspicion that is what is causing his rash today.  This appears to be fungal in nature.  Will start him on nystatin  with triamcinolone .  Will give dose of Diflucan  here.  No indication for antibiotics.  Recommended Tylenol , Motrin as needed for discomfort.   MEDICATIONS GIVEN IN ED: Medications  fluconazole  (DIFLUCAN ) tablet 150 mg (150 mg Oral Given 10/28/23 2338)  nystatin -triamcinolone  ointment (MYCOLOG) 1 Application (1 Application Topical Given 10/28/23 2339)     ED COURSE: Urine shows no sign of infection.  Gonorrhea and Chlamydia negative.   At this time, I do not feel there is any life-threatening condition present. I reviewed all nursing notes, vitals, pertinent previous records.  All lab and urine results, EKGs, imaging ordered have been independently reviewed and interpreted by myself.  I reviewed all available radiology reports from any imaging ordered this visit.  Based on my assessment, I feel the patient is safe to be discharged home without  further emergent workup and can continue workup as an outpatient as needed. Discussed all findings, treatment plan as well as usual and customary return precautions.  They verbalize understanding and are comfortable with this plan.  Outpatient follow-up has been provided as needed.  All questions have been answered.    CONSULTS:  none   OUTSIDE RECORDS REVIEWED: Reviewed recent gastroenterology notes.     FINAL CLINICAL IMPRESSION(S) / ED DIAGNOSES   Final diagnoses:  Skin yeast infection     Rx / DC Orders  ED Discharge Orders          Ordered    nystatin -triamcinolone  ointment (MYCOLOG)  2 times daily        10/28/23 2328             Note:  This document was prepared using Dragon voice recognition software and may include unintentional dictation errors.   Kit Mollett, Josette SAILOR, DO 10/29/23 [phone]

## 2023-10-28 NOTE — Discharge Instructions (Addendum)
 You may alternate over the counter Tylenol 1000 mg every 6 hours as needed for pain, fever and Ibuprofen 800 mg every 6-8 hours as needed for pain, fever.  Please take Ibuprofen with food.  Do not take more than 4000 mg of Tylenol (acetaminophen) in a 24 hour period.

## 2023-10-28 NOTE — ED Triage Notes (Signed)
 Patient ambulatory to triage with complaints of rash to the genital area x1 week. Patient states it is extremely itchy and has a burning feeling. Patient denies discharge. States it is red and irritated looking but no actual bumps/sores.

## 2023-10-29 LAB — CHLAMYDIA/NGC RT PCR (ARMC ONLY)
Chlamydia Tr: NOT DETECTED
N gonorrhoeae: NOT DETECTED

## 2024-02-21 ENCOUNTER — Emergency Department
Admission: EM | Admit: 2024-02-21 | Discharge: 2024-02-21 | Disposition: A | Discharge status: Home / Self Care | Payer: MEDICAID | Attending: Emergency Medicine | Admitting: Emergency Medicine

## 2024-02-21 ENCOUNTER — Encounter: Payer: Self-pay | Admitting: Emergency Medicine

## 2024-02-21 ENCOUNTER — Other Ambulatory Visit: Payer: Self-pay

## 2024-02-21 ENCOUNTER — Emergency Department: Payer: MEDICAID

## 2024-02-21 DIAGNOSIS — M79641 Pain in right hand: Secondary | ICD-10-CM | POA: Insufficient documentation

## 2024-02-21 MED ORDER — IBUPROFEN 600 MG PO TABS
600.0000 mg | ORAL_TABLET | Freq: Once | ORAL | Status: AC
  Administered 2024-02-21: 600 mg via ORAL
  Filled 2024-02-21: qty 1

## 2024-02-21 MED ORDER — ACETAMINOPHEN 325 MG PO TABS
650.0000 mg | ORAL_TABLET | Freq: Once | ORAL | Status: AC
  Administered 2024-02-21: 650 mg via ORAL
  Filled 2024-02-21: qty 2

## 2024-02-21 NOTE — ED Provider Notes (Signed)
 "  [facility] Provider Note    Event Date/Time   First MD Initiated Contact with Patient 02/21/24 1720     (approximate)   History   Hand Injury   HPI  Chris Mosiah Bastin. is a 33 y.o. male  with a past medical history of schizophrenia, cocaine use disorder, alcohol use disorder, polysubstance abuse, major depression presents to the emergency department with right hand pain and swelling that has been present for 3 to 4 weeks.  Patient reports he punched a wall a few weeks ago and that is when the swelling started.  He did not get seen for this injury.  He also reports another injury that happened yesterday when he was sliding his hand across a table while carrying a heavy box of objects when the box hit his hand.  He denies numbness or tingling.  He has not been taking any medication at home.   Physical Exam   Triage Vital Signs: ED Triage Vitals [02/21/24 1624]  Encounter Vitals Group     BP 131/85     Girls Systolic BP Percentile      Girls Diastolic BP Percentile      Boys Systolic BP Percentile      Boys Diastolic BP Percentile      Pulse Rate 81     Resp 16     Temp 98.2 F (36.8 C)     Temp Source Oral     SpO2 98 %     Weight 134 lb 14.7 oz (61.2 kg)     Height 5' 8 (1.727 m)     Head Circumference      Peak Flow      Pain Score 10     Pain Loc      Pain Education      Exclude from Growth Chart     Most recent vital signs: Vitals:   02/21/24 1624  BP: 131/85  Pulse: 81  Resp: 16  Temp: 98.2 F (36.8 C)  SpO2: 98%   General: Awake, in no acute distress.  Head: Normocephalic, atraumatic. CV: Good peripheral perfusion. Radial pulses 2+ b/l. Cap refill <2 sec. Respiratory:Normal respiratory effort.  No respiratory distress.  GI: Soft, non-distended. MSK: Normal ROM and 5/5 strength in all b/l finger joints and wrists.  Able to perform thumb opposition. Skin:Warm, dry, TTP along posterior aspect of right 4th and 5th digit knuckles  with some mild overlying swelling, no open wound. Hand compartments are soft. Neurological: A&Ox4 to person, place, time, and situation. Sensation intact and equal to b/l fingers. Strength symmetric.    ED Results / Procedures / Treatments   Labs (all labs ordered are listed, but only abnormal results are displayed) Labs Reviewed - No data to display   EKG     RADIOLOGY X ray right hand IMPRESSION: 1. No acute fracture or dislocation. 2. Subcutaneous soft tissue edema of the ulnar aspect of the hand.   PROCEDURES:  Critical Care performed: No   Procedures   MEDICATIONS ORDERED IN ED: Medications  ibuprofen  (ADVIL ) tablet 600 mg (600 mg Oral Given 02/21/24 1800)  acetaminophen  (TYLENOL ) tablet 650 mg (650 mg Oral Given 02/21/24 1800)     IMPRESSION / MDM / ASSESSMENT AND PLAN / ED COURSE  I reviewed the triage vital signs and the nursing notes.  Differential diagnosis includes, but is not limited to, boxer's fracture, hand or wrist sprain, abrasion, laceration  Patient's presentation is most consistent with acute complicated illness / injury requiring diagnostic workup.  Patient is a 33 year old male presenting with signs and symptoms as described above.  Patient is neurovascularly intact.  X-ray of right hand ordered in triage and is without acute fracture or dislocation. I independently viewed the x-ray and radiologist's report.  I agree with the radiologist's report there are no acute findings.  Given his punching a wall injury a couple of weeks ago and never getting evaluated, I wonder if he had a boxer's fracture that has healed.  I did give him a dose of Tylenol  and ibuprofen  in the ER.  I did discuss over-the-counter medication usage at home as well as RICE.  Provided wrist brace for support.  I will have him follow-up with orthopedics outpatient following today's visit for recheck of the swelling of his hand.  The patient may return to  the emergency department for any new, worsening, or concerning symptoms. Patient was given the opportunity to ask questions; all questions were answered. Emergency department return precautions were discussed with the patient.  Patient is in agreement to the treatment plan.  Patient is stable for discharge.   FINAL CLINICAL IMPRESSION(S) / ED DIAGNOSES   Final diagnoses:  Right hand pain     Rx / DC Orders   ED Discharge Orders     None        Note:  This document was prepared using Dragon voice recognition software and may include unintentional dictation errors.     Sheron Salm, PA-C 02/21/24 1815    Dorothyann Drivers, MD 02/21/24 1831  "

## 2024-02-21 NOTE — Discharge Instructions (Addendum)
 You were seen in the emergency department for a suspected sprain or bruising of your right hand (specifically your knuckles). This injury is typically caused after an injury to the hand and can range from mild to severe. If you had x-rays, they did not show a fracture. Recovery can takes days to weeks depending on the severity of the injury. Please read through the packet of information regarding treatment for the first 48-72 hours that is labeled RICE (Rest, Ice, Compression, Elevation). Ice is best in the first 24 hours, then warm compresses are good following that. You may purchase over-the-counter medications such as Ibuprofen  or Tylenol  (unless a doctor has told you not to take them) with dosing based on the instructions on the bottle. You may gradually resume activity as pain and swelling improve. Begin gentle range-of-motion exercises after a few days as tolerable. Wear the brace as needed for support and comfort.  Call your doctor or go to the ER or urgent care if you notice numbness or tingling, worsening swelling, develop a fever, redness, warmth, or pus-like drainage from the area or for any other new, worsening, or concerning symptoms. Follow up with the orthopedist (Dr. Lorelle) listed in this paperwork following today's visit.

## 2024-02-21 NOTE — ED Triage Notes (Signed)
 Pt endorses right hand pain and swelling for awhile that is intermittent.

## 2024-03-01 ENCOUNTER — Emergency Department
Admission: EM | Admit: 2024-03-01 | Discharge: 2024-03-02 | Disposition: A | Payer: MEDICAID | Attending: Emergency Medicine | Admitting: Emergency Medicine

## 2024-03-01 ENCOUNTER — Other Ambulatory Visit: Payer: Self-pay

## 2024-03-01 DIAGNOSIS — F209 Schizophrenia, unspecified: Secondary | ICD-10-CM | POA: Diagnosis present

## 2024-03-01 DIAGNOSIS — F1910 Other psychoactive substance abuse, uncomplicated: Secondary | ICD-10-CM | POA: Diagnosis not present

## 2024-03-01 DIAGNOSIS — R059 Cough, unspecified: Secondary | ICD-10-CM | POA: Insufficient documentation

## 2024-03-01 DIAGNOSIS — R45851 Suicidal ideations: Secondary | ICD-10-CM | POA: Insufficient documentation

## 2024-03-01 LAB — URINE DRUG SCREEN
Amphetamines: NEGATIVE
Barbiturates: NEGATIVE
Benzodiazepines: NEGATIVE
Cocaine: NEGATIVE
Fentanyl: NEGATIVE
Methadone Scn, Ur: NEGATIVE
Opiates: POSITIVE — AB
Tetrahydrocannabinol: POSITIVE — AB

## 2024-03-01 LAB — URINALYSIS, COMPLETE (UACMP) WITH MICROSCOPIC
Bacteria, UA: NONE SEEN
Bilirubin Urine: NEGATIVE
Glucose, UA: NEGATIVE mg/dL
Hgb urine dipstick: NEGATIVE
Ketones, ur: NEGATIVE mg/dL
Leukocytes,Ua: NEGATIVE
Nitrite: NEGATIVE
Protein, ur: NEGATIVE mg/dL
Specific Gravity, Urine: 1.008 (ref 1.005–1.030)
Squamous Epithelial / HPF: 0 /HPF (ref 0–5)
pH: 6 (ref 5.0–8.0)

## 2024-03-01 LAB — COMPREHENSIVE METABOLIC PANEL WITH GFR
ALT: 58 U/L — ABNORMAL HIGH (ref 0–44)
AST: 52 U/L — ABNORMAL HIGH (ref 15–41)
Albumin: 4.2 g/dL (ref 3.5–5.0)
Alkaline Phosphatase: 61 U/L (ref 38–126)
Anion gap: 13 (ref 5–15)
BUN: <5 mg/dL — ABNORMAL LOW (ref 6–20)
CO2: 25 mmol/L (ref 22–32)
Calcium: 8.8 mg/dL — ABNORMAL LOW (ref 8.9–10.3)
Chloride: 101 mmol/L (ref 98–111)
Creatinine, Ser: 0.78 mg/dL (ref 0.61–1.24)
GFR, Estimated: >60 mL/min
Glucose, Bld: 108 mg/dL — ABNORMAL HIGH (ref 70–99)
Potassium: 3.9 mmol/L (ref 3.5–5.1)
Sodium: 139 mmol/L (ref 135–145)
Total Bilirubin: 0.2 mg/dL (ref 0.0–1.2)
Total Protein: 6.2 g/dL — ABNORMAL LOW (ref 6.5–8.1)

## 2024-03-01 LAB — CBC
HCT: 45.1 % (ref 39.0–52.0)
Hemoglobin: 15.4 g/dL (ref 13.0–17.0)
MCH: 33.2 pg (ref 26.0–34.0)
MCHC: 34.1 g/dL (ref 30.0–36.0)
MCV: 97.2 fL (ref 80.0–100.0)
Platelets: 169 K/uL (ref 150–400)
RBC: 4.64 MIL/uL (ref 4.22–5.81)
RDW: 12.1 % (ref 11.5–15.5)
WBC: 7.6 K/uL (ref 4.0–10.5)
nRBC: 0 % (ref 0.0–0.2)

## 2024-03-01 LAB — ETHANOL: Alcohol, Ethyl (B): <15 mg/dL

## 2024-03-01 MED ORDER — LAMOTRIGINE 25 MG PO TABS
150.0000 mg | ORAL_TABLET | Freq: Two times a day (BID) | ORAL | Status: DC
  Administered 2024-03-01: 150 mg via ORAL
  Filled 2024-03-01: qty 2

## 2024-03-01 MED ORDER — TRAZODONE HCL 50 MG PO TABS
50.0000 mg | ORAL_TABLET | Freq: Once | ORAL | Status: AC
  Administered 2024-03-02: 50 mg via ORAL
  Filled 2024-03-01: qty 1

## 2024-03-01 MED ORDER — CARIPRAZINE HCL 1.5 MG PO CAPS: 3.0000 mg | ORAL_CAPSULE | Freq: Every day | ORAL | Status: DC

## 2024-03-01 MED ORDER — ACETAMINOPHEN 500 MG PO TABS
1000.0000 mg | ORAL_TABLET | Freq: Once | ORAL | Status: AC
  Administered 2024-03-01: 1000 mg via ORAL
  Filled 2024-03-01: qty 2

## 2024-03-01 MED ORDER — IBUPROFEN 600 MG PO TABS
600.0000 mg | ORAL_TABLET | Freq: Once | ORAL | Status: AC
  Administered 2024-03-01: 600 mg via ORAL
  Filled 2024-03-01: qty 1

## 2024-03-01 NOTE — ED Provider Notes (Signed)
 "  [facility] Provider Note    Event Date/Time   First MD Initiated Contact with Patient 03/01/24 2024     (approximate)   History   Psychiatric Evaluation   HPI  [facility]. is a 34 y.o. male who presents to the ED for evaluation of Psychiatric Evaluation   Patient with history of schizophrenia and polysubstance abuse  Patient presents to the ED because he has been feeling bad for the past 1 year.  Reports vague suicidal thoughts but he does not want to tell me the plan.  Reports he has not yet done anything to harm himself.  Asks to be left alone and covers himself with a blanket   Physical Exam   Triage Vital Signs: ED Triage Vitals  Encounter Vitals Group     BP 03/01/24 2004 (!) 140/95     Girls Systolic BP Percentile --      Girls Diastolic BP Percentile --      Boys Systolic BP Percentile --      Boys Diastolic BP Percentile --      Pulse Rate 03/01/24 2004 (!) 113     Resp 03/01/24 2004 16     Temp 03/01/24 2004 98.4 F (36.9 C)     Temp Source 03/01/24 2004 Oral     SpO2 03/01/24 2004 99 %     Weight --      Height --      Head Circumference --      Peak Flow --      Pain Score 03/01/24 2005 10     Pain Loc --      Pain Education --      Exclude from Growth Chart --     Most recent vital signs: Vitals:   03/01/24 2004  BP: (!) 140/95  Pulse: (!) 113  Resp: 16  Temp: 98.4 F (36.9 C)  SpO2: 99%    General: Awake, no distress.  CV:  Good peripheral perfusion.  Resp:  Normal effort.  Abd:  No distention.  Soft and nontender throughout MSK:  No deformity noted.  Neuro:  No focal deficits appreciated. Other:     ED Results / Procedures / Treatments   Labs (all labs ordered are listed, but only abnormal results are displayed) Labs Reviewed  COMPREHENSIVE METABOLIC PANEL WITH GFR - Abnormal; Notable for the following components:      Result Value   Glucose, Bld 108 (*)    BUN <5 (*)    Calcium 8.8 (*)     Total Protein 6.2 (*)    AST 52 (*)    ALT 58 (*)    All other components within normal limits  URINALYSIS, COMPLETE (UACMP) WITH MICROSCOPIC - Abnormal; Notable for the following components:   Color, Urine YELLOW (*)    APPearance CLEAR (*)    All other components within normal limits  ETHANOL  CBC  URINE DRUG SCREEN    EKG   RADIOLOGY   Official radiology report(s): No results found.  PROCEDURES and INTERVENTIONS:  Procedures  Medications  acetaminophen  (TYLENOL ) tablet 1,000 mg (has no administration in time range)  ibuprofen  (ADVIL ) tablet 600 mg (has no administration in time range)     IMPRESSION / MDM / ASSESSMENT AND PLAN / ED COURSE  I reviewed the triage vital signs and the nursing notes.  Differential diagnosis includes, but is not limited to, malingering, overdose, substance abuse, suicidal, sepsis  {Patient presents with symptoms of  an acute illness or injury that is potentially life-threatening.  Known schizophrenic patient with history of polysubstance abuse presents with suicidal ideations without discrete plan.  No signs of self-harm or particular toxidromes at this point.  Screening CBC is normal, UA and metabolic panel without acute pathology.  Consult psychiatry.      FINAL CLINICAL IMPRESSION(S) / ED DIAGNOSES   Final diagnoses:  None     Rx / DC Orders   ED Discharge Orders     None        Note:  This document was prepared using Dragon voice recognition software and may include unintentional dictation errors.   Claudene Rover, MD 03/01/24 2113  "

## 2024-03-01 NOTE — Consult Note (Incomplete)
 [facility] Psychiatric Consult Initial  Patient Name: .Chris Olson.  MRN: [id number]  DOB: 1990-06-20  Consult Order details:  Orders (From admission, onward)     Start     Ordered   03/01/24 2106  CONSULT TO CALL ACT TEAM       Ordering Provider: Claudene Rover, MD  Provider:  (Not yet assigned)  Question:  Reason for Consult?  Answer:  Psych consult   03/01/24 2105   03/01/24 2106  IP CONSULT TO PSYCHIATRY       Ordering Provider: Claudene Rover, MD  Provider:  (Not yet assigned)  Question:  Reason for consult:  Answer:  Medication management   03/01/24 2105             Mode of Visit: Tele-visit Virtual Statement:TELE PSYCHIATRY ATTESTATION & CONSENT As the provider for this telehealth consult, I attest that I verified the patient's identity using two separate identifiers, introduced myself to the patient, provided my credentials, disclosed my location, and performed this encounter via a HIPAA-compliant, real-time, face-to-face, two-way, interactive audio and video platform and with the full consent and agreement of the patient (or guardian as applicable.) Patient physical location: [facility]. Telehealth provider physical location: home office in state of [place] .   Video start time:   Video end time:      Psychiatry Consult Evaluation  Service Date: March 01, 2024 LOS:  LOS: 0 days  Chief Complaint SI  Primary Psychiatric Diagnoses  Schizophrenia 2.  PolySubstance  Assessment  Chris Campbell Kray. is a 34 y.o. male admitted: Presented to the EDfor 03/01/2024  8:19 PM for worsening suicidal ideation, psychotic symptoms, and impaired judgment.   His current presentation of persistent suicidal ideation with past attempts, active paranoid ideation, hallucinations, homicidal ideation, substance use, and medication nonadherence is most consistent with an acute psychotic and depressive decompensation. He meets criteria for inpatient psychiatric  admission based on active suicidal ideation with past attempts, endorsement of homicidal ideation, ongoing psychotic symptoms, impaired reality testing, substance use, and inability to ensure safety.  Current outpatient psychotropic medications include cariprazine  (Vraylar ), hydroxyzine , and lamotrigine , with a historically partial response. He was noncompliant with medications prior to admission as evidenced by patient report of inconsistent use due to perceived side effects. On initial examination, patient was calm and cooperative but demonstrated depressed mood, paranoia, hallucinations by history, impaired insight, and impaired judgment.  Diagnoses:  Active Hospital problems: Active Problems:   * No active hospital problems. *    Plan   ## Psychiatric Medication Recommendations:  Trazodone  50mg  at bedtime, Continue home medication per [facility].  ## Medical Decision Making Capacity: Not specifically addressed in this encounter  ## Disposition:-- We recommend inpatient psychiatric hospitalization when medically cleared. Patient is under voluntary admission status at this time; please IVC if attempts to leave hospital.  ## Behavioral / Environmental: - No specific recommendations at this time.     ## Safety and Observation Level:  - Based on my clinical evaluation, I estimate the patient to be at low risk of self harm in the current setting. - At this time, we recommend  routine. This decision is based on my review of the chart including patient's history and current presentation, interview of the patient, mental status examination, and consideration of suicide risk including evaluating suicidal ideation, plan, intent, suicidal or self-harm behaviors, risk factors, and protective factors. This judgment is based on our ability to directly address suicide risk, implement suicide prevention strategies, and  develop a safety plan while the patient is in the clinical setting. Please contact our team if  there is a concern that risk level has changed.  CSSR Risk Category:C-SSRS RISK CATEGORY: No Risk  Suicide Risk Assessment: Patient has following modifiable risk factors for suicide: medication noncompliance, which we are addressing by inpatient admission. Patient has following non-modifiable or demographic risk factors for suicide: male gender Patient has the following protective factors against suicide: Access to outpatient mental health care  Thank you for this consult request. Recommendations have been communicated to the primary team.  We will recommend inpatient admission at this time.   Chris Summer, NP       History of Present Illness  Relevant Aspects of Hospital ED Course:  Admitted on 03/01/2024 for worsening suicidal ideation.   Patient Report:  A 34 y.o. male admitted to the emergency department for suicidal ideation with psychotic symptoms. Patient reports ongoing suicidal thoughts for approximately three weeks, stating, Im tired of feeling like I dont matter and that he does not look forward to anything in life. While he denies an immediate plan at the time of assessment, he endorses recurrent suicidal ideation with past planning, stating his first thought is hanging. Patient reports a prior suicide attempt by overdose using lithium pills, though he no longer takes lithium.  Patient endorses homicidal ideation toward family members, stating he feels they have turned their back on him. He endorses visual hallucinations, describing seeing shadows and figures, and reports intermittent auditory hallucinations, though none today. He reports significant paranoia, stating he frequently feels watched and believes others are talking about him. Patient reports feeling confused and unsure whether his thoughts are right or wrong.  Patient has a known diagnosis of schizophrenia and reports receiving outpatient services through [facility]. He reports current medications as cariprazine   (Vraylar ), hydroxyzine , and lamotrigine  but admits poor adherence due to feeling foggy when taking them. Patient lives in a rooming house, is on disability, and reports being able to care for himself. He reports legal issues with an upcoming court date in February related to possession of marijuana. Patient endorses daily marijuana use, alcohol use earlier today (typically beer or wine), and cocaine use approximately one week ago. During assessment, patient was calm and cooperative.  Psych ROS:  Depression: yes Anxiety:  yes Mania (lifetime and current): no Psychosis: (lifetime and current): yes  Review of Systems  Constitutional:  Positive for malaise/fatigue.  HENT: Negative.    Eyes: Negative.   Respiratory:  Positive for cough.   Cardiovascular: Negative.   Gastrointestinal: Negative.   Genitourinary: Negative.   Musculoskeletal: Negative.   Skin: Negative.   Neurological: Negative.   Psychiatric/Behavioral:  Positive for depression, hallucinations, substance abuse and suicidal ideas. The patient has insomnia.      Psychiatric and Social History  Psychiatric History:  Information collected from Patient and chart history  Prev Dx/Sx: Schizophrenia Current Psych Provider: [facility] (current): Vraylar , hydroxyzine , and lamotrigine  Previous Med Trials: Lithium Therapy: Yes  Prior Psych Hospitalization: Yes Prior Self Harm: Yes Prior Violence: None reported  Family Psych History: None reported Family Hx suicide: None reported  Social History:  Developmental Hx: Unknown Educational Hx: Unknown Occupational Hx: Unknown Legal Hx: Yes Living Situation: Boarding room Spiritual Hx: Unknown Access to weapons/lethal means: no   Substance History Alcohol: Yes Type of alcohol beer and wine Last Drink today Number of drinks per day  2 History of alcohol withdrawal seizures none reported History of DT's none  reported Tobacco: Denies Illicit drugs:  Cocaine Prescription drug abuse: Denies Rehab hx: Denies  Exam Findings   Vital Signs:  Temp:  [98.4 F (36.9 C)] 98.4 F (36.9 C) (01/04 2004) Pulse Rate:  [113] 113 (01/04 2004) Resp:  [16] 16 (01/04 2004) BP: (140)/(95) 140/95 (01/04 2004) SpO2:  [99 %] 99 % (01/04 2004) Blood pressure (!) 140/95, pulse (!) 113, temperature 98.4 F (36.9 C), temperature source Oral, resp. rate 16, SpO2 99%. There is no height or weight on file to calculate BMI.  Physical Exam HENT:     Head: Normocephalic.     Nose: Congestion and rhinorrhea present.     Mouth/Throat:     Mouth: Mucous membranes are moist.  Eyes:     Extraocular Movements: Extraocular movements intact.  Pulmonary:     Effort: Pulmonary effort is normal.  Musculoskeletal:        General: Normal range of motion.     Cervical back: Normal range of motion.  Skin:    General: Skin is dry.  Neurological:     Mental Status: He is alert.     Other History   These have been pulled in through the EMR, reviewed, and updated if appropriate.  Family History:  The patient's family history is not on file.  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 No pertinent surgical history.   Medications:  Current Medications[1]  Allergies: Allergies[2]  Romayne Ticas, NP      [1]  Current Facility-Administered Medications:    acetaminophen  (TYLENOL ) tablet 1,000 mg, 1,000 mg, Oral, Once, Claudene Rover, MD   ibuprofen  (ADVIL ) tablet 600 mg, 600 mg, Oral, Once, Claudene Rover, MD  Current Outpatient Medications:    cariprazine  (VRAYLAR ) 3 MG capsule, Take 3 mg by mouth daily., Disp: , Rfl:    hydrOXYzine  (ATARAX /VISTARIL ) 25 MG tablet, Take 1 tablet (25 mg total) by mouth every 6 (six) hours as needed  for anxiety., Disp: 75 tablet, Rfl: 0   lamoTRIgine  (LAMICTAL ) 150 MG tablet, Take 150 mg by mouth 2 (two) times daily., Disp: , Rfl:    melatonin 5 MG TABS, Take 1 tablet (5 mg total) by mouth at bedtime., Disp: 30 tablet, Rfl: 0   nicotine  polacrilex (NICORETTE ) 2 MG gum, Take 1 each (2 mg total) by mouth as needed for smoking cessation., Disp: 100 tablet, Rfl: 0   nystatin -triamcinolone  ointment (MYCOLOG), Apply 1 Application topically 2 (two) times daily., Disp: 30 g, Rfl: 0   omeprazole  (PRILOSEC) 20 MG capsule, Take 1 capsule (20 mg total) by mouth daily., Disp: 30 capsule, Rfl: 0   polyethylene glycol (MIRALAX  / GLYCOLAX ) 17 g packet, Take 17 g by mouth daily., Disp: 14 each, Rfl: 0 [2] No Known Allergies

## 2024-03-01 NOTE — Consult Note (Signed)
 [facility] Psychiatric Consult Initial  Patient Name: .Chris Olson.  MRN: [id number]  DOB: 1990-06-20  Consult Order details:  Orders (From admission, onward)     Start     Ordered   03/01/24 2106  CONSULT TO CALL ACT TEAM       Ordering Provider: Claudene Rover, MD  Provider:  (Not yet assigned)  Question:  Reason for Consult?  Answer:  Psych consult   03/01/24 2105   03/01/24 2106  IP CONSULT TO PSYCHIATRY       Ordering Provider: Claudene Rover, MD  Provider:  (Not yet assigned)  Question:  Reason for consult:  Answer:  Medication management   03/01/24 2105             Mode of Visit: Tele-visit Virtual Statement:TELE PSYCHIATRY ATTESTATION & CONSENT As the provider for this telehealth consult, I attest that I verified the patient's identity using two separate identifiers, introduced myself to the patient, provided my credentials, disclosed my location, and performed this encounter via a HIPAA-compliant, real-time, face-to-face, two-way, interactive audio and video platform and with the full consent and agreement of the patient (or guardian as applicable.) Patient physical location: [facility]. Telehealth provider physical location: home office in state of [place] .   Video start time:   Video end time:      Psychiatry Consult Evaluation  Service Date: March 01, 2024 LOS:  LOS: 0 days  Chief Complaint SI  Primary Psychiatric Diagnoses  Schizophrenia 2.  PolySubstance  Assessment  Chris Olson. is a 34 y.o. male admitted: Presented to the EDfor 03/01/2024  8:19 PM for worsening suicidal ideation, psychotic symptoms, and impaired judgment.   His current presentation of persistent suicidal ideation with past attempts, active paranoid ideation, hallucinations, homicidal ideation, substance use, and medication nonadherence is most consistent with an acute psychotic and depressive decompensation. He meets criteria for inpatient psychiatric  admission based on active suicidal ideation with past attempts, endorsement of homicidal ideation, ongoing psychotic symptoms, impaired reality testing, substance use, and inability to ensure safety.  Current outpatient psychotropic medications include cariprazine  (Vraylar ), hydroxyzine , and lamotrigine , with a historically partial response. He was noncompliant with medications prior to admission as evidenced by patient report of inconsistent use due to perceived side effects. On initial examination, patient was calm and cooperative but demonstrated depressed mood, paranoia, hallucinations by history, impaired insight, and impaired judgment.  Diagnoses:  Active Hospital problems: Active Problems:   * No active hospital problems. *    Plan   ## Psychiatric Medication Recommendations:  Trazodone  50mg  at bedtime, Continue home medication per [facility].  ## Medical Decision Making Capacity: Not specifically addressed in this encounter  ## Disposition:-- We recommend inpatient psychiatric hospitalization when medically cleared. Patient is under voluntary admission status at this time; please IVC if attempts to leave hospital.  ## Behavioral / Environmental: - No specific recommendations at this time.     ## Safety and Observation Level:  - Based on my clinical evaluation, I estimate the patient to be at low risk of self harm in the current setting. - At this time, we recommend  routine. This decision is based on my review of the chart including patient's history and current presentation, interview of the patient, mental status examination, and consideration of suicide risk including evaluating suicidal ideation, plan, intent, suicidal or self-harm behaviors, risk factors, and protective factors. This judgment is based on our ability to directly address suicide risk, implement suicide prevention strategies, and  develop a safety plan while the patient is in the clinical setting. Please contact our team if  there is a concern that risk level has changed.  CSSR Risk Category:C-SSRS RISK CATEGORY: No Risk  Suicide Risk Assessment: Patient has following modifiable risk factors for suicide: medication noncompliance, which we are addressing by inpatient admission. Patient has following non-modifiable or demographic risk factors for suicide: male gender Patient has the following protective factors against suicide: Access to outpatient mental health care  Thank you for this consult request. Recommendations have been communicated to the primary team.  We will recommend inpatient admission at this time.   Chris Summer, NP       History of Present Illness  Relevant Aspects of Hospital ED Course:  Admitted on 03/01/2024 for worsening suicidal ideation.   Patient Report:  A 34 y.o. male admitted to the emergency department for suicidal ideation with psychotic symptoms. Patient reports ongoing suicidal thoughts for approximately three weeks, stating, Im tired of feeling like I dont matter and that he does not look forward to anything in life. While he denies an immediate plan at the time of assessment, he endorses recurrent suicidal ideation with past planning, stating his first thought is hanging. Patient reports a prior suicide attempt by overdose using lithium pills, though he no longer takes lithium.  Patient endorses homicidal ideation toward family members, stating he feels they have turned their back on him. He endorses visual hallucinations, describing seeing shadows and figures, and reports intermittent auditory hallucinations, though none today. He reports significant paranoia, stating he frequently feels watched and believes others are talking about him. Patient reports feeling confused and unsure whether his thoughts are right or wrong.  Patient has a known diagnosis of schizophrenia and reports receiving outpatient services through [facility]. He reports current medications as cariprazine   (Vraylar ), hydroxyzine , and lamotrigine  but admits poor adherence due to feeling foggy when taking them. Patient lives in a rooming house, is on disability, and reports being able to care for himself. He reports legal issues with an upcoming court date in February related to possession of marijuana. Patient endorses daily marijuana use, alcohol use earlier today (typically beer or wine), and cocaine use approximately one week ago. During assessment, patient was calm and cooperative.  Psych ROS:  Depression: yes Anxiety:  yes Mania (lifetime and current): no Psychosis: (lifetime and current): yes  Review of Systems  Constitutional:  Positive for malaise/fatigue.  HENT: Negative.    Eyes: Negative.   Respiratory:  Positive for cough.   Cardiovascular: Negative.   Gastrointestinal: Negative.   Genitourinary: Negative.   Musculoskeletal: Negative.   Skin: Negative.   Neurological: Negative.   Psychiatric/Behavioral:  Positive for depression, hallucinations, substance abuse and suicidal ideas. The patient has insomnia.      Psychiatric and Social History  Psychiatric History:  Information collected from Patient and chart history  Prev Dx/Sx: Schizophrenia Current Psych Provider: [facility] (current): Vraylar , hydroxyzine , and lamotrigine  Previous Med Trials: Lithium Therapy: Yes  Prior Psych Hospitalization: Yes Prior Self Harm: Yes Prior Violence: None reported  Family Psych History: None reported Family Hx suicide: None reported  Social History:  Developmental Hx: Unknown Educational Hx: Unknown Occupational Hx: Unknown Legal Hx: Yes Living Situation: Boarding room Spiritual Hx: Unknown Access to weapons/lethal means: no   Substance History Alcohol: Yes Type of alcohol beer and wine Last Drink today Number of drinks per day  2 History of alcohol withdrawal seizures none reported History of DT's none  reported Tobacco: Denies Illicit drugs:  Cocaine Prescription drug abuse: Denies Rehab hx: Denies  Exam Findings   Vital Signs:  Temp:  [98.4 F (36.9 C)] 98.4 F (36.9 C) (01/04 2004) Pulse Rate:  [113] 113 (01/04 2004) Resp:  [16] 16 (01/04 2004) BP: (140)/(95) 140/95 (01/04 2004) SpO2:  [99 %] 99 % (01/04 2004) Blood pressure (!) 140/95, pulse (!) 113, temperature 98.4 F (36.9 C), temperature source Oral, resp. rate 16, SpO2 99%. There is no height or weight on file to calculate BMI.  Physical Exam HENT:     Head: Normocephalic.     Nose: Congestion and rhinorrhea present.     Mouth/Throat:     Mouth: Mucous membranes are moist.  Eyes:     Extraocular Movements: Extraocular movements intact.  Pulmonary:     Effort: Pulmonary effort is normal.  Musculoskeletal:        General: Normal range of motion.     Cervical back: Normal range of motion.  Skin:    General: Skin is dry.  Neurological:     Mental Status: He is alert.     Other History   These have been pulled in through the EMR, reviewed, and updated if appropriate.  Family History:  The patient's family history is not on file.  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 No pertinent surgical history.   Medications:  Current Medications[1]  Allergies: Allergies[2]  Romayne Ticas, NP      [1]  Current Facility-Administered Medications:    acetaminophen  (TYLENOL ) tablet 1,000 mg, 1,000 mg, Oral, Once, Claudene Rover, MD   ibuprofen  (ADVIL ) tablet 600 mg, 600 mg, Oral, Once, Claudene Rover, MD  Current Outpatient Medications:    cariprazine  (VRAYLAR ) 3 MG capsule, Take 3 mg by mouth daily., Disp: , Rfl:    hydrOXYzine  (ATARAX /VISTARIL ) 25 MG tablet, Take 1 tablet (25 mg total) by mouth every 6 (six) hours as needed  for anxiety., Disp: 75 tablet, Rfl: 0   lamoTRIgine  (LAMICTAL ) 150 MG tablet, Take 150 mg by mouth 2 (two) times daily., Disp: , Rfl:    melatonin 5 MG TABS, Take 1 tablet (5 mg total) by mouth at bedtime., Disp: 30 tablet, Rfl: 0   nicotine  polacrilex (NICORETTE ) 2 MG gum, Take 1 each (2 mg total) by mouth as needed for smoking cessation., Disp: 100 tablet, Rfl: 0   nystatin -triamcinolone  ointment (MYCOLOG), Apply 1 Application topically 2 (two) times daily., Disp: 30 g, Rfl: 0   omeprazole  (PRILOSEC) 20 MG capsule, Take 1 capsule (20 mg total) by mouth daily., Disp: 30 capsule, Rfl: 0   polyethylene glycol (MIRALAX  / GLYCOLAX ) 17 g packet, Take 17 g by mouth daily., Disp: 14 each, Rfl: 0 [2] No Known Allergies

## 2024-03-01 NOTE — ED Notes (Signed)
 Patient reports symptoms for approximately a year - generalized weakness and abdominal pain.  Patient also reports having suicidal thoughts with no plan at this time.  Patient is very withdrawn with poor eye contact.

## 2024-03-01 NOTE — ED Notes (Signed)
 Patient speaking with TTS and telepsych provider.
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
## 2024-03-01 NOTE — ED Triage Notes (Signed)
 Pt presents via POV c/o body aches, generalized weakness, and cough, reports symptoms have been ongoing for a year. Also reports he is suicidal. Reports I'm tired of feeling like I dont matter. Reports have no plan to hurt himself at current moment but reports has had plans in the past.

## 2024-03-02 ENCOUNTER — Encounter: Payer: Self-pay | Admitting: Psychiatry

## 2024-03-02 ENCOUNTER — Inpatient Hospital Stay
Admission: AD | Admit: 2024-03-02 | Discharge: 2024-03-07 | DRG: 885 | Disposition: A | Discharge status: Home / Self Care | Payer: MEDICAID | Source: Intra-hospital | Attending: Psychiatry | Admitting: Psychiatry

## 2024-03-02 ENCOUNTER — Other Ambulatory Visit: Payer: Self-pay

## 2024-03-02 DIAGNOSIS — F17210 Nicotine dependence, cigarettes, uncomplicated: Secondary | ICD-10-CM | POA: Diagnosis present

## 2024-03-02 DIAGNOSIS — Z630 Problems in relationship with spouse or partner: Secondary | ICD-10-CM | POA: Diagnosis not present

## 2024-03-02 DIAGNOSIS — Z9152 Personal history of nonsuicidal self-harm: Secondary | ICD-10-CM

## 2024-03-02 DIAGNOSIS — G479 Sleep disorder, unspecified: Secondary | ICD-10-CM | POA: Diagnosis present

## 2024-03-02 DIAGNOSIS — F819 Developmental disorder of scholastic skills, unspecified: Secondary | ICD-10-CM | POA: Diagnosis present

## 2024-03-02 DIAGNOSIS — Z5982 Transportation insecurity: Secondary | ICD-10-CM

## 2024-03-02 DIAGNOSIS — Z79899 Other long term (current) drug therapy: Secondary | ICD-10-CM | POA: Diagnosis not present

## 2024-03-02 DIAGNOSIS — R531 Weakness: Secondary | ICD-10-CM | POA: Diagnosis present

## 2024-03-02 DIAGNOSIS — F332 Major depressive disorder, recurrent severe without psychotic features: Secondary | ICD-10-CM | POA: Diagnosis present

## 2024-03-02 DIAGNOSIS — F209 Schizophrenia, unspecified: Secondary | ICD-10-CM | POA: Diagnosis present

## 2024-03-02 DIAGNOSIS — F419 Anxiety disorder, unspecified: Secondary | ICD-10-CM | POA: Diagnosis present

## 2024-03-02 LAB — RESP PANEL BY RT-PCR (RSV, FLU A&B, COVID)  RVPGX2
Influenza A by PCR: NEGATIVE
Influenza B by PCR: NEGATIVE
Resp Syncytial Virus by PCR: NEGATIVE
SARS Coronavirus 2 by RT PCR: NEGATIVE

## 2024-03-02 MED ORDER — CARIPRAZINE HCL 1.5 MG PO CAPS
3.0000 mg | ORAL_CAPSULE | Freq: Every day | ORAL | Status: DC
  Administered 2024-03-02 – 2024-03-07 (×6): 3 mg via ORAL
  Filled 2024-03-02 (×6): qty 2

## 2024-03-02 MED ORDER — DIPHENHYDRAMINE HCL 25 MG PO CAPS: 50.0000 mg | ORAL_CAPSULE | Freq: Three times a day (TID) | ORAL | Status: DC | PRN

## 2024-03-02 MED ORDER — BISACODYL 5 MG PO TBEC: 10.0000 mg | DELAYED_RELEASE_TABLET | Freq: Every day | ORAL | Status: DC | PRN

## 2024-03-02 MED ORDER — DIPHENHYDRAMINE HCL 50 MG/ML IJ SOLN: 50.0000 mg | Freq: Three times a day (TID) | INTRAMUSCULAR | Status: DC | PRN

## 2024-03-02 MED ORDER — MAGNESIUM HYDROXIDE 400 MG/5ML PO SUSP: 30.0000 mL | Freq: Every day | ORAL | Status: DC | PRN

## 2024-03-02 MED ORDER — ACETAMINOPHEN 325 MG PO TABS: 650.0000 mg | ORAL_TABLET | Freq: Four times a day (QID) | ORAL | Status: DC | PRN

## 2024-03-02 MED ORDER — HALOPERIDOL LACTATE 5 MG/ML IJ SOLN: 10.0000 mg | Freq: Three times a day (TID) | INTRAMUSCULAR | Status: DC | PRN

## 2024-03-02 MED ORDER — TRAZODONE HCL 50 MG PO TABS
50.0000 mg | ORAL_TABLET | Freq: Every evening | ORAL | Status: DC | PRN
  Filled 2024-03-02: qty 1

## 2024-03-02 MED ORDER — ALUM & MAG HYDROXIDE-SIMETH 200-200-20 MG/5ML PO SUSP: 30.0000 mL | ORAL | Status: DC | PRN

## 2024-03-02 MED ORDER — LORAZEPAM 2 MG/ML IJ SOLN: 2.0000 mg | Freq: Three times a day (TID) | INTRAMUSCULAR | Status: DC | PRN

## 2024-03-02 MED ORDER — LAMOTRIGINE 25 MG PO TABS
150.0000 mg | ORAL_TABLET | Freq: Two times a day (BID) | ORAL | Status: DC
  Administered 2024-03-02 – 2024-03-07 (×11): 150 mg via ORAL
  Filled 2024-03-02 (×11): qty 2

## 2024-03-02 MED ORDER — HYDROXYZINE HCL 25 MG PO TABS
25.0000 mg | ORAL_TABLET | Freq: Three times a day (TID) | ORAL | Status: DC | PRN
  Administered 2024-03-02 – 2024-03-06 (×3): 25 mg via ORAL
  Filled 2024-03-02 (×3): qty 1

## 2024-03-02 MED ORDER — HALOPERIDOL LACTATE 5 MG/ML IJ SOLN: 5.0000 mg | Freq: Three times a day (TID) | INTRAMUSCULAR | Status: DC | PRN

## 2024-03-02 MED ORDER — HALOPERIDOL 5 MG PO TABS: 5.0000 mg | ORAL_TABLET | Freq: Three times a day (TID) | ORAL | Status: DC | PRN

## 2024-03-02 NOTE — Group Note (Signed)
 LCSW Group Therapy Note  Group Date: 03/02/2024 Start Time: 1315 End Time: 1400   Type of Therapy and Topic:  Group Therapy: Anger Cues and Responses  Participation Level:  Did Not Attend   Description of Group:   In this group, patients learned how to recognize the physical, cognitive, emotional, and behavioral responses they have to anger-provoking situations.  They identified a recent time they became angry and how they reacted.  They analyzed how their reaction was possibly beneficial and how it was possibly unhelpful.  The group discussed a variety of healthier coping skills that could help with such a situation in the future.  Focus was placed on how helpful it is to recognize the underlying emotions to our anger, because working on those can lead to a more permanent solution as well as our ability to focus on the important rather than the urgent.  Therapeutic Goals: Patients will remember their last incident of anger and how they felt emotionally and physically, what their thoughts were at the time, and how they behaved. Patients will identify how their behavior at that time worked for them, as well as how it worked against them. Patients will explore possible new behaviors to use in future anger situations. Patients will learn that anger itself is normal and cannot be eliminated, and that healthier reactions can assist with resolving conflict rather than worsening situations.  Summary of Patient Progress:   X  Therapeutic Modalities:   Cognitive Behavioral Therapy    Sherryle JINNY Margo, LCSW 03/02/2024  2:36 PM

## 2024-03-02 NOTE — Plan of Care (Signed)
 Patient admitted to the unit today and has not had time to progress.  Problem: Education: Goal: Knowledge of [facility] Education information/materials will improve Outcome: Not Progressing Goal: Emotional status will improve Outcome: Not Progressing Goal: Mental status will improve Outcome: Not Progressing Goal: Verbalization of understanding the information provided will improve Outcome: Not Progressing   Problem: Activity: Goal: Interest or engagement in activities will improve Outcome: Not Progressing Goal: Sleeping patterns will improve Outcome: Not Progressing   Problem: Coping: Goal: Ability to verbalize frustrations and anger appropriately will improve Outcome: Not Progressing Goal: Ability to demonstrate self-control will improve Outcome: Not Progressing   Problem: Health Behavior/Discharge Planning: Goal: Identification of resources available to assist in meeting health care needs will improve Outcome: Not Progressing Goal: Compliance with treatment plan for underlying cause of condition will improve Outcome: Not Progressing   Problem: Physical Regulation: Goal: Ability to maintain clinical measurements within normal limits will improve Outcome: Not Progressing   Problem: Safety: Goal: Periods of time without injury will increase Outcome: Not Progressing

## 2024-03-02 NOTE — BH Assessment (Signed)
 Patient is to be admitted to [facility] by Psychiatric Nurse Practitioner Jon HERO.  Attending Physician will be Dr. Jadapalle.   Patient has been assigned to room 313, by [facility] Charge Nurse Bukola.   Intake Paper Work has been signed and placed on patient chart.  ER staff is aware of the admission: Jerel, ER Secretary   Dr. Neomi, ER MD  [facility] Patient's Nurse   Patient accepted to BMU 313 and can arrive at 0300.

## 2024-03-02 NOTE — ED Provider Notes (Signed)
 Accepted to [facility]U for psych inpatient treatment.  The patient has been placed in psychiatric observation due to the need to provide a safe environment for the patient while obtaining psychiatric consultation and evaluation, as well as ongoing medical and medication management to treat the patient's condition.  The patient has not been placed under full IVC at this time.    Chris Olson, Josette SAILOR, DO 03/02/24 [phone]

## 2024-03-02 NOTE — Group Note (Deleted)
 Date:  03/02/2024 Time:  12:45 PM  Group Topic/Focus:  Building Self Esteem:   The Focus of this group is helping patients become aware of the effects of self-esteem on their lives, the things they and others do that enhance or undermine their self-esteem, seeing the relationship between their level of self-esteem and the choices they make and learning ways to enhance self-esteem.     Participation Level:  {BHH PARTICIPATION OZCZO:77735}  Participation Quality:  {BHH PARTICIPATION QUALITY:22265}  Affect:  {BHH AFFECT:22266}  Cognitive:  {BHH COGNITIVE:22267}  Insight: {BHH Insight2:20797}  Engagement in Group:  {BHH ENGAGEMENT IN HMNLE:77731}  Modes of Intervention:  {BHH MODES OF INTERVENTION:22269}  Additional Comments:  ***  Chris Olson 03/02/2024, 12:45 PM

## 2024-03-02 NOTE — Plan of Care (Signed)
   Problem: Education: Goal: Knowledge of [facility] Education information/materials will improve Outcome: Progressing   Problem: Education: Goal: Emotional status will improve Outcome: Progressing   Problem: Education: Goal: Mental status will improve Outcome: Progressing   Problem: Education: Goal: Verbalization of understanding the information provided will improve Outcome: Progressing
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
## 2024-03-02 NOTE — Group Note (Signed)
 Recreation Therapy Group Note   Group Topic:Coping Skills  Group Date: 03/02/2024 Start Time: 1050 End Time: 1135 Facilitators: Celestia Jeoffrey BRAVO, LRT, CTRS Location: Craft Room  Group Description: Mind Map.  Patient was provided a blank template of a diagram with 32 blank boxes in a tiered system, branching from the center (similar to a bubble chart). LRT directed patients to label the middle of the diagram Coping Skills. LRT and patients then came up with 8 different coping skills as examples. Pt were directed to record their coping skills in the 2nd tier boxes closest to the center.  Patients would then share their coping skills with the group as LRT wrote them out. LRT gave a handout of 99 different coping skills at the end of group.   Goal Area(s) Addressed: Patients will be able to define coping skills. Patient will identify new coping skills.  Patient will increase communication.   Affect/Mood: N/A   Participation Level: Did not attend    Clinical Observations/Individualized Feedback: Patient did not attend.  Plan: Continue to engage patient in RT group sessions 2-3x/week.   Jeoffrey BRAVO Celestia, LRT, CTRS 03/02/2024 12:24 PM

## 2024-03-02 NOTE — [facility] Suicide Risk Assessment (Signed)
 [facility] Admission Suicide Risk Assessment   Nursing information obtained from:  Patient Demographic factors:  Male Current Mental Status:  Self-harm thoughts Loss Factors:  Financial problems / change in socioeconomic status, Decrease in vocational status Historical Factors:  Impulsivity Risk Reduction Factors:  Religious beliefs about death, Living with another person, especially a relative, Positive coping skills or problem solving skills  Total Time spent with patient: 30 minutes Principal Problem: <principal problem not specified> Diagnosis:  Active Problems:   MDD (major depressive disorder), recurrent episode, severe (HCC)  Subjective Data: Chris Olson. is a 34 year old, English speaking, Black male. Pt presented to [facility] ED voluntarily. Per triage note: Pt presents via POV c/o body aches, generalized weakness, and cough, reports symptoms have been ongoing for a year. Also reports he is suicidal. Reports I'm tired of feeling like I don't matter. Reports have no plan to hurt himself at current moment but reports has had plans in the past.  On assessment, the patient was visibly depressed. Pt was forthcoming when asked assessment questions. With encouragement, pt. began explaining that he has ongoing relationship issues. Pt identified having multiple life stressors including worsening thoughts of SI, paranoia, and feelings of hopelessness. Pt reported that he often questions what's the point of him being here. Pt reported having chronic depression and issues with sleep disturbance, explaining that he can't shut his mind off.Patient is admitted to adult psych unit with Q15 min safety monitoring. Multidisciplinary team approach is offered. Medication management; group/milieu therapy is offered.   Continued Clinical Symptoms:  Alcohol Use Disorder Identification Test Final Score (AUDIT): 17 The Alcohol Use Disorders Identification Test, Guidelines for Use in Primary Care, Second Edition.   World [employer] Writer [facility]). Score between 0-7:  no or low risk or alcohol related problems. Score between 8-15:  moderate risk of alcohol related problems. Score between 16-19:  high risk of alcohol related problems. Score 20 or above:  warrants further diagnostic evaluation for alcohol dependence and treatment.   CLINICAL FACTORS:   Depression:   Impulsivity   Musculoskeletal: Strength & Muscle Tone: within normal limits Gait & Station: normal Patient leans: N/A  Psychiatric Specialty Exam:  Presentation  General Appearance:  Appropriate for Environment  Eye Contact: Fair  Speech: Clear and Coherent  Speech Volume: Normal  Handedness: Right   Mood and Affect  Mood: Depressed; Dysphoric  Affect: Tearful; Congruent; Depressed; Flat   Thought Process  Thought Processes: Coherent  Descriptions of Associations:Intact  Orientation:Full (Time, Place and Person)  Thought Content:Logical  History of Schizophrenia/Schizoaffective disorder:Yes  Duration of Psychotic Symptoms:No data recorded Hallucinations:Hallucinations: None  Ideas of Reference:None  Suicidal Thoughts:Suicidal Thoughts: Yes, Passive SI Passive Intent and/or Plan: Without Intent  Homicidal Thoughts:Homicidal Thoughts: No   Sensorium  Memory: Immediate Fair  Judgment: Impaired  Insight: Lacking   Executive Functions  Concentration: Fair  Attention Span: Fair  Recall: Fair  Fund of Knowledge: Poor  Language: Fair   Psychomotor Activity  Psychomotor Activity: Psychomotor Activity: Normal   Assets  Assets: Communication Skills; Desire for Improvement; Resilience   Sleep  Sleep:No data recorded   Physical Exam: Physical Exam ROS Blood pressure 126/85, pulse 97, temperature 98.1 F (36.7 C), temperature source Oral, resp. rate 16, height 5' 8 (1.727 m), weight 61.7 kg, SpO2 99%. Body mass index is 20.68 kg/m.   COGNITIVE FEATURES THAT  CONTRIBUTE TO RISK:  None    SUICIDE RISK:   Minimal: No identifiable suicidal ideation.  Patients presenting with no risk factors  but with morbid ruminations; may be classified as minimal risk based on the severity of the depressive symptoms  PLAN OF CARE: Patient is admitted to adult psych unit with Q15 min safety monitoring. Multidisciplinary team approach is offered. Medication management; group/milieu therapy is offered.   I certify that inpatient services furnished can reasonably be expected to improve the patient's condition.   Laurena Valko, MD 03/02/2024, 5:00 PM

## 2024-03-02 NOTE — Group Note (Signed)
 Date:  03/02/2024 Time:  8:52 PM  Group Topic/Focus:  Wrap-Up Group:   The focus of this group is to help patients review their daily goal of treatment and discuss progress on daily workbooks.    Participation Level:  Did Not Attend    Arlester CHRISTELLA Servant 03/02/2024, 8:52 PM

## 2024-03-02 NOTE — Tx Team (Signed)
 Initial Treatment Plan 03/02/2024 4:48 AM Chris Olson. FMW:[id number]    PATIENT STRESSORS: Financial difficulties   Medication change or noncompliance   Substance abuse     PATIENT STRENGTHS: Ability for insight  Printmaker for treatment/growth    PATIENT IDENTIFIED PROBLEMS: Schizophrenia     Polysubstance                  DISCHARGE CRITERIA:  Improved stabilization in mood, thinking, and/or behavior Motivation to continue treatment in a less acute level of care  PRELIMINARY DISCHARGE PLAN: Outpatient therapy  PATIENT/FAMILY INVOLVEMENT: This treatment plan has been presented to and reviewed with the patient, [name],  The patient and family have been given the opportunity to ask questions and make suggestions.  Katrell Milhorn Abisola Charman Blasco, RN 03/02/2024, 4:48 AM

## 2024-03-02 NOTE — Group Note (Signed)
 Recreation Therapy Group Note   Group Topic:General Recreation  Group Date: 03/02/2024 Start Time: 1530 End Time: 1615 Facilitators: Celestia Jeoffrey BRAVO, LRT, CTRS Location: Courtyard  Group Description: [employer]. LRT and patients played games of basketball, drew with chalk, and played corn hole while outside in the courtyard while getting fresh air and sunlight. Music was being played in the background. LRT and peers conversed about different games they have played before, what they do in their free time and anything else that is on their minds. LRT encouraged pts to drink water after being outside, sweating and getting their heart rate up.  Goal Area(s) Addressed: Patient will build on frustration tolerance skills. Patients will partake in a competitive play game with peers. Patients will gain knowledge of new leisure interest/hobby.    Affect/Mood: Appropriate   Participation Level: Moderate    Clinical Observations/Individualized Feedback: Chris Olson came late to group. Pt was present for the second half of the session.   Plan: Continue to engage patient in RT group sessions 2-3x/week.   Jeoffrey BRAVO Celestia, LRT, CTRS 03/02/2024 5:00 PM

## 2024-03-02 NOTE — Progress Notes (Signed)
" °   03/02/24 1230  Psych Admission Type (Psych Patients Only)  Admission Status Voluntary  Psychosocial Assessment  Patient Complaints Anxiety;Depression;Sleep disturbance;Crying spells (patient stated I just don't like being alone, that's when I get to thinking; sleep disturbance due to being admitted to the unit during early morning hours.)  Eye Contact Fair  Facial Expression Sad  Affect Sad  Speech Logical/coherent;Soft  Interaction Assertive  Motor Activity Slow  Appearance/Hygiene Layered clothes;In scrubs;Disheveled  Behavior Characteristics Cooperative;Appropriate to situation  Mood Sad;Pleasant  Aggressive Behavior  Effect No apparent injury  Thought Process  Coherency WDL  Content WDL  Delusions None reported or observed  Perception WDL  Hallucination None reported or observed  Judgment WDL  Confusion None  Danger to Self  Current suicidal ideation? Denies  Self-Injurious Behavior No self-injurious ideation or behavior indicators observed or expressed   Agreement Not to Harm Self Yes  Description of Agreement Verbal  Danger to Others  Danger to Others None reported or observed    "

## 2024-03-02 NOTE — Progress Notes (Signed)
 Patient is alert and oriented x 4, affect is flat, thoughts are organized and coherent, he denies SI/HI/AVH. Patient on arrival appeared restless,depressed, affect is flat, he expressed anhedonia, sadness and worthlessness. Patient has a past psychiatric history of schizophrenia, and substance abuse. Patient  appears disheveled, skin warm to touch, scattered tattoos noted  on his upper and lower torso, skin assessment reveal no contraband. Patient was offered emotionally support, given food and taken to his room. 15 minutes safety checks maintained will continue to monitor.

## 2024-03-02 NOTE — BH IP Treatment Plan (Signed)
 Interdisciplinary Treatment and Diagnostic Plan Update  03/02/2024 Time of Session: 10:20 Chris Pevehouse Jr. MRN: [id number]  Principal Diagnosis: <principal problem not specified>  Secondary Diagnoses: Active Problems:   MDD (major depressive disorder), recurrent episode, severe (HCC)   Current Medications:  Current Facility-Administered Medications  Medication Dose Route Frequency Provider Last Rate Last Admin   acetaminophen  (TYLENOL ) tablet 650 mg  650 mg Oral Q6H PRN McLauchlin, Angela, NP       alum & mag hydroxide-simeth (MAALOX/MYLANTA) 200-200-20 MG/5ML suspension 30 mL  30 mL Oral Q4H PRN McLauchlin, Angela, NP       cariprazine  (VRAYLAR ) capsule 3 mg  3 mg Oral Daily McLauchlin, Angela, NP   3 mg at 03/02/24 1210   haloperidol  (HALDOL ) tablet 5 mg  5 mg Oral TID PRN McLauchlin, Angela, NP       And   diphenhydrAMINE  (BENADRYL ) capsule 50 mg  50 mg Oral TID PRN McLauchlin, Angela, NP       haloperidol  lactate (HALDOL ) injection 5 mg  5 mg Intramuscular TID PRN McLauchlin, Jon, NP       And   diphenhydrAMINE  (BENADRYL ) injection 50 mg  50 mg Intramuscular TID PRN McLauchlin, Angela, NP       And   LORazepam  (ATIVAN ) injection 2 mg  2 mg Intramuscular TID PRN McLauchlin, Jon, NP       haloperidol  lactate (HALDOL ) injection 10 mg  10 mg Intramuscular TID PRN McLauchlin, Angela, NP       And   diphenhydrAMINE  (BENADRYL ) injection 50 mg  50 mg Intramuscular TID PRN McLauchlin, Angela, NP       And   LORazepam  (ATIVAN ) injection 2 mg  2 mg Intramuscular TID PRN McLauchlin, Angela, NP       hydrOXYzine  (ATARAX ) tablet 25 mg  25 mg Oral TID PRN McLauchlin, Angela, NP   25 mg at 03/02/24 1210   lamoTRIgine  (LAMICTAL ) tablet 150 mg  150 mg Oral BID McLauchlin, Angela, NP   150 mg at 03/02/24 1210   magnesium  hydroxide (MILK OF MAGNESIA) suspension 30 mL  30 mL Oral Daily PRN McLauchlin, Angela, NP       traZODone  (DESYREL ) tablet 50 mg  50 mg Oral QHS PRN McLauchlin, Angela, NP        PTA Medications: Medications Prior to Admission  Medication Sig Dispense Refill Last Dose/Taking   cariprazine  (VRAYLAR ) 3 MG capsule Take 3 mg by mouth daily.      hydrOXYzine  (ATARAX /VISTARIL ) 25 MG tablet Take 1 tablet (25 mg total) by mouth every 6 (six) hours as needed for anxiety. 75 tablet 0    lamoTRIgine  (LAMICTAL ) 150 MG tablet Take 150 mg by mouth 2 (two) times daily.      melatonin 5 MG TABS Take 1 tablet (5 mg total) by mouth at bedtime. 30 tablet 0    nicotine  polacrilex (NICORETTE ) 2 MG gum Take 1 each (2 mg total) by mouth as needed for smoking cessation. 100 tablet 0    nystatin -triamcinolone  ointment (MYCOLOG) Apply 1 Application topically 2 (two) times daily. 30 g 0    omeprazole  (PRILOSEC) 20 MG capsule Take 1 capsule (20 mg total) by mouth daily. 30 capsule 0    polyethylene glycol (MIRALAX  / GLYCOLAX ) 17 g packet Take 17 g by mouth daily. 14 each 0     Patient Stressors: Financial difficulties   Medication change or noncompliance   Substance abuse    Patient Strengths: Ability for Contractor for  treatment/growth   Treatment Modalities: Medication Management, Group therapy, Case management,  1 to 1 session with clinician, Psychoeducation, Recreational therapy.   Physician Treatment Plan for Primary Diagnosis: <principal problem not specified> Long Term Goal(s):     Short Term Goals:    Medication Management: Evaluate patient's response, side effects, and tolerance of medication regimen.  Therapeutic Interventions: 1 to 1 sessions, Unit Group sessions and Medication administration.  Evaluation of Outcomes: Not Met  Physician Treatment Plan for Secondary Diagnosis: Active Problems:   MDD (major depressive disorder), recurrent episode, severe (HCC)  Long Term Goal(s):     Short Term Goals:       Medication Management: Evaluate patient's response, side effects, and tolerance of medication regimen.  Therapeutic  Interventions: 1 to 1 sessions, Unit Group sessions and Medication administration.  Evaluation of Outcomes: Not Met   RN Treatment Plan for Primary Diagnosis: <principal problem not specified> Long Term Goal(s): Knowledge of disease and therapeutic regimen to maintain health will improve  Short Term Goals: Ability to remain free from injury will improve, Ability to verbalize frustration and anger appropriately will improve, Ability to demonstrate self-control, Ability to participate in decision making will improve, Ability to verbalize feelings will improve, Ability to disclose and discuss suicidal ideas, Ability to identify and develop effective coping behaviors will improve, and Compliance with prescribed medications will improve  Medication Management: RN will administer medications as ordered by provider, will assess and evaluate patient's response and provide education to patient for prescribed medication. RN will report any adverse and/or side effects to prescribing provider.  Therapeutic Interventions: 1 on 1 counseling sessions, Psychoeducation, Medication administration, Evaluate responses to treatment, Monitor vital signs and CBGs as ordered, Perform/monitor CIWA, COWS, AIMS and Fall Risk screenings as ordered, Perform wound care treatments as ordered.  Evaluation of Outcomes: Not Met   LCSW Treatment Plan for Primary Diagnosis: <principal problem not specified> Long Term Goal(s): Safe transition to appropriate next level of care at discharge, Engage patient in therapeutic group addressing interpersonal concerns.  Short Term Goals: Engage patient in aftercare planning with referrals and resources, Increase social support, Increase ability to appropriately verbalize feelings, Increase emotional regulation, Facilitate acceptance of mental health diagnosis and concerns, Facilitate patient progression through stages of change regarding substance use diagnoses and concerns, Identify triggers  associated with mental health/substance abuse issues, and Increase skills for wellness and recovery  Therapeutic Interventions: Assess for all discharge needs, 1 to 1 time with Social worker, Explore available resources and support systems, Assess for adequacy in community support network, Educate family and significant other(s) on suicide prevention, Complete Psychosocial Assessment, Interpersonal group therapy.  Evaluation of Outcomes: Not Met   Progress in Treatment: Attending groups: No. Participating in groups: No. Taking medication as prescribed: Yes. Toleration medication: Yes. Family/Significant other contact made: No, will contact:  when given permission.  Patient understands diagnosis: Yes. Discussing patient identified problems/goals with staff: Yes. Medical problems stabilized or resolved: Yes. Denies suicidal/homicidal ideation: Yes. Issues/concerns per patient self-inventory: No. Other: none.   New problem(s) identified: No, Describe:  none identified.   New Short Term/Long Term Goal(s): medication management for mood stabilization; elimination of SI thoughts; development of comprehensive mental wellness/sobriety plan.  Patient Goals: Just want to get a stable mind.     Discharge Plan or Barriers: CSW will assist pt with development of an appropriate aftercare/discharge plan.   Reason for Continuation of Hospitalization: Depression Medication stabilization Suicidal ideation  Estimated Length of Stay: 1-7 days  Last 3  [place] Suicide Severity Risk Score: Flowsheet Row Admission (Current) from 03/02/2024 in [facility] ED from 03/01/2024 in [facility] Emergency Department at [facility] ED from 02/21/2024 in [facility] Emergency Department at [facility]  C-SSRS RISK CATEGORY No Risk No Risk No Risk    Last PHQ 2/9 Scores:    09/13/2021    3:42 AM 09/10/2019   11:16 AM 09/05/2019    7:43 PM  Depression screen PHQ 2/9   Decreased Interest 3 3 3   Down, Depressed, Hopeless 3 3 3   PHQ - 2 Score 6 6 6   Altered sleeping 2 3 3   Tired, decreased energy 2 3 3   Change in appetite 2 3 3   Feeling bad or failure about yourself  3 3 3   Trouble concentrating 3 3 3   Moving slowly or fidgety/restless 3 2 3   Suicidal thoughts 1 3 3   PHQ-9 Score 22  26  27    Difficult doing work/chores Very difficult Extremely dIfficult      Data saved with a previous flowsheet row definition    Scribe for Treatment Team: Nadara JONELLE Fam, LCSW 03/02/2024 12:49 PM

## 2024-03-02 NOTE — ED Notes (Signed)
 Patient's belongings will be taken and handed off to inpatient BMU staff.

## 2024-03-03 ENCOUNTER — Encounter: Payer: Self-pay | Admitting: Psychiatry

## 2024-03-03 DIAGNOSIS — F332 Major depressive disorder, recurrent severe without psychotic features: Secondary | ICD-10-CM | POA: Diagnosis not present

## 2024-03-03 DIAGNOSIS — F819 Developmental disorder of scholastic skills, unspecified: Secondary | ICD-10-CM | POA: Diagnosis not present

## 2024-03-03 LAB — LIPID PANEL
Cholesterol: 171 mg/dL (ref 0–200)
HDL: 41 mg/dL
LDL Cholesterol: 96 mg/dL (ref 0–99)
Total CHOL/HDL Ratio: 4.2 ratio
Triglycerides: 171 mg/dL — ABNORMAL HIGH
VLDL: 34 mg/dL (ref 0–40)

## 2024-03-03 LAB — HEMOGLOBIN A1C
Hgb A1c MFr Bld: 5.1 % (ref 4.8–5.6)
Mean Plasma Glucose: 99.67 mg/dL

## 2024-03-03 MED ORDER — NICOTINE POLACRILEX 2 MG MT GUM
2.0000 mg | CHEWING_GUM | OROMUCOSAL | Status: DC | PRN
  Administered 2024-03-03 – 2024-03-07 (×13): 2 mg via ORAL
  Filled 2024-03-03 (×12): qty 1

## 2024-03-03 NOTE — Group Note (Signed)
 Recreation Therapy Group Note   Group Topic:Health and Wellness  Group Date: 03/03/2024 Start Time: 1445 End Time: 1545 Facilitators: Celestia Jeoffrey BRAVO, LRT, CTRS Location: Courtyard  Group Description: [employer]. LRT and patients played games of basketball, drew with chalk, and played corn hole while outside in the courtyard while getting fresh air and sunlight. Music was being played in the background. LRT and peers conversed about different games they have played before, what they do in their free time and anything else that is on their minds. LRT encouraged pts to drink water after being outside, sweating and getting their heart rate up.  Goal Area(s) Addressed: Patient will build on frustration tolerance skills. Patients will partake in a competitive play game with peers. Patients will gain knowledge of new leisure interest/hobby.    Affect/Mood: Appropriate   Participation Level: Active and Engaged   Participation Quality: Independent   Behavior: Appropriate, Calm, and Cooperative   Speech/Thought Process: Coherent   Insight: Good   Judgement: Good   Modes of Intervention: Exploration, Guided Discussion, and Music   Patient Response to Interventions:  Attentive, Engaged, Interested , and Receptive   Education Outcome:  Acknowledges education   Clinical Observations/Individualized Feedback: Yeshaya was active in their participation of session activities and group discussion. Pt interacted well with LRT and peers duration of session.    Plan: Continue to engage patient in RT group sessions 2-3x/week.   Jeoffrey BRAVO Celestia, LRT, CTRS 03/03/2024 3:56 PM

## 2024-03-03 NOTE — Group Note (Signed)
 Recreation Therapy Group Note   Group Topic:Animal Assisted Therapy   Group Date: 03/03/2024 Start Time: 1000 End Time: 1020 Facilitators: Celestia Jeoffrey BRAVO, LRT, CTRS Location: Dayroom  Group Description: AAA. Animal-Assisted Activity provides opportunities for motivational, educational, therapeutic and/or recreational benefits to enhance quality of life. Selinda and Rollo visited the unit to interact with patients.    Goal Areas Addressed:  Reduced anxiety and stress Improved mood Increased social interaction Enhanced communication skills Reduced loneliness and isolation Improved emotional regulation   Affect/Mood: N/A   Participation Level: Did not attend    Clinical Observations/Individualized Feedback: Patient did not attend.  Plan: Continue to engage patient in RT group sessions 2-3x/week.   Jeoffrey BRAVO Celestia, LRT, CTRS 03/03/2024 11:42 AM

## 2024-03-03 NOTE — Group Note (Signed)
 Date:  03/03/2024 Time:  10:17 PM  Group Topic/Focus:  Primary and Secondary Emotions:   The focus of this group is to discuss the difference between primary and secondary emotions. Wrap-Up Group:   The focus of this group is to help patients review their daily goal of treatment and discuss progress on daily workbooks.    Participation Level:  Active  Participation Quality:  Appropriate and Attentive  Affect:  Appropriate  Cognitive:  Alert and Appropriate  Insight: Appropriate and Good  Engagement in Group:  Engaged  Modes of Intervention:  Problem-solving  Additional Comments:     Arlester CHRISTELLA Servant 03/03/2024, 10:17 PM

## 2024-03-03 NOTE — Plan of Care (Signed)
   Problem: Education: Goal: Emotional status will improve Outcome: Progressing Goal: Mental status will improve Outcome: Progressing

## 2024-03-03 NOTE — Group Note (Signed)
 [facility] LCSW Group Therapy Note   Group Date: 03/03/2024 Start Time: 1300 End Time: 1400  Type of Therapy/Topic:  Group Therapy:  Feelings about Diagnosis  Participation Level:  Active   Description of Group:    This group will allow patients to explore their thoughts and feelings about diagnoses they have received. Patients will be guided to explore their level of understanding and acceptance of these diagnoses. Facilitator will encourage patients to process their thoughts and feelings about the reactions of others to their diagnosis, and will guide patients in identifying ways to discuss their diagnosis with significant others in their lives. This group will be process-oriented, with patients participating in exploration of their own experiences as well as giving and receiving support and challenge from other group members.   Therapeutic Goals: 1. Patient will demonstrate understanding of diagnosis as evidence by identifying two or more symptoms of the disorder:  2. Patient will be able to express two feelings regarding the diagnosis 3. Patient will demonstrate ability to communicate their needs through discussion and/or role plays  Summary of Patient Progress: Patient was present for the entirety of group. He was engaged in the conversation and comments were pertinent to the topic. He appeared to have slight insight into himself and the topic. Pt appeared open and receptive to feedback/comments from both his peers and the facilitator.   Therapeutic Modalities:   Cognitive Behavioral Therapy Brief Therapy Feelings Identification    Nadara JONELLE Fam, LCSW

## 2024-03-03 NOTE — Progress Notes (Signed)
 [facility] MD Progress Note  03/03/2024 9:40 PM Chris Olson.  MRN:  [id number]  Chris Olson. is a 34 year old, English speaking, Black male. Pt presented to [facility] ED voluntarily. Per triage note: Pt presents via POV c/o body aches, generalized weakness, and cough, reports symptoms have been ongoing for a year. Also reports he is suicidal. Reports I'm tired of feeling like I don't matter. Reports have no plan to hurt himself at current moment but reports has had plans in the past.  On assessment, the patient was visibly depressed. Pt was forthcoming when asked assessment questions. With encouragement, pt. began explaining that he has ongoing relationship issues. Pt identified having multiple life stressors including worsening thoughts of SI, paranoia, and feelings of hopelessness. Pt reported that he often questions what's the point of him being here. Pt reported having chronic depression and issues with sleep disturbance, explaining that he can't shut his mind off.Patient is admitted to adult  psych unit with Q15 min safety monitoring. Multidisciplinary team approach is offered. Medication management; group/milieu therapy is offered.  Subjective:  Chart reviewed, case discussed in multidisciplinary meeting, patient seen during rounds.    Patient is noted to be resting in bed.  He offers no complaints.  He reports after resting last night he feels better.  He denies active SI/HI/plan and denies hallucinations.  He remains discharge focused and minimizes his presentation and symptoms today and spite of him noted to be crying on multiple occasions yesterday.  He is taking his medications with no reported side effects.  Patient is encouraged to participate in groups as he is noted to be isolating in his room. Past Psychiatric History: see h&P Family History: History reviewed. No pertinent family history. Social History:  Social History   Substance and Sexual Activity  Alcohol Use Yes    Alcohol/week: 15.0 standard drinks of alcohol   Types: 15 Cans of beer per week   Comment: (2) 40 oz beers per day most days     Social History   Substance and Sexual Activity  Drug Use Yes   Types: Marijuana, Cocaine    Social History   Socioeconomic History   Marital status: Divorced    Spouse name: Not on file   Number of children: 1   Years of education: Not on file   Highest education level: Not on file  Occupational History   Not on file  Tobacco Use   Smoking status: Every Day    Current packs/day: 1.00    Types: Cigarettes   Smokeless tobacco: Never  Vaping Use   Vaping status: Never Used  Substance and Sexual Activity   Alcohol use: Yes    Alcohol/week: 15.0 standard drinks of alcohol    Types: 15 Cans of beer per week    Comment: (2) 40 oz beers per day most days   Drug use: Yes    Types: Marijuana, Cocaine   Sexual activity: Not Currently  Other Topics Concern   Not on file  Social History Narrative   Not on file   Social Drivers of Health   Tobacco Use: High Risk (03/02/2024)   Patient History    Smoking Tobacco Use: Every Day    Smokeless Tobacco Use: Never    Passive Exposure: Not on file  Financial Resource Strain: Not on file  Food Insecurity: No Food Insecurity (03/02/2024)   Epic    Worried About Radiation Protection Practitioner of Food in the Last Year: Never true    Ran Out  of Food in the Last Year: Never true  Transportation Needs: Unmet Transportation Needs (03/02/2024)   Epic    Lack of Transportation (Medical): Yes    Lack of Transportation (Non-Medical): Yes  Physical Activity: Not on file  Stress: Not on file  Social Connections: Moderately Isolated (03/02/2024)   Social Connection and Isolation Panel    Frequency of Communication with Friends and Family: Once a week    Frequency of Social Gatherings with Friends and Family: Once a week    Attends Religious Services: More than 4 times per year    Active Member of [employer] or Organizations: No    Attends Occupational Hygienist Meetings: Never    Marital Status: Living with partner  Depression (PHQ2-9): High Risk (09/13/2021)   Depression (PHQ2-9)    PHQ-2 Score: 22  Alcohol Screen: High Risk (03/02/2024)   Alcohol Screen    Last Alcohol Screening Score (AUDIT): 17  Housing: High Risk (03/02/2024)   Epic    Unable to Pay for Housing in the Last Year: No    Number of Times Moved in the Last Year: 1    Homeless in the Last Year: Yes  Utilities: Not At Risk (03/02/2024)   Epic    Threatened with loss of utilities: No  Health Literacy: Not on file   Past Medical History:  Past Medical History:  Diagnosis Date   Alcoholism (HCC)    Anxiety    Chest pain    Cocaine abuse with cocaine-induced mood disorder (HCC) 11/11/2017   Depression    Pericarditis    Schizophrenia (HCC)    History reviewed. No pertinent surgical history.  Current Medications: Current Facility-Administered Medications  Medication Dose Route Frequency Provider Last Rate Last Admin   acetaminophen  (TYLENOL ) tablet 650 mg  650 mg Oral Q6H PRN McLauchlin, Angela, NP       alum & mag hydroxide-simeth (MAALOX/MYLANTA) 200-200-20 MG/5ML suspension 30 mL  30 mL Oral Q4H PRN McLauchlin, Angela, NP       bisacodyl  (DULCOLAX) EC tablet 10 mg  10 mg Oral Daily PRN Prisma Decarlo, MD       cariprazine  (VRAYLAR ) capsule 3 mg  3 mg Oral Daily McLauchlin, Angela, NP   3 mg at 03/03/24 9183   haloperidol  (HALDOL ) tablet 5 mg  5 mg Oral TID PRN McLauchlin, Angela, NP       And   diphenhydrAMINE  (BENADRYL ) capsule 50 mg  50 mg Oral TID PRN McLauchlin, Angela, NP       haloperidol  lactate (HALDOL ) injection 5 mg  5 mg Intramuscular TID PRN McLauchlin, Angela, NP       And   diphenhydrAMINE  (BENADRYL ) injection 50 mg  50 mg Intramuscular TID PRN McLauchlin, Jon, NP       And   LORazepam  (ATIVAN ) injection 2 mg  2 mg Intramuscular TID PRN McLauchlin, Jon, NP       haloperidol  lactate (HALDOL ) injection 10 mg  10 mg Intramuscular TID PRN  McLauchlin, Angela, NP       And   diphenhydrAMINE  (BENADRYL ) injection 50 mg  50 mg Intramuscular TID PRN McLauchlin, Angela, NP       And   LORazepam  (ATIVAN ) injection 2 mg  2 mg Intramuscular TID PRN McLauchlin, Angela, NP       hydrOXYzine  (ATARAX ) tablet 25 mg  25 mg Oral TID PRN McLauchlin, Angela, NP   25 mg at 03/03/24 2113   lamoTRIgine  (LAMICTAL ) tablet 150 mg  150 mg Oral BID  McLauchlin, Angela, NP   150 mg at 03/03/24 1802   magnesium  hydroxide (MILK OF MAGNESIA) suspension 30 mL  30 mL Oral Daily PRN McLauchlin, Angela, NP       nicotine  polacrilex (NICORETTE ) gum 2 mg  2 mg Oral PRN Britne Borelli, MD   2 mg at 03/03/24 1803   traZODone  (DESYREL ) tablet 50 mg  50 mg Oral QHS PRN McLauchlin, Jon, NP        Lab Results:  Results for orders placed or performed during the hospital encounter of 03/02/24 (from the past 48 hours)  Lipid panel     Status: Abnormal   Collection Time: 03/03/24  2:20 PM  Result Value Ref Range   Cholesterol 171 0 - 200 mg/dL    Comment:        ATP III CLASSIFICATION:  <200     mg/dL   Desirable  799-760  mg/dL   Borderline High  >=759    mg/dL   High           Triglycerides 171 (H) <150 mg/dL   HDL 41 >59 mg/dL   Total CHOL/HDL Ratio 4.2 RATIO   VLDL 34 0 - 40 mg/dL   LDL Cholesterol 96 0 - 99 mg/dL    Comment:        Total Cholesterol/HDL:CHD Risk Coronary Heart Disease Risk Table                     Men   Women  1/2 Average Risk   3.4   3.3  Average Risk       5.0   4.4  2 X Average Risk   9.6   7.1  3 X Average Risk  23.4   11.0        Use the calculated Patient Ratio above and the CHD Risk Table to determine the patient's CHD Risk.        ATP III CLASSIFICATION (LDL):  <100     mg/dL   Optimal  899-870  mg/dL   Near or Above                    Optimal  130-159  mg/dL   Borderline  839-810  mg/dL   High  >809     mg/dL   Very High Performed at [facility], [address] [place], [place] [address]    Hemoglobin A1c     Status: None   Collection Time: 03/03/24  2:20 PM  Result Value Ref Range   Hgb A1c MFr Bld 5.1 4.8 - 5.6 %    Comment: (NOTE) Diagnosis of Diabetes The following HbA1c ranges recommended by the American Diabetes Association (ADA) may be used as an aid in the diagnosis of diabetes mellitus.  Hemoglobin             Suggested A1C NGSP%              Diagnosis  <5.7                   Non Diabetic  5.7-6.4                Pre-Diabetic  >6.4                   Diabetic  <7.0                   Glycemic control for  adults with diabetes.     Mean Plasma Glucose 99.67 mg/dL    Comment: Performed at [facility], [address], [place], [place] [address]    Blood Alcohol level:  Lab Results  Component Value Date   [facility] <15 03/01/2024   ETH <15 08/02/2023    Metabolic Disorder Labs: Lab Results  Component Value Date   HGBA1C 5.1 03/03/2024   MPG 99.67 03/03/2024   MPG 97 08/05/2023   No results found for: PROLACTIN Lab Results  Component Value Date   CHOL 171 03/03/2024   TRIG 171 (H) 03/03/2024   HDL 41 03/03/2024   CHOLHDL 4.2 03/03/2024   VLDL 34 03/03/2024   LDLCALC 96 03/03/2024   LDLCALC 77 08/05/2023    Physical Findings: AIMS:  , ,  ,  ,    CIWA:    COWS:      Psychiatric Specialty Exam:  Presentation  General Appearance:  Appropriate for Environment  Eye Contact: Fair  Speech: Clear and Coherent  Speech Volume: Normal    Mood and Affect  Mood: Depressed; Dysphoric  Affect: Tearful; Congruent; Depressed; Flat   Thought Process  Thought Processes: Coherent  Orientation:Full (Time, Place and Person)  Thought Content:Logical  Hallucinations:Hallucinations: None  Ideas of Reference:None  Suicidal Thoughts:Suicidal Thoughts: Yes, Passive SI Passive Intent and/or Plan: Without Intent  Homicidal Thoughts:Homicidal Thoughts: No   Sensorium  Memory: Immediate  Fair  Judgment: Impaired  Insight: Lacking   Executive Functions  Concentration: Fair  Attention Span: Fair  Recall: Fair  Fund of Knowledge: Poor  Language: Fair   Psychomotor Activity  Psychomotor Activity: Psychomotor Activity: Normal  Musculoskeletal: Strength & Muscle Tone: within normal limits Gait & Station: normal Assets  Assets: Manufacturing Systems Engineer; Desire for Improvement; Resilience    Physical Exam: Physical Exam ROS Blood pressure 116/82, pulse 98, temperature (!) 97.5 F (36.4 C), temperature source Oral, resp. rate 18, height 5' 8 (1.727 m), weight 61.7 kg, SpO2 99%. Body mass index is 20.68 kg/m.  Diagnosis: Active Problems:   MDD (major depressive disorder), recurrent severe, without psychosis (HCC)  Treatment Plan Summary:  Safety and Monitoring:             -- Voluntary admission to inpatient psychiatric unit for safety, stabilization and treatment             -- Daily contact with patient to assess and evaluate symptoms and progress in treatment             -- Patient's case to be discussed in multi-disciplinary team meeting             -- Observation Level: q15 minute checks             -- Vital signs:  q12 hours             -- Precautions: suicide, elopement, and assault   2. Psychiatric Diagnoses and Treatment:                Vraylar  3 mg and Lamictal  150 mg BID Patient is refusing any medication adjustments at this time   -- The risks/benefits/side-effects/alternatives to this medication were discussed in detail with the patient and time was given for questions. The patient consents to medication trial.                -- Metabolic profile and EKG monitoring obtained while on an atypical antipsychotic (BMI: Lipid Panel: HbgA1c: QTc:)              --  Encouraged patient to participate in unit milieu and in scheduled group therapies                            3. Medical Issues Being Addressed:     No urgent medical needs  identified at this time. 4. Discharge Planning:              -- Social work and case management to assist with discharge planning and identification of hospital follow-up needs prior to discharge             -- Estimated LOS: 5-7 days             -- Discharge Concerns: Need to establish a safety plan; Medication compliance and effectiveness             -- Discharge Goals: Return home with outpatient referrals follow ups  Allyn Foil, MD 03/03/2024, 9:40 PM

## 2024-03-03 NOTE — [facility] Counselor (Signed)
 CSW spoke with the patient about aftercare. Patient is open to a referral for ACTT with [facility] ONLY.  Sherryle Margo, MSW, LCSW 03/03/2024 11:24 AM

## 2024-03-03 NOTE — Progress Notes (Signed)
" °   03/03/24 0900  Psych Admission Type (Psych Patients Only)  Admission Status Voluntary  Psychosocial Assessment  Patient Complaints Depression;Loneliness (states  I just want someone to care for me I look at all these families walking around and I just want a family I do no have anyone)  Eye Contact Fair  Facial Expression Sad  Affect Sad  Speech Logical/coherent  Interaction Assertive  Motor Activity Slow  Appearance/Hygiene In scrubs  Behavior Characteristics Cooperative  Mood Depressed  Thought Process  Coherency WDL  Content WDL  Delusions None reported or observed  Perception WDL  Hallucination None reported or observed  Judgment WDL  Confusion None  Danger to Self  Current suicidal ideation? Denies    "

## 2024-03-03 NOTE — Plan of Care (Signed)
  Problem: Education: Goal: Mental status will improve Outcome: Progressing   

## 2024-03-04 DIAGNOSIS — F819 Developmental disorder of scholastic skills, unspecified: Secondary | ICD-10-CM | POA: Diagnosis not present

## 2024-03-04 DIAGNOSIS — F332 Major depressive disorder, recurrent severe without psychotic features: Secondary | ICD-10-CM | POA: Diagnosis not present

## 2024-03-04 NOTE — Group Note (Signed)
 Date:  03/04/2024 Time:  10:40 AM  Group Topic/Focus:  Healthy Communication:   The focus of this group is to discuss communication, barriers to communication, as well as healthy ways to communicate with others.    Participation Level:  Did Not Attend  Participation Quality:    Affect:    Cognitive:    Insight:   Engagement in Group:    Modes of Intervention:    Additional Comments:    Chris Olson 03/04/2024, 10:40 AM

## 2024-03-04 NOTE — [facility] Counselor (Signed)
 CSW engaged patient in safe discharge planning.   Patient agreed to ACTT team referral. Patient reported that he was once with [facility]'s ACTT team before being discharged. Patient reported that he was unsure of why he was discharged.   CSW sent ACTT referral on patient's behalf.    Tonita Bills, MSW, LCSWA 03/04/2024 3:07 PM

## 2024-03-04 NOTE — Plan of Care (Signed)
   Problem: Education: Goal: Knowledge of Chris Olson General Education information/materials will improve Outcome: Progressing Goal: Emotional status will improve Outcome: Progressing Goal: Mental status will improve Outcome: Progressing Goal: Verbalization of understanding the information provided will improve Outcome: Progressing   Problem: Activity: Goal: Interest or engagement in activities will improve Outcome: Progressing Goal: Sleeping patterns will improve Outcome: Progressing   Problem: Coping: Goal: Ability to verbalize frustrations and anger appropriately will improve Outcome: Progressing Goal: Ability to demonstrate self-control will improve Outcome: Progressing

## 2024-03-04 NOTE — Progress Notes (Signed)
" °   03/04/24 1100  Psych Admission Type (Psych Patients Only)  Admission Status Voluntary  Psychosocial Assessment  Patient Complaints None (patient stated 'I'm feeling must better.)  Eye Contact Fair  Facial Expression Other (Comment) (appropriate)  Affect Appropriate to circumstance  Speech Logical/coherent  Interaction Assertive  Motor Activity Slow  Appearance/Hygiene Improved;Unremarkable  Behavior Characteristics Cooperative;Appropriate to situation  Mood Pleasant  Aggressive Behavior  Effect No apparent injury  Thought Process  Coherency WDL  Content WDL  Delusions None reported or observed  Perception WDL  Hallucination None reported or observed  Judgment WDL  Confusion None  Danger to Self  Current suicidal ideation? Denies  Self-Injurious Behavior No self-injurious ideation or behavior indicators observed or expressed   Agreement Not to Harm Self Yes  Description of Agreement Verbal  Danger to Others  Danger to Others None reported or observed   Patient's goal for today, per his self-inventory is learning to have self-control with my feelings and learning to stop giving up and love myself more, in which he will attend groups and learning to not be antisocial in order to achieve his goal. "

## 2024-03-04 NOTE — Progress Notes (Signed)
 [facility] MD Progress Note  03/04/2024 2:13 PM Chris Olson.  MRN:  [id number]  Chris Olson. is a 34 year old, English speaking, Black male. Pt presented to [facility] ED voluntarily. Per triage note: Pt presents via POV c/o body aches, generalized weakness, and cough, reports symptoms have been ongoing for a year. Also reports he is suicidal. Reports I'm tired of feeling like I don't matter. Reports have no plan to hurt himself at current moment but reports has had plans in the past.  On assessment, the patient was visibly depressed. Pt was forthcoming when asked assessment questions. With encouragement, pt. began explaining that he has ongoing relationship issues. Pt identified having multiple life stressors including worsening thoughts of SI, paranoia, and feelings of hopelessness. Pt reported that he often questions what's the point of him being here. Pt reported having chronic depression and issues with sleep disturbance, explaining that he can't shut his mind off.Patient is admitted to adult  psych unit with Q15 min safety monitoring. Multidisciplinary team approach is offered. Medication management; group/milieu therapy is offered.  Subjective:  Chart reviewed, case discussed in multidisciplinary meeting, patient seen during rounds.   Patient is noted to be resting in bed.  He reports feeling lot better and reports that his partner came by to visit him last night.  He reports that he and his partner discussed things over and he feels a lot better.  He denies SI/HI/plan and denies hallucinations.  Patient reports fair appetite and sleep.  He is tolerating his home medications fairly well with no reported side effects.  Past Psychiatric History: see h&P Family History: History reviewed. No pertinent family history. Social History:  Social History   Substance and Sexual Activity  Alcohol Use Yes   Alcohol/week: 15.0 standard drinks of alcohol   Types: 15 Cans of beer per week   Comment: (2)  40 oz beers per day most days     Social History   Substance and Sexual Activity  Drug Use Yes   Types: Marijuana, Cocaine    Social History   Socioeconomic History   Marital status: Divorced    Spouse name: Not on file   Number of children: 1   Years of education: Not on file   Highest education level: Not on file  Occupational History   Not on file  Tobacco Use   Smoking status: Every Day    Current packs/day: 1.00    Types: Cigarettes   Smokeless tobacco: Never  Vaping Use   Vaping status: Never Used  Substance and Sexual Activity   Alcohol use: Yes    Alcohol/week: 15.0 standard drinks of alcohol    Types: 15 Cans of beer per week    Comment: (2) 40 oz beers per day most days   Drug use: Yes    Types: Marijuana, Cocaine   Sexual activity: Not Currently  Other Topics Concern   Not on file  Social History Narrative   Not on file   Social Drivers of Health   Tobacco Use: High Risk (03/02/2024)   Patient History    Smoking Tobacco Use: Every Day    Smokeless Tobacco Use: Never    Passive Exposure: Not on file  Financial Resource Strain: Not on file  Food Insecurity: No Food Insecurity (03/02/2024)   Epic    Worried About Programme Researcher, Broadcasting/film/video in the Last Year: Never true    Ran Out of Food in the Last Year: Never true  Transportation Needs: Unmet Transportation  Needs (03/02/2024)   Epic    Lack of Transportation (Medical): Yes    Lack of Transportation (Non-Medical): Yes  Physical Activity: Not on file  Stress: Not on file  Social Connections: Moderately Isolated (03/02/2024)   Social Connection and Isolation Panel    Frequency of Communication with Friends and Family: Once a week    Frequency of Social Gatherings with Friends and Family: Once a week    Attends Religious Services: More than 4 times per year    Active Member of [employer] or Organizations: No    Attends Banker Meetings: Never    Marital Status: Living with partner  Depression (PHQ2-9):  High Risk (09/13/2021)   Depression (PHQ2-9)    PHQ-2 Score: 22  Alcohol Screen: High Risk (03/02/2024)   Alcohol Screen    Last Alcohol Screening Score (AUDIT): 17  Housing: High Risk (03/02/2024)   Epic    Unable to Pay for Housing in the Last Year: No    Number of Times Moved in the Last Year: 1    Homeless in the Last Year: Yes  Utilities: Not At Risk (03/02/2024)   Epic    Threatened with loss of utilities: No  Health Literacy: Not on file   Past Medical History:  Past Medical History:  Diagnosis Date   Alcoholism (HCC)    Anxiety    Chest pain    Cocaine abuse with cocaine-induced mood disorder (HCC) 11/11/2017   Depression    Pericarditis    Schizophrenia (HCC)    History reviewed. No pertinent surgical history.  Current Medications: Current Facility-Administered Medications  Medication Dose Route Frequency Provider Last Rate Last Admin   acetaminophen  (TYLENOL ) tablet 650 mg  650 mg Oral Q6H PRN McLauchlin, Angela, NP       alum & mag hydroxide-simeth (MAALOX/MYLANTA) 200-200-20 MG/5ML suspension 30 mL  30 mL Oral Q4H PRN McLauchlin, Angela, NP       bisacodyl  (DULCOLAX) EC tablet 10 mg  10 mg Oral Daily PRN Arbutus Nelligan, MD       cariprazine  (VRAYLAR ) capsule 3 mg  3 mg Oral Daily McLauchlin, Angela, NP   3 mg at 03/04/24 9173   haloperidol  (HALDOL ) tablet 5 mg  5 mg Oral TID PRN McLauchlin, Angela, NP       And   diphenhydrAMINE  (BENADRYL ) capsule 50 mg  50 mg Oral TID PRN McLauchlin, Angela, NP       haloperidol  lactate (HALDOL ) injection 5 mg  5 mg Intramuscular TID PRN McLauchlin, Jon, NP       And   diphenhydrAMINE  (BENADRYL ) injection 50 mg  50 mg Intramuscular TID PRN McLauchlin, Angela, NP       And   LORazepam  (ATIVAN ) injection 2 mg  2 mg Intramuscular TID PRN McLauchlin, Jon, NP       haloperidol  lactate (HALDOL ) injection 10 mg  10 mg Intramuscular TID PRN McLauchlin, Angela, NP       And   diphenhydrAMINE  (BENADRYL ) injection 50 mg  50 mg  Intramuscular TID PRN McLauchlin, Angela, NP       And   LORazepam  (ATIVAN ) injection 2 mg  2 mg Intramuscular TID PRN McLauchlin, Angela, NP       hydrOXYzine  (ATARAX ) tablet 25 mg  25 mg Oral TID PRN McLauchlin, Angela, NP   25 mg at 03/03/24 2113   lamoTRIgine  (LAMICTAL ) tablet 150 mg  150 mg Oral BID McLauchlin, Angela, NP   150 mg at 03/04/24 0825   magnesium   hydroxide (MILK OF MAGNESIA) suspension 30 mL  30 mL Oral Daily PRN McLauchlin, Angela, NP       nicotine  polacrilex (NICORETTE ) gum 2 mg  2 mg Oral PRN Levenia Skalicky, MD   2 mg at 03/04/24 9173   traZODone  (DESYREL ) tablet 50 mg  50 mg Oral QHS PRN McLauchlin, Jon, NP        Lab Results:  Results for orders placed or performed during the hospital encounter of 03/02/24 (from the past 48 hours)  Lipid panel     Status: Abnormal   Collection Time: 03/03/24  2:20 PM  Result Value Ref Range   Cholesterol 171 0 - 200 mg/dL    Comment:        ATP III CLASSIFICATION:  <200     mg/dL   Desirable  799-760  mg/dL   Borderline High  >=759    mg/dL   High           Triglycerides 171 (H) <150 mg/dL   HDL 41 >59 mg/dL   Total CHOL/HDL Ratio 4.2 RATIO   VLDL 34 0 - 40 mg/dL   LDL Cholesterol 96 0 - 99 mg/dL    Comment:        Total Cholesterol/HDL:CHD Risk Coronary Heart Disease Risk Table                     Men   Women  1/2 Average Risk   3.4   3.3  Average Risk       5.0   4.4  2 X Average Risk   9.6   7.1  3 X Average Risk  23.4   11.0        Use the calculated Patient Ratio above and the CHD Risk Table to determine the patient's CHD Risk.        ATP III CLASSIFICATION (LDL):  <100     mg/dL   Optimal  899-870  mg/dL   Near or Above                    Optimal  130-159  mg/dL   Borderline  839-810  mg/dL   High  >809     mg/dL   Very High Performed at [facility], [address] [place], [place] [address]   Hemoglobin A1c     Status: None   Collection Time: 03/03/24  2:20 PM  Result Value Ref Range    Hgb A1c MFr Bld 5.1 4.8 - 5.6 %    Comment: (NOTE) Diagnosis of Diabetes The following HbA1c ranges recommended by the American Diabetes Association (ADA) may be used as an aid in the diagnosis of diabetes mellitus.  Hemoglobin             Suggested A1C NGSP%              Diagnosis  <5.7                   Non Diabetic  5.7-6.4                Pre-Diabetic  >6.4                   Diabetic  <7.0                   Glycemic control for  adults with diabetes.     Mean Plasma Glucose 99.67 mg/dL    Comment: Performed at [facility], [address], [place], [place] [address]    Blood Alcohol level:  Lab Results  Component Value Date   [facility] <15 03/01/2024   ETH <15 08/02/2023    Metabolic Disorder Labs: Lab Results  Component Value Date   HGBA1C 5.1 03/03/2024   MPG 99.67 03/03/2024   MPG 97 08/05/2023   No results found for: PROLACTIN Lab Results  Component Value Date   CHOL 171 03/03/2024   TRIG 171 (H) 03/03/2024   HDL 41 03/03/2024   CHOLHDL 4.2 03/03/2024   VLDL 34 03/03/2024   LDLCALC 96 03/03/2024   LDLCALC 77 08/05/2023    Physical Findings: AIMS:  , ,  ,  ,    CIWA:    COWS:      Psychiatric Specialty Exam:  Presentation  General Appearance:  Appropriate for Environment  Eye Contact: Fair  Speech: Clear and Coherent  Speech Volume: Normal    Mood and Affect  Mood: Fine Affect: Stable  Thought Process  Thought Processes: Coherent  Orientation:Full (Time, Place and Person)  Thought Content:Logical  Hallucinations: Denies  Ideas of Reference:None  Suicidal Thoughts: Denies  Homicidal Thoughts: Denies   Sensorium  Memory: Immediate Fair  Judgment: Impaired  Insight: Lacking   Art Therapist  Concentration: Fair  Attention Span: Fair  Recall: [employer] of Knowledge: Poor  Language: Fair   Psychomotor Activity  Psychomotor Activity: No data  recorded  Musculoskeletal: Strength & Muscle Tone: within normal limits Gait & Station: normal Assets  Assets: Manufacturing Systems Engineer; Desire for Improvement; Resilience    Physical Exam: Physical Exam ROS Blood pressure 114/80, pulse 76, temperature 98.2 F (36.8 C), temperature source Oral, resp. rate 16, height 5' 8 (1.727 m), weight 61.7 kg, SpO2 100%. Body mass index is 20.68 kg/m.  Diagnosis: Active Problems:   MDD (major depressive disorder), recurrent severe, without psychosis (HCC)  Treatment Plan Summary:  Safety and Monitoring:             -- Voluntary admission to inpatient psychiatric unit for safety, stabilization and treatment             -- Daily contact with patient to assess and evaluate symptoms and progress in treatment             -- Patient's case to be discussed in multi-disciplinary team meeting             -- Observation Level: q15 minute checks             -- Vital signs:  q12 hours             -- Precautions: suicide, elopement, and assault   2. Psychiatric Diagnoses and Treatment:                Vraylar  3 mg and Lamictal  150 mg BID Patient is refusing any medication adjustments at this time   -- The risks/benefits/side-effects/alternatives to this medication were discussed in detail with the patient and time was given for questions. The patient consents to medication trial.                -- Metabolic profile and EKG monitoring obtained while on an atypical antipsychotic (BMI: Lipid Panel: HbgA1c: QTc:)              -- Encouraged patient to participate in unit milieu and in scheduled  group therapies                            3. Medical Issues Being Addressed:     No urgent medical needs identified at this time. 4. Discharge Planning:              -- Social work and case management to assist with discharge planning and identification of hospital follow-up needs prior to discharge             -- Estimated LOS: 5-7 days             -- Discharge  Concerns: Need to establish a safety plan; Medication compliance and effectiveness             -- Discharge Goals: Return home with outpatient referrals follow ups  Allyn Foil, MD 03/04/2024, 2:13 PM

## 2024-03-04 NOTE — Group Note (Signed)
 [facility] LCSW Group Therapy Note   Group Date: 03/04/2024 Start Time: 1246 End Time: 1405   Type of Therapy/Topic:  Group Therapy:  Emotion Regulation  Participation Level:  Active   Mood:  Description of Group:    The purpose of this group is to assist patients in learning to regulate negative emotions and experience positive emotions. Patients will be guided to discuss ways in which they have been vulnerable to their negative emotions. These vulnerabilities will be juxtaposed with experiences of positive emotions or situations, and patients challenged to use positive emotions to combat negative ones. Special emphasis will be placed on coping with negative emotions in conflict situations, and patients will process healthy conflict resolution skills.  Therapeutic Goals: Patient will identify two positive emotions or experiences to reflect on in order to balance out negative emotions:  Patient will label two or more emotions that they find the most difficult to experience:  Patient will be able to demonstrate positive conflict resolution skills through discussion or role plays:   Summary of Patient Progress:   During group, patient and group explored the ways in which our thoughts can impact our feelings which impacts our behaviors. The patient actively engaged in the group session and shared personal strategies for regulating a range of emotions. The group, along with the facilitator, explored various situations that triggered both safe and unsafe feelings, and discussed effective methods for emotional regulation to support improved outcomes. The patient was open, supportive of peers, and receptive to feedback, contributing positively to the group dynamic.      Therapeutic Modalities:   Cognitive Behavioral Therapy Feelings Identification Dialectical Behavioral Therapy   Alveta CHRISTELLA Kerns, LCSW

## 2024-03-04 NOTE — Plan of Care (Signed)

## 2024-03-04 NOTE — Group Note (Signed)
 Date:  03/04/2024 Time:  8:40 PM  Group Topic/Focus:  Wrap-Up Group:   The focus of this group is to help patients review their daily goal of treatment and discuss progress on daily workbooks.    Participation Level:  Active  Participation Quality:  Appropriate and Attentive  Affect:  Appropriate  Cognitive:  Alert and Appropriate  Insight: Appropriate and Good  Engagement in Group:  Engaged  Modes of Intervention:  Discussion and Support  Additional Comments:    Chris Olson 03/04/2024, 8:40 PM

## 2024-03-05 DIAGNOSIS — F332 Major depressive disorder, recurrent severe without psychotic features: Secondary | ICD-10-CM | POA: Diagnosis not present

## 2024-03-05 NOTE — Progress Notes (Signed)
 [facility] MD Progress Note  03/05/2024 10:29 PM Chris Olson.  MRN:  [id number]  Chris Olson. is a 34 year old, English speaking, Black male. Pt presented to [facility] ED voluntarily. Per triage note: Pt presents via POV c/o body aches, generalized weakness, and cough, reports symptoms have been ongoing for a year. Also reports he is suicidal. Reports I'm tired of feeling like I don't matter. Reports have no plan to hurt himself at current moment but reports has had plans in the past.  On assessment, the patient was visibly depressed. Pt was forthcoming when asked assessment questions. With encouragement, pt. began explaining that he has ongoing relationship issues. Pt identified having multiple life stressors including worsening thoughts of SI, paranoia, and feelings of hopelessness. Pt reported that he often questions what's the point of him being here. Pt reported having chronic depression and issues with sleep disturbance, explaining that he can't shut his mind off.Patient is admitted to adult  psych unit with Q15 min safety monitoring. Multidisciplinary team approach is offered. Medication management; group/milieu therapy is offered.  Subjective:  Chart reviewed, case discussed in multidisciplinary meeting, patient seen during rounds.   Patient is noted to be sleeping in his room.  He offers no complaints.  Per social work team patient does not want to go back to his boardinghouse and will be going with his girlfriend.  He denies SI/HI/plan at.  He denies having any conflict with his girlfriend.  He denies auditory/visual hallucinations.  Past Psychiatric History: see h&P Family History: History reviewed. No pertinent family history. Social History:  Social History   Substance and Sexual Activity  Alcohol Use Yes   Alcohol/week: 15.0 standard drinks of alcohol   Types: 15 Cans of beer per week   Comment: (2) 40 oz beers per day most days     Social History   Substance and Sexual  Activity  Drug Use Yes   Types: Marijuana, Cocaine    Social History   Socioeconomic History   Marital status: Divorced    Spouse name: Not on file   Number of children: 1   Years of education: Not on file   Highest education level: Not on file  Occupational History   Not on file  Tobacco Use   Smoking status: Every Day    Current packs/day: 1.00    Types: Cigarettes   Smokeless tobacco: Never  Vaping Use   Vaping status: Never Used  Substance and Sexual Activity   Alcohol use: Yes    Alcohol/week: 15.0 standard drinks of alcohol    Types: 15 Cans of beer per week    Comment: (2) 40 oz beers per day most days   Drug use: Yes    Types: Marijuana, Cocaine   Sexual activity: Not Currently  Other Topics Concern   Not on file  Social History Narrative   Not on file   Social Drivers of Health   Tobacco Use: High Risk (03/02/2024)   Patient History    Smoking Tobacco Use: Every Day    Smokeless Tobacco Use: Never    Passive Exposure: Not on file  Financial Resource Strain: Not on file  Food Insecurity: No Food Insecurity (03/02/2024)   Epic    Worried About Programme Researcher, Broadcasting/film/video in the Last Year: Never true    Ran Out of Food in the Last Year: Never true  Transportation Needs: Unmet Transportation Needs (03/02/2024)   Epic    Lack of Transportation (Medical): Yes  Lack of Transportation (Non-Medical): Yes  Physical Activity: Not on file  Stress: Not on file  Social Connections: Moderately Isolated (03/02/2024)   Social Connection and Isolation Panel    Frequency of Communication with Friends and Family: Once a week    Frequency of Social Gatherings with Friends and Family: Once a week    Attends Religious Services: More than 4 times per year    Active Member of [employer] or Organizations: No    Attends Banker Meetings: Never    Marital Status: Living with partner  Depression (PHQ2-9): High Risk (09/13/2021)   Depression (PHQ2-9)    PHQ-2 Score: 22  Alcohol  Screen: High Risk (03/02/2024)   Alcohol Screen    Last Alcohol Screening Score (AUDIT): 17  Housing: High Risk (03/02/2024)   Epic    Unable to Pay for Housing in the Last Year: No    Number of Times Moved in the Last Year: 1    Homeless in the Last Year: Yes  Utilities: Not At Risk (03/02/2024)   Epic    Threatened with loss of utilities: No  Health Literacy: Not on file   Past Medical History:  Past Medical History:  Diagnosis Date   Alcoholism (HCC)    Anxiety    Chest pain    Cocaine abuse with cocaine-induced mood disorder (HCC) 11/11/2017   Depression    Pericarditis    Schizophrenia (HCC)    History reviewed. No pertinent surgical history.  Current Medications: Current Facility-Administered Medications  Medication Dose Route Frequency Provider Last Rate Last Admin   acetaminophen  (TYLENOL ) tablet 650 mg  650 mg Oral Q6H PRN McLauchlin, Angela, NP       alum & mag hydroxide-simeth (MAALOX/MYLANTA) 200-200-20 MG/5ML suspension 30 mL  30 mL Oral Q4H PRN McLauchlin, Angela, NP       bisacodyl  (DULCOLAX) EC tablet 10 mg  10 mg Oral Daily PRN Carrolyn Hilmes, MD       cariprazine  (VRAYLAR ) capsule 3 mg  3 mg Oral Daily McLauchlin, Angela, NP   3 mg at 03/05/24 9178   haloperidol  (HALDOL ) tablet 5 mg  5 mg Oral TID PRN McLauchlin, Angela, NP       And   diphenhydrAMINE  (BENADRYL ) capsule 50 mg  50 mg Oral TID PRN McLauchlin, Angela, NP       haloperidol  lactate (HALDOL ) injection 5 mg  5 mg Intramuscular TID PRN McLauchlin, Jon, NP       And   diphenhydrAMINE  (BENADRYL ) injection 50 mg  50 mg Intramuscular TID PRN McLauchlin, Angela, NP       And   LORazepam  (ATIVAN ) injection 2 mg  2 mg Intramuscular TID PRN McLauchlin, Angela, NP       haloperidol  lactate (HALDOL ) injection 10 mg  10 mg Intramuscular TID PRN McLauchlin, Angela, NP       And   diphenhydrAMINE  (BENADRYL ) injection 50 mg  50 mg Intramuscular TID PRN McLauchlin, Angela, NP       And   LORazepam  (ATIVAN )  injection 2 mg  2 mg Intramuscular TID PRN McLauchlin, Angela, NP       hydrOXYzine  (ATARAX ) tablet 25 mg  25 mg Oral TID PRN McLauchlin, Angela, NP   25 mg at 03/03/24 2113   lamoTRIgine  (LAMICTAL ) tablet 150 mg  150 mg Oral BID McLauchlin, Angela, NP   150 mg at 03/05/24 1712   magnesium  hydroxide (MILK OF MAGNESIA) suspension 30 mL  30 mL Oral Daily PRN McLauchlin, Angela, NP  nicotine  polacrilex (NICORETTE ) gum 2 mg  2 mg Oral PRN Broughton Eppinger, MD   2 mg at 03/05/24 2155   traZODone  (DESYREL ) tablet 50 mg  50 mg Oral QHS PRN McLauchlin, Jon, NP        Lab Results:  No results found for this or any previous visit (from the past 48 hours).   Blood Alcohol level:  Lab Results  Component Value Date   [facility] <15 03/01/2024   ETH <15 08/02/2023    Metabolic Disorder Labs: Lab Results  Component Value Date   HGBA1C 5.1 03/03/2024   MPG 99.67 03/03/2024   MPG 97 08/05/2023   No results found for: PROLACTIN Lab Results  Component Value Date   CHOL 171 03/03/2024   TRIG 171 (H) 03/03/2024   HDL 41 03/03/2024   CHOLHDL 4.2 03/03/2024   VLDL 34 03/03/2024   LDLCALC 96 03/03/2024   LDLCALC 77 08/05/2023    Physical Findings: AIMS:  , ,  ,  ,    CIWA:    COWS:      Psychiatric Specialty Exam:  Presentation  General Appearance:  Appropriate for Environment  Eye Contact: Fair  Speech: Clear and Coherent  Speech Volume: Normal    Mood and Affect  Mood: Fine Affect: Stable  Thought Process  Thought Processes: Coherent  Orientation:Full (Time, Place and Person)  Thought Content:Logical  Hallucinations: Denies  Ideas of Reference:None  Suicidal Thoughts: Denies  Homicidal Thoughts: Denies   Sensorium  Memory: Immediate Fair  Judgment: Impaired  Insight: Lacking   Art Therapist  Concentration: Fair  Attention Span: Fair  Recall: [employer] of Knowledge: Poor  Language: Fair   Psychomotor Activity   Psychomotor Activity: No data recorded  Musculoskeletal: Strength & Muscle Tone: within normal limits Gait & Station: normal Assets  Assets: Manufacturing Systems Engineer; Desire for Improvement; Resilience    Physical Exam: Physical Exam ROS Blood pressure 121/83, pulse 72, temperature 97.8 F (36.6 C), temperature source Oral, resp. rate 20, height 5' 8 (1.727 m), weight 61.7 kg, SpO2 100%. Body mass index is 20.68 kg/m.  Diagnosis: Active Problems:   MDD (major depressive disorder), recurrent severe, without psychosis (HCC)  Treatment Plan Summary:  Safety and Monitoring:             -- Voluntary admission to inpatient psychiatric unit for safety, stabilization and treatment             -- Daily contact with patient to assess and evaluate symptoms and progress in treatment             -- Patient's case to be discussed in multi-disciplinary team meeting             -- Observation Level: q15 minute checks             -- Vital signs:  q12 hours             -- Precautions: suicide, elopement, and assault   2. Psychiatric Diagnoses and Treatment:                Vraylar  3 mg and Lamictal  150 mg BID Patient is refusing any medication adjustments at this time   -- The risks/benefits/side-effects/alternatives to this medication were discussed in detail with the patient and time was given for questions. The patient consents to medication trial.                -- Metabolic profile and EKG monitoring obtained while on an  atypical antipsychotic (BMI: Lipid Panel: HbgA1c: QTc:)              -- Encouraged patient to participate in unit milieu and in scheduled group therapies                            3. Medical Issues Being Addressed:     No urgent medical needs identified at this time. 4. Discharge Planning:              -- Social work and case management to assist with discharge planning and identification of hospital follow-up needs prior to discharge             -- Estimated LOS:  5-7 days             -- Discharge Concerns: Need to establish a safety plan; Medication compliance and effectiveness             -- Discharge Goals: Return home with outpatient referrals follow ups  Allyn Foil, MD 03/05/2024, 10:29 PM

## 2024-03-05 NOTE — Progress Notes (Signed)
 Pt calm and pleasant during assessment denying SI/HI/AVH. Pt observed by this clinical research associate interacting appropriately with staff and peers on the unit. Pt didn't have any medications scheduled and hasn't requested anything PRN. Pt given education, support, and encouragement to be active in his treatment plan. Pt being monitored Q 15 minutes for safety per unit protocol, remains safe on the unit

## 2024-03-05 NOTE — Group Note (Signed)
 [facility] LCSW Group Therapy Note   Group Date: 03/05/2024 Start Time: 1300 End Time: 1400   Type of Therapy/Topic:  Group Therapy:  Balance in Life  Participation Level:  Did Not Attend   Description of Group:    This group will address the concept of balance and how it feels and looks when one is unbalanced. Patients will be encouraged to process areas in their lives that are out of balance, and identify reasons for remaining unbalanced. Facilitators will guide patients utilizing problem- solving interventions to address and correct the stressor making their life unbalanced. Understanding and applying boundaries will be explored and addressed for obtaining  and maintaining a balanced life. Patients will be encouraged to explore ways to assertively make their unbalanced needs known to significant others in their lives, using other group members and facilitator for support and feedback.  Therapeutic Goals: Patient will identify two or more emotions or situations they have that consume much of in their lives. Patient will identify signs/triggers that life has become out of balance:  Patient will identify two ways to set boundaries in order to achieve balance in their lives:  Patient will demonstrate ability to communicate their needs through discussion and/or role plays  Summary of Patient Progress:    X    Therapeutic Modalities:   Cognitive Behavioral Therapy Solution-Focused Therapy Assertiveness Training   Sherryle JINNY Margo, LCSW

## 2024-03-05 NOTE — Group Note (Signed)
 Recreation Therapy Group Note   Group Topic:Leisure Education  Group Date: 03/05/2024 Start Time: 1000 End Time: 1110 Facilitators: Celestia Jeoffrey BRAVO, LRT, CTRS Location: Craft Room  Group Description: Leisure. Patients were given the option to choose from journaling, coloring, drawing, making origami, playing with playdoh, listening to music or singing karaoke. LRT and pts discussed the meaning of leisure, the importance of participating in leisure during their free time/when they're outside of the hospital, as well as how our leisure interests can also serve as coping skills.   Goal Area(s) Addressed:  Patient will identify a current leisure interest.  Patient will learn the definition of leisure. Patient will practice making a positive decision. Patient will have the opportunity to try a new leisure activity. Patient will communicate with peers and LRT.    Affect/Mood: Appropriate   Participation Level: Active and Engaged   Participation Quality: Independent   Behavior: Calm and Cooperative   Speech/Thought Process: Coherent   Insight: Good   Judgement: Good   Modes of Intervention: Education, Exploration, Guided Discussion, Music, Rapport Building, and Socialization   Patient Response to Interventions:  Attentive, Engaged, and Interested    Education Outcome:  Acknowledges education   Clinical Observations/Individualized Feedback: Chris Olson was active in their participation of session activities and group discussion. Pt identified exercise, read and play on my tablet as things he does in his free time.    Plan: Continue to engage patient in RT group sessions 2-3x/week.   Jeoffrey BRAVO Celestia, LRT, CTRS 03/05/2024 11:18 AM

## 2024-03-05 NOTE — Group Note (Signed)
 Date:  03/05/2024 Time:  8:46 PM  Group Topic/Focus:  Dimensions of Wellness:   The focus of this group is to introduce the topic of wellness and discuss the role each dimension of wellness plays in total health. Self Care:   The focus of this group is to help patients understand the importance of self-care in order to improve or restore emotional, physical, spiritual, interpersonal, and financial health.    Participation Level:  Active  Participation Quality:  Appropriate  Affect:  Appropriate  Cognitive:  Appropriate  Insight: Appropriate  Engagement in Group:  Engaged  Modes of Intervention:  Discussion and Education  Additional Comments:    Colton Engdahl L 03/05/2024, 8:46 PM

## 2024-03-05 NOTE — [facility] Suicide Risk Assessment (Signed)
 [facility] INPATIENT:  Family/Significant Other Suicide Prevention Education  Suicide Prevention Education:  Contact Attempts: [name], partner, [phone], has been identified by the patient as the family member/significant other with whom the patient will be residing, and identified as the person(s) who will aid the patient in the event of a mental health crisis.  With written consent from the patient, two attempts were made to provide suicide prevention education, prior to and/or following the patient's discharge.  We were unsuccessful in providing suicide prevention education.  A suicide education pamphlet was given to the patient to share with family/significant other.  Date and time of first attempt: 03/06/23 at 8:50 AM  Date and time of second attempt: Second attempt is needed.   Chris Olson 03/05/2024, 8:50 AM

## 2024-03-05 NOTE — Plan of Care (Signed)

## 2024-03-05 NOTE — Group Note (Signed)
 Recreation Therapy Group Note   Group Topic:Health and Wellness  Group Date: 03/05/2024 Start Time: 1525 End Time: 1605 Facilitators: Celestia Jeoffrey BRAVO, LRT, CTRS Location: Courtyard  Group Description: [employer]. LRT and patients played games of basketball, drew with chalk, and played corn hole while outside in the courtyard while getting fresh air and sunlight. Music was being played in the background. LRT and peers conversed about different games they have played before, what they do in their free time and anything else that is on their minds. LRT encouraged pts to drink water after being outside, sweating and getting their heart rate up.  Goal Area(s) Addressed: Patient will build on frustration tolerance skills. Patients will partake in a competitive play game with peers. Patients will gain knowledge of new leisure interest/hobby.    Affect/Mood: N/A   Participation Level: Did not attend    Clinical Observations/Individualized Feedback: Patient did not attend.  Plan: Continue to engage patient in RT group sessions 2-3x/week.   Jeoffrey BRAVO Celestia, LRT, CTRS 03/05/2024 4:55 PM

## 2024-03-05 NOTE — Progress Notes (Signed)
 Patient remains cooperative and med compliant and states he is ready for discharge on Saturday. Patient states he hopes he can leave before 1pm so that his ride is able to pick him up. Patient denies any further concerns.     03/05/24 0821  Psych Admission Type (Psych Patients Only)  Admission Status Voluntary  Psychosocial Assessment  Patient Complaints None  Eye Contact Fair  Facial Expression Flat  Affect Appropriate to circumstance  Speech Logical/coherent  Interaction Assertive  Motor Activity Slow  Appearance/Hygiene Unremarkable  Behavior Characteristics Cooperative;Appropriate to situation  Mood Pleasant  Thought Process  Coherency WDL  Content WDL  Delusions None reported or observed  Perception WDL  Hallucination None reported or observed  Judgment WDL  Confusion None  Danger to Self  Current suicidal ideation? Denies  Self-Injurious Behavior No self-injurious ideation or behavior indicators observed or expressed   Agreement Not to Harm Self Yes  Description of Agreement verbal  Danger to Others  Danger to Others None reported or observed

## 2024-03-05 NOTE — Plan of Care (Signed)
   Problem: Education: Goal: Emotional status will improve Outcome: Progressing Goal: Mental status will improve Outcome: Progressing

## 2024-03-06 ENCOUNTER — Other Ambulatory Visit: Payer: Self-pay

## 2024-03-06 DIAGNOSIS — F332 Major depressive disorder, recurrent severe without psychotic features: Secondary | ICD-10-CM | POA: Diagnosis not present

## 2024-03-06 MED ORDER — TRAZODONE HCL 50 MG PO TABS: 50.0000 mg | ORAL_TABLET | Freq: Every evening | ORAL | 0 refills | Status: DC | PRN

## 2024-03-06 MED ORDER — NICOTINE POLACRILEX 2 MG MT GUM: 2.0000 mg | CHEWING_GUM | OROMUCOSAL | 0 refills | Status: AC | PRN

## 2024-03-06 MED ORDER — CARIPRAZINE HCL 3 MG PO CAPS: 3.0000 mg | ORAL_CAPSULE | Freq: Every day | ORAL | 0 refills | Status: DC

## 2024-03-06 MED ORDER — LAMOTRIGINE 150 MG PO TABS
150.0000 mg | ORAL_TABLET | Freq: Two times a day (BID) | ORAL | 0 refills | Status: DC
  Filled 2024-03-06: qty 60, 30d supply, fill #0

## 2024-03-06 MED ORDER — TRAZODONE HCL 50 MG PO TABS
50.0000 mg | ORAL_TABLET | Freq: Every evening | ORAL | 0 refills | Status: DC | PRN
  Filled 2024-03-06: qty 30, 30d supply, fill #0

## 2024-03-06 MED ORDER — LAMOTRIGINE 150 MG PO TABS: 150.0000 mg | ORAL_TABLET | Freq: Two times a day (BID) | ORAL | 0 refills | Status: DC

## 2024-03-06 MED ORDER — NICOTINE POLACRILEX 2 MG MT GUM
2.0000 mg | CHEWING_GUM | OROMUCOSAL | 0 refills | Status: DC | PRN
  Filled 2024-03-06: qty 100, 30d supply, fill #0

## 2024-03-06 MED ORDER — CARIPRAZINE HCL 3 MG PO CAPS
3.0000 mg | ORAL_CAPSULE | Freq: Every day | ORAL | 0 refills | Status: DC
  Filled 2024-03-06: qty 30, 30d supply, fill #0

## 2024-03-06 NOTE — [facility] Counselor (Signed)
 CSW was unable to establish successful contact after multiple attempts. A HIPAA-compliant voicemail was left. The patient was informed of the CSWs contact attempts. CSW will make additional attempts at the teams request or if any safety concerns arise and will attempt contact again on the day of discharge.   Ranson Belluomini, MSW, LCSWA 03/06/2024 9:45 AM

## 2024-03-06 NOTE — Group Note (Signed)
 Date:  03/06/2024 Time:  8:59 PM  Group Topic/Focus:  Conflict Resolution:   The focus of this group is to discuss the conflict resolution process and how it may be used upon discharge.    Participation Level:  Active  Participation Quality:  Appropriate  Affect:  Appropriate  Cognitive:  Appropriate  Insight: Appropriate  Engagement in Group:  Engaged  Modes of Intervention:  Discussion and Education  Additional Comments:    Chris Olson L 03/06/2024, 8:59 PM

## 2024-03-06 NOTE — [facility] Suicide Risk Assessment (Signed)
 [facility] INPATIENT:  Family/Significant Other Suicide Prevention Education  Suicide Prevention Education: Contact Attempts: [name], partner, [phone], has been identified by the patient as the family member/significant other with whom the patient will be residing, and identified as the person(s) who will aid the patient in the event of a mental health crisis.  With written consent from the patient, two attempts were made to provide suicide prevention education, prior to and/or following the patient's discharge.  We were unsuccessful in providing suicide prevention education.  A suicide education pamphlet was given to the patient to share with family/significant other.   Date and time of first attempt: 03/06/23 at 8:50 AM   Date and time of second attempt: 03/07/23 at 9:46 AM  CSW was unable to establish successful contact after multiple attempts. A HIPAA-compliant voicemail was left. The patient was informed of the CSWs contact attempts. CSW will make additional attempts at the teams request or if any safety concerns arise and will attempt contact again on the day of discharge.      Chris Olson M [name] 03/06/2024, 9:46 AM

## 2024-03-06 NOTE — Progress Notes (Signed)
" °   03/06/24 0343  Psych Admission Type (Psych Patients Only)  Admission Status Voluntary  Psychosocial Assessment  Patient Complaints None  Eye Contact Fair  Facial Expression Flat  Affect Appropriate to circumstance  Speech Logical/coherent  Interaction Assertive  Motor Activity Other (Comment) (WDL)  Appearance/Hygiene Unremarkable  Behavior Characteristics Cooperative;Appropriate to situation  Mood Pleasant  Aggressive Behavior  Effect No apparent injury  Thought Process  Coherency WDL  Content WDL  Delusions None reported or observed  Perception WDL  Hallucination None reported or observed  Judgment WDL  Confusion None  Danger to Self  Current suicidal ideation? Denies  Self-Injurious Behavior No self-injurious ideation or behavior indicators observed or expressed   Agreement Not to Harm Self Yes  Description of Agreement Verbl  Danger to Others  Danger to Others None reported or observed    "

## 2024-03-06 NOTE — Plan of Care (Signed)

## 2024-03-06 NOTE — [facility] Counselor (Signed)
 Suicide prevention education completed via phone with [name], partner, [phone].   According to girlfriend, the patient will black out and just start tripping. She reported that during these times, depending how bad it is, he will either turn himself in or the police will come. Girlfriend reported that the patient does not have access to any weapons and that at discharge, he can return to the daughter's home where they are residing for a few. Girlfriend confirmed discharge address with CSW as [address] [place], [place] [address]. Girlfriend reported that she does not believe the patient to be a danger to himself or others and reported no current safety concerns.  Brezlyn Manrique, MSW, LCSWA 03/06/2024 11:07 AM

## 2024-03-06 NOTE — Group Note (Signed)
 Date:  03/06/2024 Time:  9:54 AM  Group Topic/Focus:  Goals Group:   The focus of this group is to help patients establish daily goals to achieve during treatment and discuss how the patient can incorporate goal setting into their daily lives to aide in recovery.    Participation Level:  Did Not Attend   Chris Olson June 03/06/2024, 9:54 AM

## 2024-03-06 NOTE — [facility] Counselor (Signed)
 CSW has reached out to [facility] to schedule patients appointment.   Appointment was scheduled.  [facility] staff report that appointment will be scheduled with the patient's primary therapist, however, if therapist is unavailable the patient will be scheduled with a hospital discharge staff.   CSW also informed of pt's desire for an ACTT team.  Merrily reports that due to pt being current patient, online referral is NOT needed, however, pt will need to inform his therapist of the request.    Merrily also noted in chart that pt would like to be considered for ACTT.   Jannell Margo, MSW, LCSW 03/06/2024 4:08 PM

## 2024-03-06 NOTE — [facility] Suicide Risk Assessment (Signed)
 [facility] INPATIENT:  Family/Significant Other Suicide Prevention Education  Suicide Prevention Education:  Education Completed; [name], partner, [phone],  has been identified by the patient as the family member/significant other with whom the patient will be residing, and identified as the person(s) who will aid the patient in the event of a mental health crisis (suicidal ideations/suicide attempt).  With written consent from the patient, the family member/significant other has been provided the following suicide prevention education, prior to the and/or following the discharge of the patient.  The suicide prevention education provided includes the following: Suicide risk factors Suicide prevention and interventions National Suicide Hotline telephone number [facility] assessment telephone number [facility] Emergency Assistance 911 [facility] and/or Residential Mobile Crisis Unit telephone number  Request made of family/significant other to: Remove weapons (e.g., guns, rifles, knives), all items previously/currently identified as safety concern.   Remove drugs/medications (over-the-counter, prescriptions, illicit drugs), all items previously/currently identified as a safety concern.  The family member/significant other verbalizes understanding of the suicide prevention education information provided.  The family member/significant other agrees to remove the items of safety concern listed above.  According to girlfriend, the patient will black out and just start tripping. She reported that during these times, depending how bad it is, he will either turn himself in or the police will come. Girlfriend reported that the patient does not have access to any weapons and that at discharge, he can return to the daughter's home where they are residing for a few. Girlfriend confirmed discharge address with CSW as [address] [place], [place] [address]. Girlfriend reported that she  does not believe the patient to be a danger to himself or others and reported no current safety concerns.  Yeva Bissette M Evalee Gerard 03/06/2024, 11:07 AM

## 2024-03-06 NOTE — Progress Notes (Signed)
" °   03/06/24 0910  Psych Admission Type (Psych Patients Only)  Admission Status Voluntary  Psychosocial Assessment  Patient Complaints None  Eye Contact Fair  Facial Expression Flat  Affect Appropriate to circumstance  Speech Logical/coherent  Interaction Minimal  Motor Activity Other (Comment) (WDL)  Appearance/Hygiene Unremarkable  Behavior Characteristics Cooperative;Appropriate to situation;Calm  Mood Pleasant  Thought Process  Coherency WDL  Content WDL  Delusions None reported or observed  Perception WDL  Hallucination None reported or observed  Judgment WDL  Confusion None  Danger to Self  Current suicidal ideation? Denies  Agreement Not to Harm Self Yes  Description of Agreement verbal  Danger to Others  Danger to Others None reported or observed   D- Patient alert and oriented x 4. Pt presents with a pleasant mood and affect and slept 4-5 hrs per pt. Pt initially flat at start of day. Pt denies SI, HI, AVH, and pain. Pt states his goal is to be discharged. Pt expected to discharge tomorrow.   A- Scheduled medications administered to patient, per MD orders. Support and encouragement provided.  Routine safety checks conducted every 15 minutes.  Patient informed to notify staff with problems or concerns.  R- No adverse drug reactions noted. Patient contracts for safety at this time. Patient compliant with medications and treatment plan. Patient receptive, calm, and cooperative. Patient interacts well with others on the unit.  Patient remains safe at this time.   "

## 2024-03-06 NOTE — Plan of Care (Signed)
   Problem: Education: Goal: Emotional status will improve Outcome: Progressing Goal: Mental status will improve Outcome: Progressing   Problem: Activity: Goal: Interest or engagement in activities will improve Outcome: Progressing Goal: Sleeping patterns will improve Outcome: Progressing
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
## 2024-03-07 DIAGNOSIS — F332 Major depressive disorder, recurrent severe without psychotic features: Secondary | ICD-10-CM | POA: Diagnosis not present

## 2024-03-07 MED ORDER — HYDROXYZINE HCL 25 MG PO TABS: 25.0000 mg | ORAL_TABLET | Freq: Three times a day (TID) | ORAL | 0 refills | Status: DC | PRN

## 2024-03-07 NOTE — Discharge Summary (Signed)
 " Physician Discharge Summary Note  Patient:  Chris Olson. is an 34 y.o., male MRN:  [id number] DOB:  1991/02/21 Patient phone:  [phone] (home)  Patient address:   [address] [place] [address],   Total time spent: 40 min Date of Admission:  03/02/2024 Date of Discharge: 03/07/24  Reason for Admission:  Chris Olson. is a 34 year old, English speaking, Black male. Pt presented to [facility] ED voluntarily. Per triage note: Pt presents via POV c/o body aches, generalized weakness, and cough, reports symptoms have been ongoing for a year. Also reports he is suicidal. Reports I'm tired of feeling like I don't matter. Reports have no plan to hurt himself at current moment but reports has had plans in the past.  On assessment, the patient was visibly depressed. Pt was forthcoming when asked assessment questions. With encouragement, pt. began explaining that he has ongoing relationship issues. Pt identified having multiple life stressors including worsening thoughts of SI, paranoia, and feelings of hopelessness. Pt reported that he often questions what's the point of him being here. Pt reported having chronic depression and issues with sleep disturbance, explaining that he can't shut his mind off.Patient is admitted to adult  psych unit with Q15 min safety monitoring. Multidisciplinary team approach is offered. Medication management; group/milieu therapy is offered.   Principal Problem: <principal problem not specified> Discharge Diagnoses: Active Problems:   MDD (major depressive disorder), recurrent severe, without psychosis (HCC)   Past Psychiatric History: see h&p  Family Psychiatric  History: see h&p Social History:  Social History   Substance and Sexual Activity  Alcohol Use Yes   Alcohol/week: 15.0 standard drinks of alcohol   Types: 15 Cans of beer per week   Comment: (2) 40 oz beers per day most days     Social History   Substance and Sexual Activity  Drug  Use Yes   Types: Marijuana, Cocaine    Social History   Socioeconomic History   Marital status: Divorced    Spouse name: Not on file   Number of children: 1   Years of education: Not on file   Highest education level: Not on file  Occupational History   Not on file  Tobacco Use   Smoking status: Every Day    Current packs/day: 1.00    Types: Cigarettes   Smokeless tobacco: Never  Vaping Use   Vaping status: Never Used  Substance and Sexual Activity   Alcohol use: Yes    Alcohol/week: 15.0 standard drinks of alcohol    Types: 15 Cans of beer per week    Comment: (2) 40 oz beers per day most days   Drug use: Yes    Types: Marijuana, Cocaine   Sexual activity: Not Currently  Other Topics Concern   Not on file  Social History Narrative   Not on file   Social Drivers of Health   Tobacco Use: High Risk (03/02/2024)   Patient History    Smoking Tobacco Use: Every Day    Smokeless Tobacco Use: Never    Passive Exposure: Not on file  Financial Resource Strain: Not on file  Food Insecurity: No Food Insecurity (03/02/2024)   Epic    Worried About Programme Researcher, Broadcasting/film/video in the Last Year: Never true    Ran Out of Food in the Last Year: Never true  Transportation Needs: Unmet Transportation Needs (03/02/2024)   Epic    Lack of Transportation (Medical): Yes    Lack of Transportation (Non-Medical): Yes  Physical Activity: Not on file  Stress: Not on file  Social Connections: Moderately Isolated (03/02/2024)   Social Connection and Isolation Panel    Frequency of Communication with Friends and Family: Once a week    Frequency of Social Gatherings with Friends and Family: Once a week    Attends Religious Services: More than 4 times per year    Active Member of [employer] or Organizations: No    Attends Banker Meetings: Never    Marital Status: Living with partner  Depression (PHQ2-9): High Risk (09/13/2021)   Depression (PHQ2-9)    PHQ-2 Score: 22  Alcohol Screen: High Risk  (03/02/2024)   Alcohol Screen    Last Alcohol Screening Score (AUDIT): 17  Housing: High Risk (03/02/2024)   Epic    Unable to Pay for Housing in the Last Year: No    Number of Times Moved in the Last Year: 1    Homeless in the Last Year: Yes  Utilities: Not At Risk (03/02/2024)   Epic    Threatened with loss of utilities: No  Health Literacy: Not on file   Past Medical History:  Past Medical History:  Diagnosis Date   Alcoholism (HCC)    Anxiety    Chest pain    Cocaine abuse with cocaine-induced mood disorder (HCC) 11/11/2017   Depression    Pericarditis    Schizophrenia (HCC)    History reviewed. No pertinent surgical history. Family History: History reviewed. No pertinent family history.  Hospital Course:  Chris Olson. is a 34 year old, English speaking, Black male. Pt presented to [facility] ED voluntarily. Per triage note: Pt presents via POV c/o body aches, generalized weakness, and cough, reports symptoms have been ongoing for a year. Also reports he is suicidal. Reports I'm tired of feeling like I don't matter. Reports have no plan to hurt himself at current moment but reports has had plans in the past.  On assessment, the patient was visibly depressed. Pt was forthcoming when asked assessment questions. With encouragement, pt. began explaining that he has ongoing relationship issues. Pt identified having multiple life stressors including worsening thoughts of SI, paranoia, and feelings of hopelessness. Pt reported that he often questions what's the point of him being here. Pt reported having chronic depression and issues with sleep disturbance, explaining that he can't shut his mind off.Patient is admitted to adult  psych unit with Q15 min safety monitoring. Multidisciplinary team approach is offered. Medication management; group/milieu therapy is offered.   On admission, patient was restarted on Vraylar  3 mg daily and Lamictal  150 mg twice daily which are his home medications.   Patient tolerated medications very well with no reported side effects.  Patient displayed significant improvement in his mood and denies any depression or anxiety on the day of discharge.  His girlfriend supported him during the treatment and he was discharged home with her.  Detailed risk assessment is complete based on clinical exam and individual risk factors and acute suicide risk is low and acute violence risk is low.    On the day of discharge, patient denies SI/HI/plan and denies hallucinations.  Patient remains future oriented and is willing to participate in outpatient mental health services.  Currently, all modifiable risk of harm to self/harm to others have been addressed and patient is no longer appropriate for the acute inpatient setting and is able to continue treatment for mental health needs in the community with the supports as indicated below.  Patient is educated and verbalized  understanding of discharge plan of care including medications, follow-up appointments, mental health resources and further crisis services in the community.  He is instructed to call 911 or present to the nearest emergency room should he experience any decompensation in mood, disturbance of bowel or return of suicidal/homicidal ideations.  Patient verbalizes understanding of this education and agrees to this plan of care  Physical Findings: AIMS:  , ,  ,  ,    CIWA:    COWS:      Psychiatric Specialty Exam:  Presentation  General Appearance:  Appropriate for Environment  Eye Contact: Fair  Speech: Clear and Coherent  Speech Volume: Normal    Mood and Affect  Mood: Euthymic  Affect: Appropriate   Thought Process  Thought Processes: Coherent  Descriptions of Associations:Intact  Orientation:Full (Time, Place and Person)  Thought Content:Logical  Hallucinations:Hallucinations: None  Ideas of Reference:None  Suicidal Thoughts:Suicidal Thoughts: No  Homicidal  Thoughts:Homicidal Thoughts: No   Sensorium  Memory: Immediate Fair  Judgment: Fair  Insight: Fair   Art Therapist  Concentration: Fair  Attention Span: Fair  Recall: Fair  Fund of Knowledge: Fair  Language: Fair   Psychomotor Activity  Psychomotor Activity: Psychomotor Activity: Normal  Musculoskeletal: Strength & Muscle Tone: within normal limits Gait & Station: normal Assets  Assets: Manufacturing Systems Engineer; Desire for Improvement   Sleep  Sleep: Sleep: Fair    Physical Exam: Physical Exam ROS Blood pressure 118/81, pulse 85, temperature 97.8 F (36.6 C), temperature source Oral, resp. rate 16, height 5' 8 (1.727 m), weight 61.7 kg, SpO2 100%. Body mass index is 20.68 kg/m.   Tobacco Use History[1] Tobacco Cessation:  N/A, patient does not currently use tobacco products   Blood Alcohol level:  Lab Results  Component Value Date   [facility] <15 03/01/2024   ETH <15 08/02/2023    Metabolic Disorder Labs:  Lab Results  Component Value Date   HGBA1C 5.1 03/03/2024   MPG 99.67 03/03/2024   MPG 97 08/05/2023   No results found for: PROLACTIN Lab Results  Component Value Date   CHOL 171 03/03/2024   TRIG 171 (H) 03/03/2024   HDL 41 03/03/2024   CHOLHDL 4.2 03/03/2024   VLDL 34 03/03/2024   LDLCALC 96 03/03/2024   LDLCALC 77 08/05/2023    See Psychiatric Specialty Exam and Suicide Risk Assessment completed by Attending Physician prior to discharge.  Discharge destination:  Home  Is patient on multiple antipsychotic therapies at discharge:  No   Has Patient had three or more failed trials of antipsychotic monotherapy by history:  No  Recommended Plan for Multiple Antipsychotic Therapies: NA  Discharge Instructions     Increase activity slowly   Complete by: As directed       Allergies as of 03/07/2024   No Known Allergies      Medication List     STOP taking these medications    hydrOXYzine  25 MG  tablet Commonly known as: ATARAX    melatonin 5 MG Tabs   nystatin -triamcinolone  ointment Commonly known as: MYCOLOG   polyethylene glycol 17 g packet Commonly known as: MIRALAX  / GLYCOLAX        TAKE these medications      Indication  cariprazine  3 MG capsule Commonly known as: Vraylar  Take 1 capsule (3 mg total) by mouth daily.  Indication: Major Depressive Disorder   lamoTRIgine  150 MG tablet Commonly known as: LAMICTAL  Take 1 tablet (150 mg total) by mouth 2 (two) times daily.  Indication: Manic-Depression, Mood Stability  nicotine  polacrilex 2 MG gum Commonly known as: NICORETTE  Take 1 each (2 mg total) by mouth as needed for smoking cessation.  Indication: Nicotine  Addiction   omeprazole  20 MG capsule Commonly known as: PRILOSEC Take 1 capsule (20 mg total) by mouth daily.  Indication: Heartburn   traZODone  50 MG tablet Commonly known as: DESYREL  Take 1 tablet (50 mg total) by mouth at bedtime as needed for sleep.  Indication: Trouble Sleeping        Follow-up Information     [facility] Follow up.   Why: Appointment is scheduled for 03/11/24 at 1pm.  Appointment is virtual.  You have referred for ACTT services. Please follow up at discharge. Contact information: [address]  [address] [place] [address] [phone]                 Follow-up recommendations:  Activity:  as tolerated    Signed: Ilario Dhaliwal, MD 03/07/2024, 12:20 PM          [1]  Social History Tobacco Use  Smoking Status Every Day   Current packs/day: 1.00   Types: Cigarettes  Smokeless Tobacco Never   "

## 2024-03-07 NOTE — Progress Notes (Addendum)
 [facility] MD Progress Note  03/07/2024 12:24 PM Chris Olson.  MRN:  [id number]  Subjective:  Chart reviewed, case discussed in multidisciplinary meeting, patient seen during rounds.   03/07/2024: Patient was seen this morning in the dayroom for psychiatric reassessment during rounds. Patient is alert and oriented X 4, calm, pleasant, cooperative, and engaged well in the interview. Patient endorses improved depression and anxiety symptoms, currently rates depression and anxiety a 1-2 out of 10. He relates experiencing good efficacy with his medication; expresses, I feel comfortable with it since I take it from [place]. Patient reports that he is eating and sleeping well, about 5 to 6 hours at night. Patient denies having any adverse reactions to his medications, suicidal-homicidal ideations or perceptual disturbances. He reports, I will be happy to see children and family. Patient demonstrates sound psychological stability, insight, judgement, and cognition. He is psychiatrically appropriate for discharge and will be discharged to home today.    03/06/2024: Patient is noted to be outdoors playing basketball.  He reports feeling better.  Patient and provider discussed the discharge planning for tomorrow.  Patient requested for medications to be filled at the local pharmacy so that she has medications handy.  Patient denies SI/HI/plan and denies hallucinations.  Patient reports fair appetite and sleep and denies any side effects to medications. 03/05/2024 patient is noted to be sleeping in his room.  He offers no complaints.  Per social work team patient does not want to go back to his boardinghouse and will be going with his girlfriend.  He denies SI/HI/plan at.  He denies having any conflict with his girlfriend.  He denies auditory/visual hallucinations.  Past Psychiatric History: see h&P Family History: History reviewed. No pertinent family history. Social History:  Social History   Substance and  Sexual Activity  Alcohol Use Yes   Alcohol/week: 15.0 standard drinks of alcohol   Types: 15 Cans of beer per week   Comment: (2) 40 oz beers per day most days     Social History   Substance and Sexual Activity  Drug Use Yes   Types: Marijuana, Cocaine    Social History   Socioeconomic History   Marital status: Divorced    Spouse name: Not on file   Number of children: 1   Years of education: Not on file   Highest education level: Not on file  Occupational History   Not on file  Tobacco Use   Smoking status: Every Day    Current packs/day: 1.00    Types: Cigarettes   Smokeless tobacco: Never  Vaping Use   Vaping status: Never Used  Substance and Sexual Activity   Alcohol use: Yes    Alcohol/week: 15.0 standard drinks of alcohol    Types: 15 Cans of beer per week    Comment: (2) 40 oz beers per day most days   Drug use: Yes    Types: Marijuana, Cocaine   Sexual activity: Not Currently  Other Topics Concern   Not on file  Social History Narrative   Not on file   Social Drivers of Health   Tobacco Use: High Risk (03/02/2024)   Patient History    Smoking Tobacco Use: Every Day    Smokeless Tobacco Use: Never    Passive Exposure: Not on file  Financial Resource Strain: Not on file  Food Insecurity: No Food Insecurity (03/02/2024)   Epic    Worried About Radiation Protection Practitioner of Food in the Last Year: Never true    Ran  Out of Food in the Last Year: Never true  Transportation Needs: Unmet Transportation Needs (03/02/2024)   Epic    Lack of Transportation (Medical): Yes    Lack of Transportation (Non-Medical): Yes  Physical Activity: Not on file  Stress: Not on file  Social Connections: Moderately Isolated (03/02/2024)   Social Connection and Isolation Panel    Frequency of Communication with Friends and Family: Once a week    Frequency of Social Gatherings with Friends and Family: Once a week    Attends Religious Services: More than 4 times per year    Active Member of [employer]  or Organizations: No    Attends Banker Meetings: Never    Marital Status: Living with partner  Depression (PHQ2-9): High Risk (09/13/2021)   Depression (PHQ2-9)    PHQ-2 Score: 22  Alcohol Screen: High Risk (03/02/2024)   Alcohol Screen    Last Alcohol Screening Score (AUDIT): 17  Housing: High Risk (03/02/2024)   Epic    Unable to Pay for Housing in the Last Year: No    Number of Times Moved in the Last Year: 1    Homeless in the Last Year: Yes  Utilities: Not At Risk (03/02/2024)   Epic    Threatened with loss of utilities: No  Health Literacy: Not on file   Past Medical History:  Past Medical History:  Diagnosis Date   Alcoholism (HCC)    Anxiety    Chest pain    Cocaine abuse with cocaine-induced mood disorder (HCC) 11/11/2017   Depression    Pericarditis    Schizophrenia (HCC)    History reviewed. No pertinent surgical history.  Current Medications: Current Facility-Administered Medications  Medication Dose Route Frequency Provider Last Rate Last Admin   acetaminophen  (TYLENOL ) tablet 650 mg  650 mg Oral Q6H PRN McLauchlin, Angela, NP       alum & mag hydroxide-simeth (MAALOX/MYLANTA) 200-200-20 MG/5ML suspension 30 mL  30 mL Oral Q4H PRN McLauchlin, Angela, NP       bisacodyl  (DULCOLAX) EC tablet 10 mg  10 mg Oral Daily PRN Jadapalle, Sree, MD       cariprazine  (VRAYLAR ) capsule 3 mg  3 mg Oral Daily McLauchlin, Angela, NP   3 mg at 03/07/24 9178   haloperidol  (HALDOL ) tablet 5 mg  5 mg Oral TID PRN McLauchlin, Angela, NP       And   diphenhydrAMINE  (BENADRYL ) capsule 50 mg  50 mg Oral TID PRN McLauchlin, Angela, NP       haloperidol  lactate (HALDOL ) injection 5 mg  5 mg Intramuscular TID PRN McLauchlin, Jon, NP       And   diphenhydrAMINE  (BENADRYL ) injection 50 mg  50 mg Intramuscular TID PRN McLauchlin, Jon, NP       And   LORazepam  (ATIVAN ) injection 2 mg  2 mg Intramuscular TID PRN McLauchlin, Jon, NP       haloperidol  lactate (HALDOL )  injection 10 mg  10 mg Intramuscular TID PRN McLauchlin, Angela, NP       And   diphenhydrAMINE  (BENADRYL ) injection 50 mg  50 mg Intramuscular TID PRN McLauchlin, Angela, NP       And   LORazepam  (ATIVAN ) injection 2 mg  2 mg Intramuscular TID PRN McLauchlin, Angela, NP       hydrOXYzine  (ATARAX ) tablet 25 mg  25 mg Oral TID PRN McLauchlin, Angela, NP   25 mg at 03/06/24 2146   lamoTRIgine  (LAMICTAL ) tablet 150 mg  150 mg Oral  BID McLauchlin, Angela, NP   150 mg at 03/07/24 9178   magnesium  hydroxide (MILK OF MAGNESIA) suspension 30 mL  30 mL Oral Daily PRN McLauchlin, Angela, NP       nicotine  polacrilex (NICORETTE ) gum 2 mg  2 mg Oral PRN Jadapalle, Sree, MD   2 mg at 03/07/24 9178   traZODone  (DESYREL ) tablet 50 mg  50 mg Oral QHS PRN McLauchlin, Jon, NP        Lab Results:  No results found for this or any previous visit (from the past 48 hours).   Blood Alcohol level:  Lab Results  Component Value Date   [facility] <15 03/01/2024   ETH <15 08/02/2023    Metabolic Disorder Labs: Lab Results  Component Value Date   HGBA1C 5.1 03/03/2024   MPG 99.67 03/03/2024   MPG 97 08/05/2023   No results found for: PROLACTIN Lab Results  Component Value Date   CHOL 171 03/03/2024   TRIG 171 (H) 03/03/2024   HDL 41 03/03/2024   CHOLHDL 4.2 03/03/2024   VLDL 34 03/03/2024   LDLCALC 96 03/03/2024   LDLCALC 77 08/05/2023    Physical Findings: AIMS:  , ,  ,  ,    CIWA:    COWS:      Psychiatric Specialty Exam:  Presentation  General Appearance:  Appropriate for Environment  Eye Contact: Good  Speech: Clear and Coherent  Speech Volume: Normal    Mood and Affect  Mood: Stable Affect: Stable  Thought Process  Thought Processes: Coherent  Orientation:Full (Time, Place and Person)  Thought Content:Logical  Hallucinations: Denies  Ideas of Reference:None  Suicidal Thoughts: Denies  Homicidal Thoughts: Denies   Sensorium   Memory: Good  Judgment: Good  Insight: Good   Executive Functions  Concentration: Good  Attention Span: Good  Recall: Chris Olson of Knowledge: Fair  Language: Fair   Psychomotor Activity  Psychomotor Activity: Psychomotor Activity: Normal   Musculoskeletal: Strength & Muscle Tone: within normal limits Gait & Station: normal Assets  Assets: Manufacturing Systems Engineer; Desire for Improvement    Physical Exam: Physical Exam ROS Blood pressure 118/81, pulse 85, temperature 97.8 F (36.6 C), temperature source Oral, resp. rate 16, height 5' 8 (1.727 m), weight 61.7 kg, SpO2 100%. Body mass index is 20.68 kg/m.  Diagnosis: Active Problems:   MDD (major depressive disorder), recurrent severe, without psychosis (HCC)  Treatment Plan Summary:  Safety and Monitoring:             -- Voluntary admission to inpatient psychiatric unit for safety, stabilization and treatment             -- Daily contact with patient to assess and evaluate symptoms and progress in treatment             -- Patient's case to be discussed in multi-disciplinary team meeting             -- Observation Level: q15 minute checks             -- Vital signs:  q12 hours             -- Precautions: suicide, elopement, and assault   2. Psychiatric Diagnoses and Treatment:                Vraylar  3 mg and Lamictal  150 mg BID  -- The risks/benefits/side-effects/alternatives to this medication were discussed in detail with the patient and time was given for questions. The patient consents to medication trial.                --  Metabolic profile and EKG monitoring obtained while on an atypical antipsychotic (BMI: Lipid Panel: HbgA1c: QTc:)              -- Encouraged patient to participate in unit milieu and in scheduled group therapies                            3. Medical Issues Being Addressed:     No urgent medical needs identified at this time. 4. Discharge Planning:              -- Social  work and case management completed discharge planning and identification of hospital follow-up needs prior to discharge             -- Estimated LOS: 5-7 days             -- Discharge Concerns: Need to establish a safety plan; Medication compliance and effectiveness             -- Discharge Goals: Return home with outpatient referrals follow ups and medication prescriptions  provided  Camelia JINNY Mountain, NP 03/07/2024, 12:24 PM

## 2024-03-07 NOTE — Plan of Care (Signed)
  Problem: Education: Goal: Knowledge of [facility] Education information/materials will improve Outcome: Progressing   Problem: Education: Goal: Emotional status will improve Outcome: Progressing   Problem: Education: Goal: Mental status will improve Outcome: Progressing   

## 2024-03-07 NOTE — Progress Notes (Addendum)
" °  [facility] Adult Case Management Discharge Plan :  Will you be returning to the same living situation after discharge:  Yes,  Girlfriend At discharge, do you have transportation home?: Yes,  Girlfriend Do you have the ability to pay for your medications: Yes,  [place], [employer]   Release of information consent forms completed and in the chart;  Patient's signature needed at discharge.  Patient to Follow up at:  Follow-up Information     [facility] Follow up.   Why: Appointment is scheduled for 03/11/24 at 1pm.  Appointment is virtual. Contact information: [address]  [address] [place] [address] [phone]                 Next level of care provider has access to [facility] Link:yes  Safety Planning and Suicide Prevention discussed: Yes,  [name], partner, [phone],     Has patient been referred to the Quitline?: Patient refused referral for treatment  Patient has been referred for addiction treatment: Yes, the patient will follow up with an outpatient provider for substance use disorder. Psychiatrist/APP: appointment made  Pamila Nine, LCSW 03/07/2024, 9:05 AM "

## 2024-03-07 NOTE — Progress Notes (Signed)
" °   03/06/24 2000  Psych Admission Type (Psych Patients Only)  Admission Status Voluntary  Psychosocial Assessment  Patient Complaints None  Eye Contact Fair  Facial Expression Flat  Affect Appropriate to circumstance  Speech Logical/coherent  Interaction Minimal  Motor Activity Other (Comment) (appropriate)  Appearance/Hygiene Improved  Behavior Characteristics Cooperative;Appropriate to situation;Calm  Mood Pleasant  Thought Process  Coherency WDL  Content WDL  Delusions None reported or observed  Perception WDL  Hallucination None reported or observed  Judgment WDL  Confusion None  Danger to Self  Current suicidal ideation? Denies  Agreement Not to Harm Self Yes  Description of Agreement verbal  Danger to Others  Danger to Others None reported or observed   Patient is alert and oriented x 4, he denies SI/HI/AVH, patient's thoughts are organized and coherent, his thoughts are linear, he rated depression a 2 (0 low - high 10 ) patient was offered emotional support and encouragement, 15 minutes safety checks maintained.  "

## 2024-03-07 NOTE — [facility] Suicide Risk Assessment (Signed)
 [facility] Discharge Suicide Risk Assessment   Principal Problem: <principal problem not specified> Discharge Diagnoses: Active Problems:   MDD (major depressive disorder), recurrent severe, without psychosis (HCC)   Total Time spent with patient: 30 minutes  Musculoskeletal: Strength & Muscle Tone: within normal limits Gait & Station: normal Patient leans: N/A  Psychiatric Specialty Exam  Presentation  General Appearance:  Appropriate for Environment  Eye Contact: Fair  Speech: Clear and Coherent  Speech Volume: Normal  Handedness: Right   Mood and Affect  Mood: Euthymic  Duration of Depression Symptoms: Greater than two weeks  Affect: Appropriate   Thought Process  Thought Processes: Coherent  Descriptions of Associations:Intact  Orientation:Full (Time, Place and Person)  Thought Content:Logical  History of Schizophrenia/Schizoaffective disorder:Yes  Duration of Psychotic Symptoms:No data recorded Hallucinations:Hallucinations: None  Ideas of Reference:None  Suicidal Thoughts:Suicidal Thoughts: No  Homicidal Thoughts:Homicidal Thoughts: No   Sensorium  Memory: Immediate Fair  Judgment: Fair  Insight: Fair   Art Therapist  Concentration: Fair  Attention Span: Fair  Recall: [employer] of Knowledge: Fair  Language: Fair   Psychomotor Activity  Psychomotor Activity: Psychomotor Activity: Normal   Assets  Assets: Communication Skills; Desire for Improvement   Sleep  Sleep: Sleep: Fair  Estimated Sleeping Duration (Last 24 Hours): 3.00-4.00 hours  Physical Exam: Physical Exam ROS Blood pressure 118/81, pulse 85, temperature 97.8 F (36.6 C), temperature source Oral, resp. rate 16, height 5' 8 (1.727 m), weight 61.7 kg, SpO2 100%. Body mass index is 20.68 kg/m.  Mental Status Per Nursing Assessment::   On Admission:  Self-harm thoughts  Demographic Factors:  Male  Loss Factors: Decrease in  vocational status  Historical Factors: Impulsivity  Risk Reduction Factors:   Living with another person, especially a relative, Positive social support, Positive therapeutic relationship, and Positive coping skills or problem solving skills  Continued Clinical Symptoms:  Depression:   Impulsivity  Cognitive Features That Contribute To Risk:  None    Suicide Risk:  Minimal: No identifiable suicidal ideation.  Patients presenting with no risk factors but with morbid ruminations; may be classified as minimal risk based on the severity of the depressive symptoms   Follow-up Information     [facility] Follow up.   Why: Appointment is scheduled for 03/11/24 at 1pm.  Appointment is virtual.  You have referred for ACTT services. Please follow up at discharge. Contact information: [address]  [address] [place] [address] [phone]                 Plan Of Care/Follow-up recommendations:  Activity:  as tolerated  Allyn Foil, MD 03/07/2024, 12:20 PM

## 2024-03-07 NOTE — Progress Notes (Signed)
 [facility] MD Progress Note  03/07/2024 12:23 AM Chris Olson.  MRN:  [id number]  Chris Olson. is a 34 year old, English speaking, Black male. Pt presented to [facility] ED voluntarily. Per triage note: Pt presents via POV c/o body aches, generalized weakness, and cough, reports symptoms have been ongoing for a year. Also reports he is suicidal. Reports I'm tired of feeling like I don't matter. Reports have no plan to hurt himself at current moment but reports has had plans in the past.  On assessment, the patient was visibly depressed. Pt was forthcoming when asked assessment questions. With encouragement, pt. began explaining that he has ongoing relationship issues. Pt identified having multiple life stressors including worsening thoughts of SI, paranoia, and feelings of hopelessness. Pt reported that he often questions what's the point of him being here. Pt reported having chronic depression and issues with sleep disturbance, explaining that he can't shut his mind off.Patient is admitted to adult  psych unit with Q15 min safety monitoring. Multidisciplinary team approach is offered. Medication management; group/milieu therapy is offered.  Subjective:  Chart reviewed, case discussed in multidisciplinary meeting, patient seen during rounds.  03/06/2024: Patient is noted to be outdoors playing basketball.  He reports feeling better.  Patient and provider discussed the discharge planning for tomorrow.  Patient requested for medications to be filled at the local pharmacy so that she has medications handy.  Patient denies SI/HI/plan and denies hallucinations.  Patient reports fair appetite and sleep and denies any side effects to medications. 03/05/2024 patient is noted to be sleeping in his room.  He offers no complaints.  Per social work team patient does not want to go back to his boardinghouse and will be going with his girlfriend.  He denies SI/HI/plan at.  He denies having any conflict with his girlfriend.   He denies auditory/visual hallucinations.  Past Psychiatric History: see h&P Family History: History reviewed. No pertinent family history. Social History:  Social History   Substance and Sexual Activity  Alcohol Use Yes   Alcohol/week: 15.0 standard drinks of alcohol   Types: 15 Cans of beer per week   Comment: (2) 40 oz beers per day most days     Social History   Substance and Sexual Activity  Drug Use Yes   Types: Marijuana, Cocaine    Social History   Socioeconomic History   Marital status: Divorced    Spouse name: Not on file   Number of children: 1   Years of education: Not on file   Highest education level: Not on file  Occupational History   Not on file  Tobacco Use   Smoking status: Every Day    Current packs/day: 1.00    Types: Cigarettes   Smokeless tobacco: Never  Vaping Use   Vaping status: Never Used  Substance and Sexual Activity   Alcohol use: Yes    Alcohol/week: 15.0 standard drinks of alcohol    Types: 15 Cans of beer per week    Comment: (2) 40 oz beers per day most days   Drug use: Yes    Types: Marijuana, Cocaine   Sexual activity: Not Currently  Other Topics Concern   Not on file  Social History Narrative   Not on file   Social Drivers of Health   Tobacco Use: High Risk (03/02/2024)   Patient History    Smoking Tobacco Use: Every Day    Smokeless Tobacco Use: Never    Passive Exposure: Not on file  Financial Resource  Strain: Not on file  Food Insecurity: No Food Insecurity (03/02/2024)   Epic    Worried About Programme Researcher, Broadcasting/film/video in the Last Year: Never true    Ran Out of Food in the Last Year: Never true  Transportation Needs: Unmet Transportation Needs (03/02/2024)   Epic    Lack of Transportation (Medical): Yes    Lack of Transportation (Non-Medical): Yes  Physical Activity: Not on file  Stress: Not on file  Social Connections: Moderately Isolated (03/02/2024)   Social Connection and Isolation Panel    Frequency of Communication  with Friends and Family: Once a week    Frequency of Social Gatherings with Friends and Family: Once a week    Attends Religious Services: More than 4 times per year    Active Member of [employer] or Organizations: No    Attends Banker Meetings: Never    Marital Status: Living with partner  Depression (PHQ2-9): High Risk (09/13/2021)   Depression (PHQ2-9)    PHQ-2 Score: 22  Alcohol Screen: High Risk (03/02/2024)   Alcohol Screen    Last Alcohol Screening Score (AUDIT): 17  Housing: High Risk (03/02/2024)   Epic    Unable to Pay for Housing in the Last Year: No    Number of Times Moved in the Last Year: 1    Homeless in the Last Year: Yes  Utilities: Not At Risk (03/02/2024)   Epic    Threatened with loss of utilities: No  Health Literacy: Not on file   Past Medical History:  Past Medical History:  Diagnosis Date   Alcoholism (HCC)    Anxiety    Chest pain    Cocaine abuse with cocaine-induced mood disorder (HCC) 11/11/2017   Depression    Pericarditis    Schizophrenia (HCC)    History reviewed. No pertinent surgical history.  Current Medications: Current Facility-Administered Medications  Medication Dose Route Frequency Provider Last Rate Last Admin   acetaminophen  (TYLENOL ) tablet 650 mg  650 mg Oral Q6H PRN McLauchlin, Angela, NP       alum & mag hydroxide-simeth (MAALOX/MYLANTA) 200-200-20 MG/5ML suspension 30 mL  30 mL Oral Q4H PRN McLauchlin, Angela, NP       bisacodyl  (DULCOLAX) EC tablet 10 mg  10 mg Oral Daily PRN Dona Klemann, MD       cariprazine  (VRAYLAR ) capsule 3 mg  3 mg Oral Daily McLauchlin, Angela, NP   3 mg at 03/06/24 0831   haloperidol  (HALDOL ) tablet 5 mg  5 mg Oral TID PRN McLauchlin, Angela, NP       And   diphenhydrAMINE  (BENADRYL ) capsule 50 mg  50 mg Oral TID PRN McLauchlin, Jon, NP       haloperidol  lactate (HALDOL ) injection 5 mg  5 mg Intramuscular TID PRN McLauchlin, Jon, NP       And   diphenhydrAMINE  (BENADRYL ) injection 50 mg   50 mg Intramuscular TID PRN McLauchlin, Angela, NP       And   LORazepam  (ATIVAN ) injection 2 mg  2 mg Intramuscular TID PRN McLauchlin, Jon, NP       haloperidol  lactate (HALDOL ) injection 10 mg  10 mg Intramuscular TID PRN McLauchlin, Angela, NP       And   diphenhydrAMINE  (BENADRYL ) injection 50 mg  50 mg Intramuscular TID PRN McLauchlin, Angela, NP       And   LORazepam  (ATIVAN ) injection 2 mg  2 mg Intramuscular TID PRN McLauchlin, Angela, NP  hydrOXYzine  (ATARAX ) tablet 25 mg  25 mg Oral TID PRN McLauchlin, Angela, NP   25 mg at 03/06/24 2146   lamoTRIgine  (LAMICTAL ) tablet 150 mg  150 mg Oral BID McLauchlin, Angela, NP   150 mg at 03/06/24 1719   magnesium  hydroxide (MILK OF MAGNESIA) suspension 30 mL  30 mL Oral Daily PRN McLauchlin, Angela, NP       nicotine  polacrilex (NICORETTE ) gum 2 mg  2 mg Oral PRN Sharlet Notaro, MD   2 mg at 03/06/24 2147   traZODone  (DESYREL ) tablet 50 mg  50 mg Oral QHS PRN McLauchlin, Jon, NP        Lab Results:  No results found for this or any previous visit (from the past 48 hours).   Blood Alcohol level:  Lab Results  Component Value Date   [facility] <15 03/01/2024   ETH <15 08/02/2023    Metabolic Disorder Labs: Lab Results  Component Value Date   HGBA1C 5.1 03/03/2024   MPG 99.67 03/03/2024   MPG 97 08/05/2023   No results found for: PROLACTIN Lab Results  Component Value Date   CHOL 171 03/03/2024   TRIG 171 (H) 03/03/2024   HDL 41 03/03/2024   CHOLHDL 4.2 03/03/2024   VLDL 34 03/03/2024   LDLCALC 96 03/03/2024   LDLCALC 77 08/05/2023    Physical Findings: AIMS:  , ,  ,  ,    CIWA:    COWS:      Psychiatric Specialty Exam:  Presentation  General Appearance:  Appropriate for Environment  Eye Contact: Fair  Speech: Clear and Coherent  Speech Volume: Normal    Mood and Affect  Mood: Fine Affect: Stable  Thought Process  Thought Processes: Coherent  Orientation:Full (Time, Place and  Person)  Thought Content:Logical  Hallucinations: Denies  Ideas of Reference:None  Suicidal Thoughts: Denies  Homicidal Thoughts: Denies   Sensorium  Memory: Immediate Fair  Judgment: Impaired  Insight: Lacking   Art Therapist  Concentration: Fair  Attention Span: Fair  Recall: [employer] of Knowledge: Poor  Language: Fair   Psychomotor Activity  Psychomotor Activity: No data recorded  Musculoskeletal: Strength & Muscle Tone: within normal limits Gait & Station: normal Assets  Assets: Manufacturing Systems Engineer; Desire for Improvement; Resilience    Physical Exam: Physical Exam ROS Blood pressure 118/85, pulse 86, temperature 98.6 F (37 C), temperature source Oral, resp. rate 18, height 5' 8 (1.727 m), weight 61.7 kg, SpO2 100%. Body mass index is 20.68 kg/m.  Diagnosis: Active Problems:   MDD (major depressive disorder), recurrent severe, without psychosis (HCC)  Treatment Plan Summary:  Safety and Monitoring:             -- Voluntary admission to inpatient psychiatric unit for safety, stabilization and treatment             -- Daily contact with patient to assess and evaluate symptoms and progress in treatment             -- Patient's case to be discussed in multi-disciplinary team meeting             -- Observation Level: q15 minute checks             -- Vital signs:  q12 hours             -- Precautions: suicide, elopement, and assault   2. Psychiatric Diagnoses and Treatment:                Vraylar   3 mg and Lamictal  150 mg BID Patient is refusing any medication adjustments at this time   -- The risks/benefits/side-effects/alternatives to this medication were discussed in detail with the patient and time was given for questions. The patient consents to medication trial.                -- Metabolic profile and EKG monitoring obtained while on an atypical antipsychotic (BMI: Lipid Panel: HbgA1c: QTc:)              -- Encouraged  patient to participate in unit milieu and in scheduled group therapies                            3. Medical Issues Being Addressed:     No urgent medical needs identified at this time. 4. Discharge Planning:              -- Social work and case management to assist with discharge planning and identification of hospital follow-up needs prior to discharge             -- Estimated LOS: 5-7 days             -- Discharge Concerns: Need to establish a safety plan; Medication compliance and effectiveness             -- Discharge Goals: Return home with outpatient referrals follow ups  Allyn Foil, MD 03/07/2024, 12:23 AM

## 2024-03-07 NOTE — Plan of Care (Signed)

## 2024-03-07 NOTE — Progress Notes (Signed)
" °   03/07/24 0815  Psych Admission Type (Psych Patients Only)  Admission Status Voluntary  Psychosocial Assessment  Patient Complaints None  Eye Contact Other (Comment) (WDL)  Facial Expression Other (Comment) (WDL)  Affect Appropriate to circumstance  Speech Logical/coherent  Interaction Assertive  Motor Activity Other (Comment) (WDL)  Appearance/Hygiene Unremarkable  Behavior Characteristics Cooperative;Appropriate to situation;Calm  Mood Pleasant  Thought Process  Coherency WDL  Content WDL  Delusions None reported or observed  Perception WDL  Hallucination None reported or observed  Judgment WDL  Confusion None  Danger to Self  Current suicidal ideation? Denies  Agreement Not to Harm Self Yes  Description of Agreement verbal  Danger to Others  Danger to Others None reported or observed   D- Patient alert and oriented x 4. Pt presents with a pleasant mood and affect. Pt laughing with clinical research associate. Pt slept 5 hrs per report, normal per pt. Pt goal is to be discharged today.  Denies SI, HI, AVH, and pain.   A- Scheduled medications administered to patient, per MD orders. Support and encouragement provided.  Routine safety checks conducted every 15 minutes.  Patient informed to notify staff with problems or concerns.  R- No adverse drug reactions noted. Patient contracts for safety at this time. Patient compliant with medications and treatment plan. Patient receptive, calm, and cooperative. Patient interacts well with others on the unit.  Patient remains safe at this time.    Adelis Docter S.,RN "

## 2024-03-07 NOTE — Progress Notes (Signed)
 Discharge Note:  Patient denies SI/HI/AVH at this time. Discharge instructions, AVS, prescriptions, and transition recor gone over with patient. Patient agrees to comply with medication management, follow-up visit, and outpatient therapy. Patient belongings returned to patient. Patient questions and concerns addressed and answered. Patient ambulatory off unit with MHT tech. Patient discharged to home with spouse.  Sena Hoopingarner S.,RN.

## 2024-03-20 ENCOUNTER — Other Ambulatory Visit: Payer: Self-pay

## 2024-03-28 ENCOUNTER — Emergency Department: Payer: MEDICAID

## 2024-03-28 ENCOUNTER — Other Ambulatory Visit: Payer: Self-pay

## 2024-03-28 ENCOUNTER — Emergency Department
Admission: EM | Admit: 2024-03-28 | Discharge: 2024-03-28 | Disposition: A | Discharge status: Other Acute Inpatient Hospital | Payer: MEDICAID | Attending: Emergency Medicine | Admitting: Emergency Medicine

## 2024-03-28 ENCOUNTER — Encounter: Payer: Self-pay | Admitting: Child & Adolescent Psychiatry

## 2024-03-28 ENCOUNTER — Inpatient Hospital Stay
Admit: 2024-03-28 | Discharge: 2024-03-31 | Disposition: A | Discharge status: Still In Hospital | Payer: MEDICAID | Source: Ambulatory Visit | Attending: Psychiatry | Admitting: Psychiatry

## 2024-03-28 DIAGNOSIS — F1220 Cannabis dependence, uncomplicated: Secondary | ICD-10-CM | POA: Diagnosis present

## 2024-03-28 DIAGNOSIS — S82001A Unspecified fracture of right patella, initial encounter for closed fracture: Secondary | ICD-10-CM

## 2024-03-28 DIAGNOSIS — F1420 Cocaine dependence, uncomplicated: Secondary | ICD-10-CM | POA: Diagnosis present

## 2024-03-28 DIAGNOSIS — F1020 Alcohol dependence, uncomplicated: Secondary | ICD-10-CM | POA: Diagnosis present

## 2024-03-28 DIAGNOSIS — R4589 Other symptoms and signs involving emotional state: Secondary | ICD-10-CM

## 2024-03-28 DIAGNOSIS — S300XXA Contusion of lower back and pelvis, initial encounter: Secondary | ICD-10-CM | POA: Insufficient documentation

## 2024-03-28 DIAGNOSIS — R45851 Suicidal ideations: Secondary | ICD-10-CM | POA: Insufficient documentation

## 2024-03-28 DIAGNOSIS — S82041A Displaced comminuted fracture of right patella, initial encounter for closed fracture: Secondary | ICD-10-CM | POA: Insufficient documentation

## 2024-03-28 DIAGNOSIS — F329 Major depressive disorder, single episode, unspecified: Principal | ICD-10-CM | POA: Diagnosis present

## 2024-03-28 DIAGNOSIS — F332 Major depressive disorder, recurrent severe without psychotic features: Secondary | ICD-10-CM | POA: Diagnosis present

## 2024-03-28 DIAGNOSIS — X810XXA Intentional self-harm by jumping or lying in front of motor vehicle, initial encounter: Secondary | ICD-10-CM | POA: Insufficient documentation

## 2024-03-28 DIAGNOSIS — F1910 Other psychoactive substance abuse, uncomplicated: Secondary | ICD-10-CM | POA: Diagnosis present

## 2024-03-28 LAB — URINE DRUG SCREEN
Amphetamines: NEGATIVE
Barbiturates: NEGATIVE
Benzodiazepines: NEGATIVE
Cocaine: POSITIVE — AB
Fentanyl: NEGATIVE
Methadone Scn, Ur: NEGATIVE
Opiates: NEGATIVE
Tetrahydrocannabinol: POSITIVE — AB

## 2024-03-28 LAB — COMPREHENSIVE METABOLIC PANEL WITH GFR
ALT: 42 U/L (ref 0–44)
AST: 45 U/L — ABNORMAL HIGH (ref 15–41)
Albumin: 4.1 g/dL (ref 3.5–5.0)
Alkaline Phosphatase: 55 U/L (ref 38–126)
Anion gap: 16 — ABNORMAL HIGH (ref 5–15)
BUN: 6 mg/dL (ref 6–20)
CO2: 20 mmol/L — ABNORMAL LOW (ref 22–32)
Calcium: 8.5 mg/dL — ABNORMAL LOW (ref 8.9–10.3)
Chloride: 106 mmol/L (ref 98–111)
Creatinine, Ser: 0.77 mg/dL (ref 0.61–1.24)
GFR, Estimated: >60 mL/min
Glucose, Bld: 92 mg/dL (ref 70–99)
Potassium: 3.7 mmol/L (ref 3.5–5.1)
Sodium: 142 mmol/L (ref 135–145)
Total Bilirubin: 0.3 mg/dL (ref 0.0–1.2)
Total Protein: 5.7 g/dL — ABNORMAL LOW (ref 6.5–8.1)

## 2024-03-28 LAB — CBC
HCT: 43.6 % (ref 39.0–52.0)
Hemoglobin: 15.4 g/dL (ref 13.0–17.0)
MCH: 33.6 pg (ref 26.0–34.0)
MCHC: 35.3 g/dL (ref 30.0–36.0)
MCV: 95.2 fL (ref 80.0–100.0)
Platelets: 143 10*3/uL — ABNORMAL LOW (ref 150–400)
RBC: 4.58 MIL/uL (ref 4.22–5.81)
RDW: 12.1 % (ref 11.5–15.5)
WBC: 15 10*3/uL — ABNORMAL HIGH (ref 4.0–10.5)
nRBC: 0 % (ref 0.0–0.2)

## 2024-03-28 LAB — ETHANOL: Alcohol, Ethyl (B): 116 mg/dL — ABNORMAL HIGH

## 2024-03-28 MED ORDER — OXYCODONE HCL 5 MG PO TABS
5.0000 mg | ORAL_TABLET | Freq: Four times a day (QID) | ORAL | Status: DC | PRN
  Administered 2024-03-28: 5 mg via ORAL
  Filled 2024-03-28: qty 1

## 2024-03-28 MED ORDER — NICOTINE POLACRILEX 2 MG MT GUM
2.0000 mg | CHEWING_GUM | OROMUCOSAL | Status: DC | PRN
  Administered 2024-03-28: 2 mg via ORAL

## 2024-03-28 MED ORDER — DIPHENHYDRAMINE HCL 50 MG/ML IJ SOLN: 50.0000 mg | Freq: Three times a day (TID) | INTRAMUSCULAR | Status: DC | PRN

## 2024-03-28 MED ORDER — HALOPERIDOL LACTATE 5 MG/ML IJ SOLN: 5.0000 mg | Freq: Three times a day (TID) | INTRAMUSCULAR | Status: DC | PRN

## 2024-03-28 MED ORDER — CARIPRAZINE HCL 1.5 MG PO CAPS
3.0000 mg | ORAL_CAPSULE | Freq: Every day | ORAL | Status: DC
  Administered 2024-03-28 – 2024-03-31 (×4): 3 mg via ORAL
  Filled 2024-03-28 (×4): qty 2

## 2024-03-28 MED ORDER — PANTOPRAZOLE SODIUM 40 MG PO TBEC
40.0000 mg | DELAYED_RELEASE_TABLET | Freq: Every day | ORAL | Status: DC
  Administered 2024-03-28 – 2024-03-31 (×4): 40 mg via ORAL
  Filled 2024-03-28 (×4): qty 1

## 2024-03-28 MED ORDER — OXYCODONE HCL 5 MG PO TABS
5.0000 mg | ORAL_TABLET | Freq: Four times a day (QID) | ORAL | Status: DC | PRN
  Administered 2024-03-28 – 2024-03-31 (×9): 5 mg via ORAL
  Filled 2024-03-28 (×9): qty 1

## 2024-03-28 MED ORDER — HYDROMORPHONE HCL 1 MG/ML IJ SOLN
1.0000 mg | Freq: Once | INTRAMUSCULAR | Status: AC
  Administered 2024-03-28: 1 mg via INTRAMUSCULAR
  Filled 2024-03-28: qty 1

## 2024-03-28 MED ORDER — ACETAMINOPHEN 325 MG PO TABS: 650.0000 mg | ORAL_TABLET | Freq: Four times a day (QID) | ORAL | Status: DC | PRN

## 2024-03-28 MED ORDER — LAMOTRIGINE 25 MG PO TABS
150.0000 mg | ORAL_TABLET | Freq: Two times a day (BID) | ORAL | Status: DC
  Administered 2024-03-28 – 2024-03-31 (×7): 150 mg via ORAL
  Filled 2024-03-28 (×7): qty 2

## 2024-03-28 MED ORDER — ALUM & MAG HYDROXIDE-SIMETH 200-200-20 MG/5ML PO SUSP: 30.0000 mL | ORAL | Status: DC | PRN

## 2024-03-28 MED ORDER — ACETAMINOPHEN 325 MG PO TABS
650.0000 mg | ORAL_TABLET | Freq: Four times a day (QID) | ORAL | Status: DC | PRN
  Administered 2024-03-28 – 2024-03-31 (×5): 650 mg via ORAL
  Filled 2024-03-28 (×5): qty 2

## 2024-03-28 MED ORDER — TRAZODONE HCL 50 MG PO TABS
50.0000 mg | ORAL_TABLET | Freq: Every evening | ORAL | Status: DC | PRN
  Filled 2024-03-28: qty 1

## 2024-03-28 MED ORDER — HYDROMORPHONE HCL 1 MG/ML IJ SOLN
0.5000 mg | Freq: Once | INTRAMUSCULAR | Status: AC
  Administered 2024-03-28: 0.5 mg via INTRAMUSCULAR

## 2024-03-28 MED ORDER — DIPHENHYDRAMINE HCL 25 MG PO CAPS: 50.0000 mg | ORAL_CAPSULE | Freq: Three times a day (TID) | ORAL | Status: DC | PRN

## 2024-03-28 MED ORDER — MAGNESIUM HYDROXIDE 400 MG/5ML PO SUSP
30.0000 mL | Freq: Every day | ORAL | Status: DC | PRN
  Filled 2024-03-28: qty 30

## 2024-03-28 MED ORDER — LORAZEPAM 2 MG/ML IJ SOLN: 2.0000 mg | Freq: Three times a day (TID) | INTRAMUSCULAR | Status: DC | PRN

## 2024-03-28 MED ORDER — HYDROMORPHONE HCL 1 MG/ML IJ SOLN
0.5000 mg | Freq: Once | INTRAMUSCULAR | Status: DC
  Filled 2024-03-28: qty 0.5

## 2024-03-28 MED ORDER — HYDROXYZINE HCL 25 MG PO TABS: 25.0000 mg | ORAL_TABLET | Freq: Three times a day (TID) | ORAL | Status: DC | PRN

## 2024-03-28 MED ORDER — HALOPERIDOL 5 MG PO TABS: 5.0000 mg | ORAL_TABLET | Freq: Three times a day (TID) | ORAL | Status: DC | PRN

## 2024-03-28 NOTE — ED Notes (Signed)
 Pt given meal tray at this time. Beverage provided.

## 2024-03-28 NOTE — ED Notes (Signed)
 Patient requested assistants to helping use the bathroom, patient was unable to stand due to pain on his right knee. This clinical research associate provided the patient with a bedside commode. Patient was able to tolerate and self transfer from bed to commode, with the assist of this Economist.

## 2024-03-28 NOTE — BH Assessment (Signed)
 Patient is to be admitted to [facility] by NP Tracie H .  Attending Physician will be Dr. Ruther.   Patient has been assigned to room 302, by [facility] Charge Nurse Demetria R.   Intake Paper Work has been signed and placed on patient chart.  ER staff is aware of the admission: Ronnie Secretary   Dr. Ernest, ER MD  Harlene RAMAN Patient's Nurse  Danaiyzha Patient Access.
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
## 2024-03-28 NOTE — Progress Notes (Signed)
" °   03/28/24 1730  Assessment Details  Time of Assessment Admission  Information Obtained From Patient  To be done on Admission  Risk Factors related to Demographics Male;Low socioeconomic status;Unemployed  Current Mental Status NA  Loss Factors Financial problems / change in socioeconomic status  Historical Factors NA  Risk Reduction Factors NA  [place] Suicide Severity Rating Scale  1. In the past month -  Have you wished you were dead or wished you could go to sleep and not wake up? Yes (from previous admission this month; patient states that this was not a suicide attempt)  2. In the past month - Have you actually had any thoughts of killing yourself? No  3. In the past month - Have you been thinking about how you might kill yourself? No  4. In the past month - Have you had these thoughts and had some intention of acting on them? No  5. In the past month - Have you started to work out or worked out the details of how to kill yourself? Do you intend to carry out this plan? No  6. Have you ever done anything, started to do anything, or prepared to do anything to end your life? No  7. Was this within the past three months? No  C-SSRS RISK CATEGORY Error: Q3, 4, or 5 should not be populated when Q2 is No  Patient location: Behavioral Health In-patient Unit  [facility] Suicide Precaution Interventions  [facility] Suicide Bundle Interventions Low Risk Interventions implemented    "

## 2024-03-28 NOTE — ED Notes (Signed)
 Hospital meal provided, pt tolerated w/o complaints.  Waste discarded appropriately.
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
## 2024-03-28 NOTE — Group Note (Unsigned)
 Date:  03/29/2024 Time:  2:00 AM    Additional Comments:  Did not attend group.  Chris Olson 03/29/2024, 2:00 AM

## 2024-03-28 NOTE — Plan of Care (Signed)
 New admission.  Problem: Education: Goal: Knowledge of [facility] Education information/materials will improve Outcome: Not Progressing Goal: Emotional status will improve Outcome: Not Progressing Goal: Mental status will improve Outcome: Not Progressing Goal: Verbalization of understanding the information provided will improve Outcome: Not Progressing   Problem: Activity: Goal: Interest or engagement in activities will improve Outcome: Not Progressing Goal: Sleeping patterns will improve Outcome: Not Progressing   Problem: Coping: Goal: Ability to verbalize frustrations and anger appropriately will improve Outcome: Not Progressing Goal: Ability to demonstrate self-control will improve Outcome: Not Progressing   Problem: Health Behavior/Discharge Planning: Goal: Identification of resources available to assist in meeting health care needs will improve Outcome: Not Progressing Goal: Compliance with treatment plan for underlying cause of condition will improve Outcome: Not Progressing   Problem: Physical Regulation: Goal: Ability to maintain clinical measurements within normal limits will improve Outcome: Not Progressing   Problem: Safety: Goal: Periods of time without injury will increase Outcome: Not Progressing   Problem: Self-Concept: Goal: Ability to identify factors that promote anxiety will improve Outcome: Not Progressing Goal: Level of anxiety will decrease Outcome: Not Progressing Goal: Ability to modify response to factors that promote anxiety will improve Outcome: Not Progressing   Problem: Education: Goal: Knowledge of [facility] Education information/materials will improve Outcome: Not Progressing Goal: Emotional status will improve Outcome: Not Progressing Goal: Mental status will improve Outcome: Not Progressing Goal: Verbalization of understanding the information provided will improve Outcome: Not Progressing   Problem:  Coping: Goal: Coping ability will improve Outcome: Not Progressing Goal: Will verbalize feelings Outcome: Not Progressing
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
## 2024-03-28 NOTE — Consult Note (Cosign Needed)
 [facility] Psychiatric Consult Initial  Patient Name: .Chris Olson.  MRN: [id number]  DOB: [birth year]  Consult Order details:  Orders (From admission, onward)     Start     Ordered   03/28/24 0124  CONSULT TO CALL ACT TEAM       Ordering Provider: Jacolyn Pae, MD  Provider:  (Not yet assigned)  Question:  Reason for Consult?  Answer:  Psych consult   03/28/24 0124   03/28/24 0124  IP CONSULT TO PSYCHIATRY       Ordering Provider: Jacolyn Pae, MD  Provider:  (Not yet assigned)  Question Answer Comment  Reason for consult: Other (see comments)   Comments: Psychosis      03/28/24 0124             Mode of Visit: In person    Psychiatry Consult Evaluation  Service Date: March 28, 2024 LOS:  LOS: 0 days  Chief Complaint knee injury  Primary Psychiatric Diagnoses  MDD Polysusbtance abuse   Assessment  CLINICAL DECISION MAKING:  Janice Seales. is a 34 y.o. male admitted: Presented to the [facility] 03/28/2024 12:57 AM for knee injury. He carries the psychiatric diagnoses of MDD and polysubstance abuse and has a past medical history of  GERD. Patient was transported to the ED via EMS after an episode of self harm after jumping from a moving vehicle during an argument with his GF. Patient states that this was not a suicide attempted however according to chart review he was recently admitted to [facility] for depression and suicidal thoughts. Patient is assessed today in the ER and he is lying in bed in no acute distress. He is alert and oriented x4 and has a depressed affect. He explains that he has been in a relationship with his GF for 2 years and he is tired of the constant arguing and him trying to prove himself. Further states that in the moment, he jumped out of the care to escape the arguing I just didn't want to be around her, I just don't want to argue anymore. Patient reports that he recently began renting a room at a boarding house and doesn't want to  stay there. He has been living with is GF and her family and he is unsure at this time if she wants him to return.   He was discharged from [facility] on 03/07/2024 after a 5 day admission for depression and suicidal thoughts related to his poor social supports and ongoing depression. He has no social supports and no other living arrangements and given his recent admission, depressive symptoms, suicidal thoughts, and impulsive behaviors he is at high risk for suicide at this time.     Diagnoses:  Active Hospital problems: Active Problems:   Severe recurrent major depression without psychotic features (HCC)   Polysubstance abuse (HCC)   Cocaine use disorder, moderate, in controlled environment (HCC)   Moderate cannabis use disorder (HCC)   Moderate alcohol use disorder (HCC)   MDD (major depressive disorder), recurrent severe, without psychosis (HCC)    Plan   ## Disposition: Psychiatric admission  ## Psychiatric Medication Recommendations:  Continue discharge medications  ## Medical Decision Making Capacity: Not specifically addressed in this encounter  ## Further Work-up:  EKG- QTC: Labs:  ## Behavioral / Environmental: -Utilize compassion and acknowledge the patient's experiences while setting clear and realistic expectations for care.    ## Safety and Observation Level:  - Based on my clinical evaluation, I estimate the patient  to be at HIGH risk of self harm in the current setting. - At this time, we recommend  routine. This decision is based on my review of the chart including patient's history and current presentation, interview of the patient, mental status examination, and consideration of suicide risk including evaluating suicidal ideation, plan, intent, suicidal or self-harm behaviors, risk factors, and protective factors. This judgment is based on our ability to directly address suicide risk, implement suicide prevention strategies, and develop a safety plan while the patient  is in the clinical setting. Please contact our team if there is a concern that risk level has changed.  CSSR Risk Category:C-SSRS RISK CATEGORY:  Flowsheet Row ED from 03/28/2024 in [facility] Emergency Department at [facility] Admission (Discharged) from 03/02/2024 in [facility] INPATIENT BEHAVIORAL MEDICINE ED from 03/01/2024 in [facility] Emergency Department at [facility]  C-SSRS RISK CATEGORY High Risk No Risk No Risk     Suicide Risk Assessment: Patient has following modifiable risk factors for suicide: active suicidal ideation, under treated depression , recklessness, medication noncompliance, current symptoms: anxiety/panic, insomnia, impulsivity, anhedonia, hopelessness, triggering events, and recent psychiatric hospitalization, which we are addressing by medication management stabilization, ensuring safe environment, and therapeutic communication.  Patient has following non-modifiable or demographic risk factors for suicide: male gender, history of suicide attempt, and psychiatric hospitalization  Patient has the following protective factors against suicide: Access to outpatient mental health care  Thank you for this consult request. Recommendations have been communicated to the primary team.  We will recommend inpatient admission at this time.       History of Present Illness  Relevant Aspects of Hospital ED   Patient Report:   Chris Olson. is a 34 y.o. male admitted: Presented to the [facility] 03/28/2024 12:57 AM for knee injury. He carries the psychiatric diagnoses of MDD and polysubstance abuse and has a past medical history of  GERD. Patient was transported to the ED via EMS after an episode of self harm after jumping from a moving vehicle during an argument with his GF. Patient states that this was not a suicide attempted however according to chart review he was recently admitted to [facility] for depression and suicidal thoughts. Patient is assessed today in the ER and he is  lying in bed in no acute distress. He is alert and oriented x4 and has a depressed affect. He explains that he has been in a relationship with his GF for 2 years and he is tired of the constant arguing and him trying to prove himself. Further states that in the moment, he jumped out of the care to escape the arguing I just didn't want to be around her, I just don't want to argue anymore. Patient reports that he recently began renting a room at a boarding house and doesn't want to stay there. He has been living with is GF and her family and he is unsure at this time if she wants him to return.   He was discharged from [facility] on 03/07/2024 after a 5 day admission for depression and suicidal thoughts related to his poor social supports and ongoing depression. He has no social supports and no other living arrangements and given his recent admission, depressive symptoms, suicidal thoughts, and impulsive behaviors he is at high risk for suicide at this time.   Psychiatric and Social History  Psychiatric History:  Information collected from patient/chart  Prev Dx/Sx: MDD, polysubstance abuse Current Psych Provider: unknown Home Meds (current): non compliant Previous  Med Trials: see chart Therapy:  Prior Psych Hospitalization: [facility] 03/02/2024-03/07/2024 Prior Suicide attempt/Self Harm: endorses Prior Violence:   Family Psych History: unknown Family Hx suicide: unknown  Social History:  Educational Hx: unknown Occupational Hx: unknown Legal Hx: unknown Living Situation: with GF Spiritual Hx:  Access to weapons/lethal means: denies   Substance History Alcohol: yes Last Drink : alcohol beer and wine Number of drinks per day : varies History of alcohol withdrawal seizures: denies History of DT's: denies Tobacco: denies Illicit drugs: cocaine, THC Prescription drug abuse: denies Rehab hx: denies  Exam Findings  Physical Exam: Reviewed and agree with the physical exam findings conducted by the  medical provider Vital Signs:  Temp:  [98.3 F (36.8 C)-98.7 F (37.1 C)] 98.3 F (36.8 C) (01/31 0955) Pulse Rate:  [86-106] 86 (01/31 0955) Resp:  [18-19] 18 (01/31 0955) BP: (113-119)/(81-84) 119/84 (01/31 0955) SpO2:  [98 %] 98 % (01/31 0955) Weight:  [61.7 kg] 61.7 kg (01/31 0117) Blood pressure 119/84, pulse 86, temperature 98.3 F (36.8 C), temperature source Oral, resp. rate 18, height 5' 8 (1.727 m), weight 61.7 kg, SpO2 98%. Body mass index is 20.68 kg/m.    Mental Status Exam: General Appearance: Fairly Groomed  Orientation:  Full (Time, Place, and Person)  Memory:  Immediate;   Good Recent;   Good Remote;   Good  Concentration:  Concentration: Fair and Attention Span: Fair  Recall:  Good  Attention  Fair  Eye Contact:  Minimal  Speech:  Normal Rate  Language:  Good  Volume:  Normal  Mood: depressed  Affect:  Depressed  Thought Process:  Coherent  Thought Content:  WDL  Suicidal Thoughts:  No  Homicidal Thoughts:  No  Judgement:  Impaired  Insight:  Lacking  Psychomotor Activity:  Normal  Akathisia:  No  Fund of Knowledge:  Good      Assets:  Desire for Improvement Physical Health  Cognition:  WNL  ADL's:  Impaired  AIMS (if indicated):        Other History   These have been pulled in through the EMR, reviewed, and updated if appropriate.  Family History:  The patient's family history is not on file.  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 No pertinent surgical history.   Medications:  Current Medications[1]  Allergies: Allergies[2]  Chanler Mendonca B Harjit Leider, NP     [1]  Current Facility-Administered Medications:    acetaminophen  (TYLENOL ) tablet 650 mg, 650 mg, Oral, Q6H PRN, Ernest Ronal BRAVO, MD   oxyCODONE  (Oxy  IR/ROXICODONE ) immediate release tablet 5 mg, 5 mg, Oral, Q6H PRN, Ernest Ronal BRAVO, MD, 5 mg at 03/28/24 1144  Current Outpatient Medications:    cariprazine  (VRAYLAR ) 3 MG capsule, Take 1 capsule (3 mg total) by mouth daily., Disp: 30 capsule, Rfl: 0   hydrOXYzine  (ATARAX ) 25 MG tablet, Take 1 tablet (25 mg total) by mouth 3 (three) times daily as needed for anxiety., Disp: 30 tablet, Rfl: 0   lamoTRIgine  (LAMICTAL ) 150 MG tablet, Take 1 tablet (150 mg total) by mouth 2 (two) times daily., Disp: 60 tablet, Rfl: 0   nicotine  polacrilex (NICORETTE ) 2 MG gum, Take 1 each (2 mg total) by mouth as needed for smoking cessation., Disp:  100 tablet, Rfl: 0   omeprazole  (PRILOSEC) 20 MG capsule, Take 1 capsule (20 mg total) by mouth daily., Disp: 30 capsule, Rfl: 0   traZODone  (DESYREL ) 50 MG tablet, Take 1 tablet (50 mg total) by mouth at bedtime as needed for sleep., Disp: 30 tablet, Rfl: 0 [2] No Known Allergies

## 2024-03-28 NOTE — Group Note (Signed)
 Date:  03/28/2024 Time:  10:28 PM  Group Topic/Focus:  Overcoming Stress:   The focus of this group is to define stress and help patients assess their triggers.    Participation Level:  Did Not Attend   Chris Olson 03/28/2024, 10:28 PM

## 2024-03-28 NOTE — ED Notes (Addendum)
 Pt tearful at bedside. Stating nobody cares about me. Pt informed on POC. Pt worried about his leg that he hurt. RN offered reassurance at this time. Pt denies any physical needs at this time. Pain medicine given.

## 2024-03-28 NOTE — Progress Notes (Signed)
" °   03/28/24 2200  Psych Admission Type (Psych Patients Only)  Admission Status Involuntary  Psychosocial Assessment  Patient Complaints None  Eye Contact Fair  Facial Expression Sad  Affect Anxious  Speech Logical/coherent  Interaction Assertive  Motor Activity Slow  Appearance/Hygiene Disheveled  Behavior Characteristics Appropriate to situation;Cooperative  Mood Sad;Anxious;Depressed  Aggressive Behavior  Effect No apparent injury  Thought Process  Coherency WDL  Content Blaming self  Delusions None reported or observed  Perception WDL  Hallucination None reported or observed  Judgment Limited  Confusion None  Danger to Self  Current suicidal ideation? Denies  Agreement Not to Harm Self Yes  Description of Agreement Verbal  Danger to Others  Danger to Others None reported or observed   Pt in bed expressed frustration that he can't get it right; adding, I am disappointed in myself.  He denied SI/AVH.   "

## 2024-03-28 NOTE — ED Notes (Signed)
 Patient has been given to patient.

## 2024-03-28 NOTE — Tx Team (Signed)
 Initial Treatment Plan 03/28/2024 6:47 PM Chris Olson. FMW:[id number]    PATIENT STRESSORS: Financial difficulties   Marital or family conflict   Substance abuse     PATIENT STRENGTHS: Forensic Psychologist fund of knowledge  Motivation for treatment/growth    PATIENT IDENTIFIED PROBLEMS: Jumped out of a moving car  Argument with girlfriend  Anxiety  Depression  Substance abuse  Unemployed           DISCHARGE CRITERIA:  Ability to meet basic life and health needs Improved stabilization in mood, thinking, and/or behavior Need for constant or close observation no longer present Reduction of life-threatening or endangering symptoms to within safe limits  PRELIMINARY DISCHARGE PLAN: Outpatient therapy Return to previous living arrangement Referrals indicated:  patient made a statement about potentially wanting to go to rehab  PATIENT/FAMILY INVOLVEMENT: This treatment plan has been presented to and reviewed with the patient, Chris Tash Jr.. The patient has been given the opportunity to ask questions and make suggestions.  Kana Reimann, RN 03/28/2024, 6:47 PM

## 2024-03-29 MED ORDER — NICOTINE POLACRILEX 2 MG MT GUM
4.0000 mg | CHEWING_GUM | OROMUCOSAL | Status: DC | PRN
  Administered 2024-03-29 – 2024-03-30 (×2): 4 mg via ORAL
  Filled 2024-03-29 (×2): qty 2

## 2024-03-29 MED ORDER — MIRTAZAPINE 15 MG PO TABS
7.5000 mg | ORAL_TABLET | Freq: Every day | ORAL | Status: DC
  Administered 2024-03-29 – 2024-03-31 (×3): 7.5 mg via ORAL
  Filled 2024-03-29 (×3): qty 1

## 2024-03-29 NOTE — Plan of Care (Signed)

## 2024-03-29 NOTE — Group Note (Signed)
 Date:  03/29/2024 Time:  7:10 PM  Group Topic/Focus:  Coping With Mental Health Crisis:   The purpose of this group is to help patients identify strategies for coping with mental health crisis.  Group discusses possible causes of crisis and ways to manage them effectively. Healthy Communication:   The focus of this group is to discuss communication, barriers to communication, as well as healthy ways to communicate with others.  A group session was conducted with patients utilizing the activity Two Truths and One Lie to promote peer interaction and allow both patients and facilitator to become more familiar with one another. Following this activity, patients participated in an arts and crafts intervention involving the creation of viral nursing glove dolls. Patients demonstrated enjoyment and engagement throughout the activity. This intervention provided an opportunity for patients to use a simple and accessible coping mechanism to redirect focus and promote mindfulness. Materials used were minimal, including one glove, a mask, and a marker. Patients appeared excited during the process and expressed a sense of accomplishment upon completing the activity.  Participation Level:  Did Not Attend  Participation Quality:    Affect:    Cognitive:    Insight:   Engagement in Group:    Modes of Intervention:    Additional Comments:    Astria Jordahl L Anali Cabanilla 03/29/2024, 7:10 PM

## 2024-03-29 NOTE — Progress Notes (Signed)
" °   03/29/24 9187  Psych Admission Type (Psych Patients Only)  Admission Status Involuntary  Psychosocial Assessment  Patient Complaints Depression;Crying spells  Eye Contact Fair  Facial Expression Sad  Affect Depressed;Sad  Speech Logical/coherent  Interaction Assertive  Motor Activity Slow  Appearance/Hygiene In scrubs;Disheveled  Behavior Characteristics Cooperative  Mood Sad;Depressed;Pleasant  Aggressive Behavior  Effect No apparent injury  Thought Process  Coherency WDL  Delusions None reported or observed  Perception WDL  Hallucination None reported or observed  Judgment Impaired  Confusion WDL  Danger to Self  Current suicidal ideation? Denies  Self-Injurious Behavior No self-injurious ideation or behavior indicators observed or expressed   Agreement Not to Harm Self Yes  Description of Agreement verbal  Danger to Others  Danger to Others None reported or observed    "
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
## 2024-03-29 NOTE — Group Note (Signed)
 Date:  03/29/2024 Time:  8:37 PM    Additional Comments:  Did not attend group.    Butler LITTIE Gelineau 03/29/2024, 8:37 PM

## 2024-03-29 NOTE — Plan of Care (Signed)

## 2024-03-30 NOTE — BH IP Treatment Plan (Signed)
 Interdisciplinary Treatment and Diagnostic Plan Update  03/30/2024 Time of Session: 11:00AM Chris Pelland Jr. MRN: [id number]  Principal Diagnosis: Major depressive disorder  Secondary Diagnoses: Principal Problem:   Major depressive disorder   Current Medications:  Current Facility-Administered Medications  Medication Dose Route Frequency Provider Last Rate Last Admin   acetaminophen  (TYLENOL ) tablet 650 mg  650 mg Oral Q6H PRN Hampton, Tracie B, NP   650 mg at 03/29/24 1149   alum & mag hydroxide-simeth (MAALOX/MYLANTA) 200-200-20 MG/5ML suspension 30 mL  30 mL Oral Q4H PRN Hampton, Tracie B, NP       cariprazine  (VRAYLAR ) capsule 3 mg  3 mg Oral Daily Hampton, Tracie B, NP   3 mg at 03/30/24 9182   haloperidol  (HALDOL ) tablet 5 mg  5 mg Oral TID PRN Hampton, Tracie B, NP       And   diphenhydrAMINE  (BENADRYL ) capsule 50 mg  50 mg Oral TID PRN Hampton, Tracie B, NP       haloperidol  lactate (HALDOL ) injection 5 mg  5 mg Intramuscular TID PRN Hampton, Tracie B, NP       And   diphenhydrAMINE  (BENADRYL ) injection 50 mg  50 mg Intramuscular TID PRN Hampton, Tracie B, NP       And   LORazepam  (ATIVAN ) injection 2 mg  2 mg Intramuscular TID PRN Hampton, Tracie B, NP       hydrOXYzine  (ATARAX ) tablet 25 mg  25 mg Oral TID PRN Hampton, Tracie B, NP       lamoTRIgine  (LAMICTAL ) tablet 150 mg  150 mg Oral BID Hampton, Tracie B, NP   150 mg at 03/30/24 9182   magnesium  hydroxide (MILK OF MAGNESIA) suspension 30 mL  30 mL Oral Daily PRN Hampton, Tracie B, NP       mirtazapine  (REMERON ) tablet 7.5 mg  7.5 mg Oral QHS May, Tanya, NP   7.5 mg at 03/29/24 2128   nicotine  polacrilex (NICORETTE ) gum 4 mg  4 mg Oral PRN May, Tanya, NP   4 mg at 03/29/24 1736   oxyCODONE  (Oxy IR/ROXICODONE ) immediate release tablet 5 mg  5 mg Oral Q6H PRN Hampton, Tracie B, NP   5 mg at 03/30/24 9182   pantoprazole  (PROTONIX ) EC tablet 40 mg  40 mg Oral Daily Hampton, Tracie B, NP   40 mg at 03/30/24 9182   PTA  Medications: Medications Prior to Admission  Medication Sig Dispense Refill Last Dose/Taking   cariprazine  (VRAYLAR ) 3 MG capsule Take 1 capsule (3 mg total) by mouth daily. 30 capsule 0    hydrOXYzine  (ATARAX ) 25 MG tablet Take 1 tablet (25 mg total) by mouth 3 (three) times daily as needed for anxiety. 30 tablet 0    lamoTRIgine  (LAMICTAL ) 150 MG tablet Take 1 tablet (150 mg total) by mouth 2 (two) times daily. 60 tablet 0    nicotine  polacrilex (NICORETTE ) 2 MG gum Take 1 each (2 mg total) by mouth as needed for smoking cessation. 100 tablet 0    omeprazole  (PRILOSEC) 20 MG capsule Take 1 capsule (20 mg total) by mouth daily. 30 capsule 0    traZODone  (DESYREL ) 50 MG tablet Take 1 tablet (50 mg total) by mouth at bedtime as needed for sleep. 30 tablet 0     Patient Stressors: Financial difficulties   Marital or family conflict   Substance abuse    Patient Strengths: Careers Information Officer for treatment/growth   Treatment Modalities: Medication Management, Group therapy,  Case management,  1 to 1 session with clinician, Psychoeducation, Recreational therapy.   Physician Treatment Plan for Primary Diagnosis: Major depressive disorder Long Term Goal(s): Improvement in symptoms so as ready for discharge   Short Term Goals: Ability to identify changes in lifestyle to reduce recurrence of condition will improve Ability to verbalize feelings will improve Ability to demonstrate self-control will improve Ability to identify and develop effective coping behaviors will improve Ability to maintain clinical measurements within normal limits will improve Ability to identify triggers associated with substance abuse/mental health issues will improve  Medication Management: Evaluate patient's response, side effects, and tolerance of medication regimen.  Therapeutic Interventions: 1 to 1 sessions, Unit Group sessions and Medication administration.  Evaluation  of Outcomes: Not Met  Physician Treatment Plan for Secondary Diagnosis: Principal Problem:   Major depressive disorder  Long Term Goal(s): Improvement in symptoms so as ready for discharge   Short Term Goals: Ability to identify changes in lifestyle to reduce recurrence of condition will improve Ability to verbalize feelings will improve Ability to demonstrate self-control will improve Ability to identify and develop effective coping behaviors will improve Ability to maintain clinical measurements within normal limits will improve Ability to identify triggers associated with substance abuse/mental health issues will improve     Medication Management: Evaluate patient's response, side effects, and tolerance of medication regimen.  Therapeutic Interventions: 1 to 1 sessions, Unit Group sessions and Medication administration.  Evaluation of Outcomes: Not Met   RN Treatment Plan for Primary Diagnosis: Major depressive disorder Long Term Goal(s): Knowledge of disease and therapeutic regimen to maintain health will improve  Short Term Goals: Ability to demonstrate self-control, Ability to participate in decision making will improve, Ability to verbalize feelings will improve, Ability to disclose and discuss suicidal ideas, Ability to identify and develop effective coping behaviors will improve, and Compliance with prescribed medications will improve  Medication Management: RN will administer medications as ordered by provider, will assess and evaluate patient's response and provide education to patient for prescribed medication. RN will report any adverse and/or side effects to prescribing provider.  Therapeutic Interventions: 1 on 1 counseling sessions, Psychoeducation, Medication administration, Evaluate responses to treatment, Monitor vital signs and CBGs as ordered, Perform/monitor CIWA, COWS, AIMS and Fall Risk screenings as ordered, Perform wound care treatments as ordered.  Evaluation  of Outcomes: Not Met   LCSW Treatment Plan for Primary Diagnosis: Major depressive disorder Long Term Goal(s): Safe transition to appropriate next level of care at discharge, Engage patient in therapeutic group addressing interpersonal concerns.  Short Term Goals: Engage patient in aftercare planning with referrals and resources, Increase social support, Increase ability to appropriately verbalize feelings, Increase emotional regulation, Facilitate acceptance of mental health diagnosis and concerns, Facilitate patient progression through stages of change regarding substance use diagnoses and concerns, Identify triggers associated with mental health/substance abuse issues, and Increase skills for wellness and recovery  Therapeutic Interventions: Assess for all discharge needs, 1 to 1 time with Social worker, Explore available resources and support systems, Assess for adequacy in community support network, Educate family and significant other(s) on suicide prevention, Complete Psychosocial Assessment, Interpersonal group therapy.  Evaluation of Outcomes: Not Met   Progress in Treatment: Attending groups: No. Participating in groups: No. Taking medication as prescribed: Yes. Toleration medication: Yes. Family/Significant other contact made: No, will contact:  once permission has been granted Patient understands diagnosis: Yes. Discussing patient identified problems/goals with staff: Yes. Medical problems stabilized or resolved: Yes. Denies suicidal/homicidal ideation: Yes. Issues/concerns  per patient self-inventory: No. Other: none  New problem(s) identified: No, Describe:  none  New Short Term/Long Term Goal(s): detox, elimination of symptoms of psychosis, medication management for mood stabilization; elimination of SI thoughts; development of comprehensive mental wellness/sobriety plan.   Patient Goals:  get my leg right  Discharge Plan or Barriers: CSW to assist the patient in  development of appropriate discharge plans.  Reason for Continuation of Hospitalization: Anxiety Depression Medication stabilization Suicidal ideation  Estimated Length of Stay:  1-7 days  Last 3 [place] Suicide Severity Risk Score: Flowsheet Row Admission (Current) from 03/28/2024 in [facility] INPATIENT BEHAVIORAL MEDICINE Most recent reading at 03/28/2024  5:30 PM ED from 03/28/2024 in [facility] Emergency Department at [facility] Most recent reading at 03/28/2024 12:36 PM Admission (Discharged) from 03/02/2024 in [facility] INPATIENT BEHAVIORAL MEDICINE Most recent reading at 03/02/2024  4:23 AM  C-SSRS RISK CATEGORY Error: Q3, 4, or 5 should not be populated when Q2 is No High Risk No Risk    Last PHQ 2/9 Scores:    09/13/2021    3:42 AM 09/10/2019   11:16 AM 09/05/2019    7:43 PM  Depression screen PHQ 2/9  Decreased Interest 3 3 3   Down, Depressed, Hopeless 3 3 3   PHQ - 2 Score 6 6 6   Altered sleeping 2 3 3   Tired, decreased energy 2 3 3   Change in appetite 2 3 3   Feeling bad or failure about yourself  3 3 3   Trouble concentrating 3 3 3   Moving slowly or fidgety/restless 3 2 3   Suicidal thoughts 1 3 3   PHQ-9 Score 22  26  27    Difficult doing work/chores Very difficult Extremely dIfficult      Data saved with a previous flowsheet row definition    Scribe for Treatment Team: Sherryle JINNY Margo, LCSW 03/30/2024 4:05 PM

## 2024-03-30 NOTE — Group Note (Signed)
 Date:  03/30/2024 Time:  8:37 PM  Group Topic/Focus:  Orientation:   The focus of this group is to educate the patient on the purpose and policies of crisis stabilization and provide a format to answer questions about their admission.  The group details unit policies and expectations of patients while admitted. Wrap-Up Group:   The focus of this group is to help patients review their daily goal of treatment and discuss progress on daily workbooks.    Participation Level:  Active  Participation Quality:  Appropriate and Attentive  Affect:  Appropriate  Cognitive:  Alert and Appropriate  Insight: Appropriate and Good  Engagement in Group:  Engaged  Modes of Intervention:  Orientation  Additional Comments:     Arlester CHRISTELLA Servant 03/30/2024, 8:37 PM

## 2024-03-30 NOTE — Group Note (Signed)
 Recreation Therapy Group Note   Group Topic:Self-Esteem  Group Date: 03/30/2024 Start Time: 1100 End Time: 1140 Facilitators: Celestia Jeoffrey BRAVO, LRT, CTRS Location: Craft Room  Group Description: Positive Affirmation Worksheet. Patients and LRT discussed the importance of self-love/self-esteem and things that cause it to fluctuate, including our mental health. Patients completed a worksheet that helps them identify 24 different strengths and qualities about themselves. Pt encouraged to read aloud at least 3 off their sheet to the group. LRT and pts discussed how this can be applied to daily life post-discharge.   Goal Area(s) Addressed: Patient will identify positive qualities about themselves. Patient will learn new positive affirmations.  Patient will recite positive qualities and affirmations aloud to the group.  Patient will practice positive self-talk.  Patient will increase communication.   Affect/Mood: N/A   Participation Level: Did not attend    Clinical Observations/Individualized Feedback: Patient did not attend.  Plan: Continue to engage patient in RT group sessions 2-3x/week.   Jeoffrey BRAVO Celestia, LRT, CTRS 03/30/2024 11:41 AM

## 2024-03-30 NOTE — Group Note (Signed)
 Recreation Therapy Group Note   Group Topic:General Recreation  Group Date: 03/30/2024 Start Time: 1530 End Time: 1620 Facilitators: Celestia Jeoffrey FORBES ARTICE, CTRS Location: Craft Room  Group Description: Bingo. Patients played multiple rounds of bingo. LRT and pts discussed the definition of leisure, things they do in their free time outside of the hospital, and how bingo is also a leisure activity. Pts received a coloring book, word search book, or journal as a prize.    Goal Area(s) Addressed:  Patient will identify a current leisure interest.  Patient will learn the definition of leisure. Patient will have the opportunity to try a new leisure activity. Patient will communicate with peers and LRT.   Affect/Mood: N/A   Participation Level: Did not attend    Clinical Observations/Individualized Feedback: Patient did not attend.  Plan: Continue to engage patient in RT group sessions 2-3x/week.   Jeoffrey FORBES Celestia, LRT, CTRS 03/30/2024 5:06 PM

## 2024-03-30 NOTE — Progress Notes (Signed)
" °   03/30/24 1047  Psych Admission Type (Psych Patients Only)  Admission Status Involuntary  Psychosocial Assessment  Patient Complaints None  Eye Contact Fair  Facial Expression Flat  Affect Appropriate to circumstance  Speech Logical/coherent  Interaction Assertive  Motor Activity Slow  Appearance/Hygiene In scrubs  Behavior Characteristics Cooperative;Appropriate to situation  Mood Pleasant  Aggressive Behavior  Effect No apparent injury  Thought Process  Coherency WDL  Content WDL  Delusions None reported or observed  Perception WDL  Hallucination None reported or observed  Judgment WDL  Confusion WDL  Danger to Self  Current suicidal ideation? Denies  Self-Injurious Behavior No self-injurious ideation or behavior indicators observed or expressed   Agreement Not to Harm Self Yes  Description of Agreement verbal  Danger to Others  Danger to Others None reported or observed    "
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
## 2024-03-30 NOTE — Group Note (Signed)
"                                                 [facility] LCSW Group Therapy Note    Group Date: 03/30/2024 Start Time: 1300 End Time: 1345  Type of Therapy and Topic:  Group Therapy:  Overcoming Obstacles  Participation Level:  [facility] PARTICIPATION LEVEL: Did Not Attend  Description of Group:   In this group patients will be encouraged to explore what they see as obstacles to their own wellness and recovery. They will be guided to discuss their thoughts, feelings, and behaviors related to these obstacles. The group will process together ways to cope with barriers, with attention given to specific choices patients can make. Each patient will be challenged to identify changes they are motivated to make in order to overcome their obstacles. This group will be process-oriented, with patients participating in exploration of their own experiences as well as giving and receiving support and challenge from other group members.  Therapeutic Goals: 1. Patient will identify personal and current obstacles as they relate to admission. 2. Patient will identify barriers that currently interfere with their wellness or overcoming obstacles.  3. Patient will identify feelings, thought process and behaviors related to these barriers. 4. Patient will identify two changes they are willing to make to overcome these obstacles:    Summary of Patient Progress Patient did not attend group.   Therapeutic Modalities:   Cognitive Behavioral Therapy Solution Focused Therapy Motivational Interviewing Relapse Prevention Therapy   Nadara JONELLE Fam, LCSW "

## 2024-03-30 NOTE — Plan of Care (Signed)

## 2024-03-30 NOTE — Plan of Care (Signed)
   Problem: Education: Goal: Knowledge of Chris Olson General Education information/materials will improve Outcome: Progressing Goal: Emotional status will improve Outcome: Progressing Goal: Mental status will improve Outcome: Progressing Goal: Verbalization of understanding the information provided will improve Outcome: Progressing   Problem: Activity: Goal: Interest or engagement in activities will improve Outcome: Progressing Goal: Sleeping patterns will improve Outcome: Progressing   Problem: Coping: Goal: Ability to verbalize frustrations and anger appropriately will improve Outcome: Progressing Goal: Ability to demonstrate self-control will improve Outcome: Progressing

## 2024-03-31 ENCOUNTER — Encounter (HOSPITAL_COMMUNITY): Payer: Self-pay

## 2024-03-31 ENCOUNTER — Inpatient Hospital Stay
Admission: AD | Admit: 2024-03-31 | Discharge: 2024-04-03 | Disposition: A | Payer: MEDICAID | Source: Intra-hospital | Attending: Internal Medicine | Admitting: Internal Medicine

## 2024-03-31 ENCOUNTER — Inpatient Hospital Stay: Payer: MEDICAID

## 2024-03-31 DIAGNOSIS — F332 Major depressive disorder, recurrent severe without psychotic features: Secondary | ICD-10-CM | POA: Diagnosis present

## 2024-03-31 DIAGNOSIS — S82041A Displaced comminuted fracture of right patella, initial encounter for closed fracture: Principal | ICD-10-CM | POA: Diagnosis present

## 2024-03-31 DIAGNOSIS — F1910 Other psychoactive substance abuse, uncomplicated: Secondary | ICD-10-CM | POA: Diagnosis present

## 2024-03-31 DIAGNOSIS — F4323 Adjustment disorder with mixed anxiety and depressed mood: Secondary | ICD-10-CM | POA: Diagnosis present

## 2024-03-31 MED ORDER — MIRTAZAPINE 15 MG PO TABS
7.5000 mg | ORAL_TABLET | Freq: Every day | ORAL | Status: DC
  Administered 2024-04-01 – 2024-04-02 (×2): 7.5 mg via ORAL
  Filled 2024-03-31 (×2): qty 1

## 2024-03-31 MED ORDER — ONDANSETRON HCL 4 MG/2ML IJ SOLN: 4.0000 mg | Freq: Four times a day (QID) | INTRAMUSCULAR | Status: DC | PRN

## 2024-03-31 MED ORDER — HYDROXYZINE HCL 25 MG PO TABS
25.0000 mg | ORAL_TABLET | Freq: Three times a day (TID) | ORAL | Status: DC | PRN
  Filled 2024-03-31: qty 1

## 2024-03-31 MED ORDER — DOCUSATE SODIUM 100 MG PO CAPS
100.0000 mg | ORAL_CAPSULE | Freq: Every day | ORAL | Status: DC
  Administered 2024-04-01: 100 mg via ORAL
  Filled 2024-03-31: qty 1

## 2024-03-31 MED ORDER — SENNOSIDES-DOCUSATE SODIUM 8.6-50 MG PO TABS: 1.0000 | ORAL_TABLET | Freq: Every evening | ORAL | Status: DC | PRN

## 2024-03-31 MED ORDER — HYDROMORPHONE HCL 1 MG/ML IJ SOLN
0.2500 mg | INTRAMUSCULAR | Status: DC | PRN
  Administered 2024-04-01: 0.25 mg via INTRAVENOUS
  Filled 2024-03-31: qty 0.5

## 2024-03-31 MED ORDER — SODIUM CHLORIDE 0.9% FLUSH
3.0000 mL | Freq: Two times a day (BID) | INTRAVENOUS | Status: DC
  Administered 2024-04-01 – 2024-04-03 (×6): 3 mL via INTRAVENOUS

## 2024-03-31 MED ORDER — PANTOPRAZOLE SODIUM 40 MG PO TBEC
40.0000 mg | DELAYED_RELEASE_TABLET | Freq: Every day | ORAL | Status: DC
  Administered 2024-04-01 – 2024-04-03 (×3): 40 mg via ORAL
  Filled 2024-03-31 (×3): qty 1

## 2024-03-31 MED ORDER — CARIPRAZINE HCL 1.5 MG PO CAPS
3.0000 mg | ORAL_CAPSULE | Freq: Every day | ORAL | Status: DC
  Administered 2024-04-01 – 2024-04-03 (×3): 3 mg via ORAL
  Filled 2024-03-31 (×3): qty 2

## 2024-03-31 MED ORDER — CEFAZOLIN SODIUM-DEXTROSE 2-4 GM/100ML-% IV SOLN: 2.0000 g | INTRAVENOUS | Status: DC

## 2024-03-31 MED ORDER — NICOTINE POLACRILEX 2 MG MT GUM
4.0000 mg | CHEWING_GUM | OROMUCOSAL | Status: DC | PRN
  Administered 2024-04-01 – 2024-04-02 (×2): 4 mg via ORAL
  Filled 2024-03-31 (×2): qty 2

## 2024-03-31 MED ORDER — OXYCODONE HCL 5 MG PO TABS
5.0000 mg | ORAL_TABLET | Freq: Four times a day (QID) | ORAL | Status: DC | PRN
  Administered 2024-04-01: 5 mg via ORAL
  Filled 2024-03-31: qty 1

## 2024-03-31 MED ORDER — ACETAMINOPHEN 650 MG RE SUPP: 650.0000 mg | Freq: Four times a day (QID) | RECTAL | Status: DC | PRN

## 2024-03-31 MED ORDER — LAMOTRIGINE 25 MG PO TABS
150.0000 mg | ORAL_TABLET | Freq: Two times a day (BID) | ORAL | Status: DC
  Administered 2024-04-01 – 2024-04-03 (×5): 150 mg via ORAL
  Filled 2024-03-31 (×6): qty 6

## 2024-03-31 MED ORDER — DOCUSATE SODIUM 100 MG PO CAPS
100.0000 mg | ORAL_CAPSULE | Freq: Every day | ORAL | Status: DC
  Administered 2024-03-31: 100 mg via ORAL
  Filled 2024-03-31: qty 1

## 2024-03-31 MED ORDER — CEFAZOLIN SODIUM-DEXTROSE 2-4 GM/100ML-% IV SOLN
2.0000 g | INTRAVENOUS | Status: AC
  Administered 2024-04-01: 2 g via INTRAVENOUS
  Filled 2024-03-31: qty 100

## 2024-03-31 MED ORDER — ACETAMINOPHEN 325 MG PO TABS: 650.0000 mg | ORAL_TABLET | Freq: Four times a day (QID) | ORAL | Status: DC | PRN

## 2024-03-31 MED ORDER — LORAZEPAM 1 MG PO TABS
1.0000 mg | ORAL_TABLET | Freq: Once | ORAL | Status: AC
  Administered 2024-03-31: 1 mg via ORAL
  Filled 2024-03-31: qty 1

## 2024-03-31 MED ORDER — ONDANSETRON HCL 4 MG PO TABS: 4.0000 mg | ORAL_TABLET | Freq: Four times a day (QID) | ORAL | Status: DC | PRN

## 2024-03-31 NOTE — Progress Notes (Signed)
" ° ° °  PROCEDURAL EXPEDITER PROGRESS NOTE  Patient Name: Chris Olson.  DOB:1990-06-25 Date of Admission: 03/28/2024  Date of Assessment:03/31/24   -------------------------------------------------------------------------------------------------------------------   Brief clinical summary: pt is a 14 yr ild male having surgery on 04/01/24 for open reduction internal fixation patella right  Orders in place:  Yes   Communication with surgical team if no orders: n/a  Labs, test, and orders reviewed: yes  Requires surgical clearance:  No    -------------------------------------------------------------------------------------------------------------------  [place] Patient Care Command Expediter, Chris Olson Please contact us  directly via secure chat (search for [facility]) or by calling us  at [phone] [facility]).  "

## 2024-03-31 NOTE — Plan of Care (Signed)

## 2024-03-31 NOTE — Progress Notes (Signed)
 Pt informed of CT scan recommendation by ortho provider and NPO ordered that will start at midnight due to pending surgery expectation tomorrow. Pt verbalized understanding. Pt in his room in recliner resting.   Jenese Mischke S.,RN

## 2024-03-31 NOTE — Group Note (Signed)
 Recreation Therapy Group Note   Group Topic:Relaxation  Group Date: 03/31/2024 Start Time: 1500 End Time: 1540 Facilitators: Celestia Jeoffrey BRAVO, LRT, CTRS Location: Dayroom  Group Description: PMR (Progressive Muscle Relaxation). LRT educates patients on what PMR is and the benefits that come from it. Patients are asked to sit with their feet flat on the floor while sitting up and all the way back in their chair, if possible. LRT and pts follow a prompt through a speaker that requires you to tense and release different muscles in their body and focus on their breathing. During session, lights are off and soft music is being played. Pts are given a stress ball to use if needed.   Goal Area(s) Addressed:  Patients will be able to describe progressive muscle relaxation.  Patient will practice using relaxation technique. Patient will identify a new coping skill.  Patient will follow multistep directions to reduce anxiety and stress.   Affect/Mood: N/A   Participation Level: Did not attend    Clinical Observations/Individualized Feedback: Patient did not attend.  Plan: Continue to engage patient in RT group sessions 2-3x/week.   Jeoffrey BRAVO Celestia, LRT, CTRS 03/31/2024 3:55 PM

## 2024-03-31 NOTE — Progress Notes (Signed)
" °   03/31/24 1100  Psych Admission Type (Psych Patients Only)  Admission Status Involuntary  Psychosocial Assessment  Patient Complaints Isolation  Eye Contact Fair  Facial Expression Flat  Affect Appropriate to circumstance  Speech Logical/coherent  Interaction Other (Comment) (pleasant)  Motor Activity Slow;Unsteady  Appearance/Hygiene Unremarkable  Behavior Characteristics Cooperative;Appropriate to situation;Calm  Mood Pleasant  Thought Process  Coherency WDL  Content WDL  Delusions None reported or observed  Perception WDL  Hallucination None reported or observed  Judgment WDL  Confusion None  Danger to Self  Current suicidal ideation? Denies  Self-Injurious Behavior No self-injurious ideation or behavior indicators observed or expressed   Agreement Not to Harm Self Yes  Description of Agreement verbal  Danger to Others  Danger to Others None reported or observed   Per pt self inventory pt reports poor sleep due to nightmares. Pt reports goal for today is to continue to get my leg better, hopefully to see doctor soon so I'm able to go home. Pt reports to help meet his goal he will just rest and stop thinking negative and more positive. "

## 2024-03-31 NOTE — Progress Notes (Signed)
 PT Cancellation Note  Patient Details Name: Chris Olson. MRN: [id number] DOB: [birth year]   Cancelled Treatment:    Reason Eval/Treat Not Completed: Medical issues which prohibited therapy (Pt seen by orthopedics, plan is for ORIF on 04/01/24 with Dr. Tobie. WIll sign off and await orders for PT evaluation on POD1.)   3:17 PM, 03/31/24 [name], PT, DPT Physical Therapist - [facility]  [phone] (ASCOM)    Samer Dutton C 03/31/2024, 3:17 PM

## 2024-03-31 NOTE — Plan of Care (Signed)
" °  Problem: Education: Goal: Knowledge of [place] [facility] Education information/materials will improve Outcome: Progressing Goal: Emotional status will improve Outcome: Progressing Goal: Mental status will improve Outcome: Progressing Goal: Verbalization of understanding the information provided will improve Outcome: Progressing   Problem: Activity: Goal: Interest or engagement in activities will improve Outcome: Progressing Goal: Sleeping patterns will improve Outcome: Progressing   Problem: Coping: Goal: Ability to verbalize frustrations and anger appropriately will improve Outcome: Progressing Goal: Ability to demonstrate self-control will improve Outcome: Progressing   Problem: Health Behavior/Discharge Planning: Goal: Identification of resources available to assist in meeting health care needs will improve Outcome: Progressing Goal: Compliance with treatment plan for underlying cause of condition will improve Outcome: Progressing   Problem: Physical Regulation: Goal: Ability to maintain clinical measurements within normal limits will improve Outcome: Progressing   Problem: Safety: Goal: Periods of time without injury will increase Outcome: Progressing   Problem: Education: Goal: Knowledge of [facility] Education information/materials will improve Outcome: Progressing Goal: Emotional status will improve Outcome: Progressing Goal: Mental status will improve Outcome: Progressing Goal: Verbalization of understanding the information provided will improve Outcome: Progressing   Problem: Self-Concept: Goal: Ability to identify factors that promote anxiety will improve Outcome: Progressing Goal: Level of anxiety will decrease Outcome: Progressing Goal: Ability to modify response to factors that promote anxiety will improve Outcome: Progressing   Problem: Coping: Goal: Coping ability will improve Outcome: Progressing Goal: Will verbalize  feelings Outcome: Progressing   Problem: Education: Goal: Knowledge of General Education information will improve Description: Including pain rating scale, medication(s)/side effects and non-pharmacologic comfort measures Outcome: Progressing   Problem: Health Behavior/Discharge Planning: Goal: Ability to manage health-related needs will improve Outcome: Progressing   Problem: Clinical Measurements: Goal: Ability to maintain clinical measurements within normal limits will improve Outcome: Progressing Goal: Will remain free from infection Outcome: Progressing Goal: Diagnostic test results will improve Outcome: Progressing Goal: Respiratory complications will improve Outcome: Progressing Goal: Cardiovascular complication will be avoided Outcome: Progressing   Problem: Activity: Goal: Risk for activity intolerance will decrease Outcome: Progressing   Problem: Nutrition: Goal: Adequate nutrition will be maintained Outcome: Progressing   Problem: Safety: Goal: Ability to remain free from injury will improve Outcome: Progressing   Problem: Skin Integrity: Goal: Risk for impaired skin integrity will decrease Outcome: Progressing   Problem: Coping: Goal: Level of anxiety will decrease Outcome: Progressing   Problem: Elimination: Goal: Will not experience complications related to bowel motility Outcome: Progressing Goal: Will not experience complications related to urinary retention Outcome: Progressing   Problem: Pain Managment: Goal: General experience of comfort will improve and/or be controlled Outcome: Progressing   "

## 2024-03-31 NOTE — Plan of Care (Signed)
" °  Problem: Education: Goal: Knowledge of [facility] Education information/materials will improve Outcome: Progressing Goal: Emotional status will improve Outcome: Progressing Goal: Mental status will improve Outcome: Progressing Goal: Verbalization of understanding the information provided will improve Outcome: Progressing   Problem: Activity: Goal: Interest or engagement in activities will improve Outcome: Progressing Goal: Sleeping patterns will improve Outcome: Progressing   Problem: Coping: Goal: Ability to verbalize frustrations and anger appropriately will improve Outcome: Progressing Goal: Ability to demonstrate self-control will improve Outcome: Progressing   Problem: Health Behavior/Discharge Planning: Goal: Identification of resources available to assist in meeting health care needs will improve Outcome: Progressing Goal: Compliance with treatment plan for underlying cause of condition will improve Outcome: Progressing   Problem: Physical Regulation: Goal: Ability to maintain clinical measurements within normal limits will improve Outcome: Progressing   Problem: Safety: Goal: Periods of time without injury will increase Outcome: Progressing   Problem: Self-Concept: Goal: Ability to identify factors that promote anxiety will improve Outcome: Progressing Goal: Level of anxiety will decrease Outcome: Progressing Goal: Ability to modify response to factors that promote anxiety will improve Outcome: Progressing   Problem: Education: Goal: Knowledge of [facility] Education information/materials will improve Outcome: Progressing Goal: Emotional status will improve Outcome: Progressing Goal: Mental status will improve Outcome: Progressing Goal: Verbalization of understanding the information provided will improve Outcome: Progressing   Problem: Coping: Goal: Coping ability will improve Outcome: Progressing Goal: Will verbalize  feelings Outcome: Progressing   Problem: Education: Goal: Knowledge of General Education information will improve Description: Including pain rating scale, medication(s)/side effects and non-pharmacologic comfort measures Outcome: Progressing   Problem: Health Behavior/Discharge Planning: Goal: Ability to manage health-related needs will improve Outcome: Progressing   Problem: Clinical Measurements: Goal: Ability to maintain clinical measurements within normal limits will improve Outcome: Progressing Goal: Will remain free from infection Outcome: Progressing Goal: Diagnostic test results will improve Outcome: Progressing Goal: Respiratory complications will improve Outcome: Progressing Goal: Cardiovascular complication will be avoided Outcome: Progressing   Problem: Activity: Goal: Risk for activity intolerance will decrease Outcome: Progressing   Problem: Nutrition: Goal: Adequate nutrition will be maintained Outcome: Progressing   Problem: Coping: Goal: Level of anxiety will decrease Outcome: Progressing   Problem: Elimination: Goal: Will not experience complications related to bowel motility Outcome: Progressing Goal: Will not experience complications related to urinary retention Outcome: Progressing   Problem: Pain Managment: Goal: General experience of comfort will improve and/or be controlled Outcome: Progressing   Problem: Safety: Goal: Ability to remain free from injury will improve Outcome: Progressing   Problem: Skin Integrity: Goal: Risk for impaired skin integrity will decrease Outcome: Progressing   "

## 2024-04-01 ENCOUNTER — Inpatient Hospital Stay: Payer: MEDICAID | Admitting: Anesthesiology

## 2024-04-01 ENCOUNTER — Inpatient Hospital Stay: Payer: MEDICAID

## 2024-04-01 ENCOUNTER — Encounter: Payer: Self-pay | Admitting: Internal Medicine

## 2024-04-01 ENCOUNTER — Encounter: Payer: Self-pay | Attending: Internal Medicine

## 2024-04-01 LAB — BASIC METABOLIC PANEL WITH GFR
Anion gap: 8 (ref 5–15)
BUN: 14 mg/dL (ref 6–20)
CO2: 28 mmol/L (ref 22–32)
Calcium: 8.8 mg/dL — ABNORMAL LOW (ref 8.9–10.3)
Chloride: 99 mmol/L (ref 98–111)
Creatinine, Ser: 0.81 mg/dL (ref 0.61–1.24)
GFR, Estimated: >60 mL/min
Glucose, Bld: 97 mg/dL (ref 70–99)
Potassium: 4.7 mmol/L (ref 3.5–5.1)
Sodium: 135 mmol/L (ref 135–145)

## 2024-04-01 LAB — CBC
HCT: 38.8 % — ABNORMAL LOW (ref 39.0–52.0)
Hemoglobin: 13.8 g/dL (ref 13.0–17.0)
MCH: 33.7 pg (ref 26.0–34.0)
MCHC: 35.6 g/dL (ref 30.0–36.0)
MCV: 94.6 fL (ref 80.0–100.0)
Platelets: 196 10*3/uL (ref 150–400)
RBC: 4.1 MIL/uL — ABNORMAL LOW (ref 4.22–5.81)
RDW: 11.8 % (ref 11.5–15.5)
WBC: 7 10*3/uL (ref 4.0–10.5)
nRBC: 0 % (ref 0.0–0.2)

## 2024-04-01 LAB — GLUCOSE, CAPILLARY: Glucose-Capillary: 101 mg/dL — ABNORMAL HIGH (ref 70–99)

## 2024-04-01 LAB — MAGNESIUM: Magnesium: 2.2 mg/dL (ref 1.7–2.4)

## 2024-04-01 LAB — HIV ANTIBODY (ROUTINE TESTING W REFLEX): HIV Screen 4th Generation wRfx: NONREACTIVE

## 2024-04-01 MED ORDER — BUPIVACAINE HCL (PF) 0.5 % IJ SOLN
INTRAMUSCULAR | Status: DC | PRN
  Administered 2024-04-01: 10 mL

## 2024-04-01 MED ORDER — TRAMADOL HCL 50 MG PO TABS: 50.0000 mg | ORAL_TABLET | Freq: Four times a day (QID) | ORAL | Status: DC | PRN

## 2024-04-01 MED ORDER — KETOROLAC TROMETHAMINE 15 MG/ML IJ SOLN
15.0000 mg | Freq: Four times a day (QID) | INTRAMUSCULAR | Status: DC
  Administered 2024-04-01 – 2024-04-02 (×2): 15 mg via INTRAVENOUS
  Filled 2024-04-01 (×2): qty 1

## 2024-04-01 MED ORDER — ACETAMINOPHEN 500 MG PO TABS
1000.0000 mg | ORAL_TABLET | Freq: Three times a day (TID) | ORAL | Status: DC
  Administered 2024-04-01 – 2024-04-03 (×7): 1000 mg via ORAL
  Filled 2024-04-01 (×7): qty 2

## 2024-04-01 MED ORDER — FENTANYL CITRATE (PF) 50 MCG/ML IJ SOSY
50.0000 ug | PREFILLED_SYRINGE | Freq: Once | INTRAMUSCULAR | Status: AC
  Administered 2024-04-01: 50 ug via INTRAVENOUS

## 2024-04-01 MED ORDER — DEXMEDETOMIDINE HCL IN NACL 80 MCG/20ML IV SOLN
INTRAVENOUS | Status: DC | PRN
  Administered 2024-04-01: 12 ug via INTRAVENOUS
  Administered 2024-04-01: 8 ug via INTRAVENOUS

## 2024-04-01 MED ORDER — FENTANYL CITRATE (PF) 100 MCG/2ML IJ SOLN
INTRAMUSCULAR | Status: AC
  Filled 2024-04-01: qty 2

## 2024-04-01 MED ORDER — ONDANSETRON HCL 4 MG/2ML IJ SOLN
INTRAMUSCULAR | Status: DC | PRN
  Administered 2024-04-01: 4 mg via INTRAVENOUS

## 2024-04-01 MED ORDER — ACETAMINOPHEN 10 MG/ML IV SOLN
INTRAVENOUS | Status: AC
  Filled 2024-04-01: qty 100

## 2024-04-01 MED ORDER — PROPOFOL 10 MG/ML IV BOLUS
INTRAVENOUS | Status: DC | PRN
  Administered 2024-04-01: 200 mg via INTRAVENOUS

## 2024-04-01 MED ORDER — METOCLOPRAMIDE HCL 5 MG/ML IJ SOLN: 5.0000 mg | Freq: Three times a day (TID) | INTRAMUSCULAR | Status: DC | PRN

## 2024-04-01 MED ORDER — OXYCODONE HCL 5 MG PO TABS
ORAL_TABLET | ORAL | Status: AC
  Filled 2024-04-01: qty 1

## 2024-04-01 MED ORDER — ROCURONIUM BROMIDE 10 MG/ML (PF) SYRINGE
PREFILLED_SYRINGE | INTRAVENOUS | Status: AC
  Filled 2024-04-01: qty 10

## 2024-04-01 MED ORDER — DEXAMETHASONE SOD PHOSPHATE PF 10 MG/ML IJ SOLN
INTRAMUSCULAR | Status: AC
  Filled 2024-04-01: qty 1

## 2024-04-01 MED ORDER — ONDANSETRON HCL 4 MG PO TABS: 4.0000 mg | ORAL_TABLET | Freq: Four times a day (QID) | ORAL | Status: DC | PRN

## 2024-04-01 MED ORDER — ASPIRIN 325 MG PO TBEC
325.0000 mg | DELAYED_RELEASE_TABLET | Freq: Every day | ORAL | Status: DC
  Administered 2024-04-02 – 2024-04-03 (×2): 325 mg via ORAL
  Filled 2024-04-01 (×2): qty 1

## 2024-04-01 MED ORDER — MIDAZOLAM HCL 2 MG/2ML IJ SOLN
INTRAMUSCULAR | Status: AC
  Filled 2024-04-01: qty 2

## 2024-04-01 MED ORDER — LIDOCAINE HCL (PF) 1 % IJ SOLN
INTRAMUSCULAR | Status: AC
  Filled 2024-04-01: qty 5

## 2024-04-01 MED ORDER — MIDAZOLAM HCL (PF) 2 MG/2ML IJ SOLN
INTRAMUSCULAR | Status: DC | PRN
  Administered 2024-04-01: 2 mg via INTRAVENOUS

## 2024-04-01 MED ORDER — LIDOCAINE HCL (PF) 2 % IJ SOLN
INTRAMUSCULAR | Status: AC
  Filled 2024-04-01: qty 5

## 2024-04-01 MED ORDER — ROCURONIUM BROMIDE 100 MG/10ML IV SOLN
INTRAVENOUS | Status: DC | PRN
  Administered 2024-04-01: 20 mg via INTRAVENOUS
  Administered 2024-04-01: 10 mg via INTRAVENOUS
  Administered 2024-04-01: 30 mg via INTRAVENOUS
  Administered 2024-04-01: 20 mg via INTRAVENOUS
  Administered 2024-04-01: 70 mg via INTRAVENOUS

## 2024-04-01 MED ORDER — ACETAMINOPHEN 10 MG/ML IV SOLN: 1000.0000 mg | Freq: Once | INTRAVENOUS | Status: DC | PRN

## 2024-04-01 MED ORDER — OXYCODONE HCL 5 MG/5ML PO SOLN: 5.0000 mg | Freq: Once | ORAL | Status: AC | PRN

## 2024-04-01 MED ORDER — OXYCODONE HCL 5 MG PO TABS: 5.0000 mg | ORAL_TABLET | ORAL | Status: DC | PRN

## 2024-04-01 MED ORDER — BUPIVACAINE LIPOSOME 1.3 % IJ SUSP
INTRAMUSCULAR | Status: AC
  Filled 2024-04-01: qty 10

## 2024-04-01 MED ORDER — FENTANYL CITRATE (PF) 50 MCG/ML IJ SOSY
PREFILLED_SYRINGE | INTRAMUSCULAR | Status: AC
  Filled 2024-04-01: qty 1

## 2024-04-01 MED ORDER — BISACODYL 10 MG RE SUPP: 10.0000 mg | Freq: Every day | RECTAL | Status: DC | PRN

## 2024-04-01 MED ORDER — SODIUM CHLORIDE 0.9 % IV SOLN: INTRAVENOUS | Status: DC

## 2024-04-01 MED ORDER — CEFAZOLIN SODIUM-DEXTROSE 2-4 GM/100ML-% IV SOLN
2.0000 g | Freq: Four times a day (QID) | INTRAVENOUS | Status: AC
  Administered 2024-04-01 – 2024-04-02 (×3): 2 g via INTRAVENOUS
  Filled 2024-04-01 (×3): qty 100

## 2024-04-01 MED ORDER — OXYCODONE HCL 5 MG PO TABS
2.5000 mg | ORAL_TABLET | ORAL | Status: DC | PRN
  Administered 2024-04-01 – 2024-04-02 (×3): 5 mg via ORAL
  Filled 2024-04-01 (×3): qty 1

## 2024-04-01 MED ORDER — BUPIVACAINE LIPOSOME 1.3 % IJ SUSP
INTRAMUSCULAR | Status: DC | PRN
  Administered 2024-04-01: 10 mL

## 2024-04-01 MED ORDER — BUPIVACAINE HCL (PF) 0.5 % IJ SOLN
INTRAMUSCULAR | Status: AC
  Filled 2024-04-01: qty 10

## 2024-04-01 MED ORDER — HYDROMORPHONE HCL 1 MG/ML IJ SOLN
0.2000 mg | INTRAMUSCULAR | Status: DC | PRN
  Administered 2024-04-01: 0.4 mg via INTRAVENOUS
  Filled 2024-04-01: qty 0.5

## 2024-04-01 MED ORDER — DROPERIDOL 2.5 MG/ML IJ SOLN: 0.6250 mg | Freq: Once | INTRAMUSCULAR | Status: DC | PRN

## 2024-04-01 MED ORDER — LACTATED RINGERS IV SOLN: INTRAVENOUS | Status: DC | PRN

## 2024-04-01 MED ORDER — SUGAMMADEX SODIUM 200 MG/2ML IV SOLN
INTRAVENOUS | Status: DC | PRN
  Administered 2024-04-01: 200 mg via INTRAVENOUS

## 2024-04-01 MED ORDER — 0.9 % SODIUM CHLORIDE (POUR BTL) OPTIME
TOPICAL | Status: DC | PRN
  Administered 2024-04-01: 200 mL

## 2024-04-01 MED ORDER — BUPIVACAINE-EPINEPHRINE (PF) 0.5% -1:200000 IJ SOLN
INTRAMUSCULAR | Status: AC
  Filled 2024-04-01: qty 10

## 2024-04-01 MED ORDER — BUPIVACAINE-EPINEPHRINE (PF) 0.5% -1:200000 IJ SOLN
INTRAMUSCULAR | Status: DC | PRN
  Administered 2024-04-01: 10 mL

## 2024-04-01 MED ORDER — ACETAMINOPHEN 10 MG/ML IV SOLN
INTRAVENOUS | Status: DC | PRN
  Administered 2024-04-01: 1000 mg via INTRAVENOUS

## 2024-04-01 MED ORDER — PROPOFOL 10 MG/ML IV BOLUS
INTRAVENOUS | Status: AC
  Filled 2024-04-01: qty 20

## 2024-04-01 MED ORDER — METHOCARBAMOL 1000 MG/10ML IJ SOLN: 500.0000 mg | Freq: Four times a day (QID) | INTRAMUSCULAR | Status: DC | PRN

## 2024-04-01 MED ORDER — ONDANSETRON HCL 4 MG/2ML IJ SOLN: 4.0000 mg | Freq: Four times a day (QID) | INTRAMUSCULAR | Status: DC | PRN

## 2024-04-01 MED ORDER — FENTANYL CITRATE (PF) 100 MCG/2ML IJ SOLN
25.0000 ug | INTRAMUSCULAR | Status: DC | PRN
  Administered 2024-04-01 (×2): 50 ug via INTRAVENOUS

## 2024-04-01 MED ORDER — SENNA 8.6 MG PO TABS
1.0000 | ORAL_TABLET | Freq: Every day | ORAL | Status: DC
  Administered 2024-04-01 – 2024-04-03 (×3): 8.6 mg via ORAL
  Filled 2024-04-01 (×3): qty 1

## 2024-04-01 MED ORDER — LIDOCAINE HCL (CARDIAC) PF 100 MG/5ML IV SOSY
PREFILLED_SYRINGE | INTRAVENOUS | Status: DC | PRN
  Administered 2024-04-01: 100 mg via INTRAVENOUS

## 2024-04-01 MED ORDER — OXYCODONE HCL 5 MG PO TABS
5.0000 mg | ORAL_TABLET | Freq: Once | ORAL | Status: AC | PRN
  Administered 2024-04-01: 5 mg via ORAL

## 2024-04-01 MED ORDER — METOCLOPRAMIDE HCL 10 MG PO TABS: 5.0000 mg | ORAL_TABLET | Freq: Three times a day (TID) | ORAL | Status: DC | PRN

## 2024-04-01 MED ORDER — ONDANSETRON HCL 4 MG/2ML IJ SOLN
INTRAMUSCULAR | Status: AC
  Filled 2024-04-01: qty 2

## 2024-04-01 MED ORDER — FLEET ENEMA RE ENEM: 1.0000 | ENEMA | Freq: Once | RECTAL | Status: DC | PRN

## 2024-04-01 MED ORDER — FENTANYL CITRATE (PF) 100 MCG/2ML IJ SOLN
INTRAMUSCULAR | Status: DC | PRN
  Administered 2024-04-01 (×3): 50 ug via INTRAVENOUS

## 2024-04-01 MED ORDER — SENNOSIDES-DOCUSATE SODIUM 8.6-50 MG PO TABS
1.0000 | ORAL_TABLET | Freq: Every evening | ORAL | Status: DC | PRN
  Administered 2024-04-01: 1 via ORAL
  Filled 2024-04-01: qty 1

## 2024-04-01 MED ORDER — DEXAMETHASONE SOD PHOSPHATE PF 10 MG/ML IJ SOLN
INTRAMUSCULAR | Status: DC | PRN
  Administered 2024-04-01: 10 mg via INTRAVENOUS

## 2024-04-01 MED ORDER — METHOCARBAMOL 500 MG PO TABS
500.0000 mg | ORAL_TABLET | Freq: Four times a day (QID) | ORAL | Status: DC | PRN
  Administered 2024-04-01: 500 mg via ORAL
  Filled 2024-04-01: qty 1

## 2024-04-01 NOTE — BH Assessment (Signed)
 IVC PAPERS  RESCINDED  PT  NOW  VOL
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
## 2024-04-01 NOTE — Anesthesia Procedure Notes (Signed)
 Procedure Name: Intubation Date/Time: 04/01/2024 12:00 PM  Performed by: Jackye Spanner, CRNAPre-anesthesia Checklist: Patient identified, Patient being monitored, Timeout performed, Emergency Drugs available and Suction available Patient Re-evaluated:Patient Re-evaluated prior to induction Oxygen Delivery Method: Circle system utilized Preoxygenation: Pre-oxygenation with 100% oxygen Induction Type: IV induction Ventilation: Mask ventilation without difficulty Laryngoscope Size: McGrath and 4 Grade View: Grade I Tube type: Oral Tube size: 7.5 mm Number of attempts: 1 Airway Equipment and Method: Stylet Placement Confirmation: ETT inserted through vocal cords under direct vision, positive ETCO2 and breath sounds checked- equal and bilateral Secured at: 20 cm Tube secured with: Tape Dental Injury: Teeth and Oropharynx as per pre-operative assessment

## 2024-04-01 NOTE — Progress Notes (Signed)
" °  [facility] Adult Case Management Discharge Plan :  Will you be returning to the same living situation after discharge:  No. Pt discharged to medical floor for surgery. At discharge, do you have transportation home?: Yes,  pt discharged to medical floor for surgery.  Do you have the ability to pay for your medications: Yes,  [employer]   Release of information consent forms completed and in the chart;  Patient's signature needed at discharge.  Patient to Follow up at:   Next level of care provider has access to [facility] Link:no  Safety Planning and Suicide Prevention discussed: No. Pt expected to return to unit once cleared medically.      Has patient been referred to the Quitline?: Patient refused referral for treatment  Patient has been referred for addiction treatment: Patient refused referral for treatment.  Nadara JONELLE Fam, LCSW 04/01/2024, 12:25 PM "

## 2024-04-01 NOTE — Progress Notes (Signed)
 Pt lying in bed, crying softly.  Pt expresses he wants his girlfriend to visit him.
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
## 2024-04-01 NOTE — H&P (Signed)
 H&P reviewed and  prior consult note reviewed. No significant changes noted.

## 2024-04-01 NOTE — Anesthesia Preprocedure Evaluation (Addendum)
"                                    Anesthesia Evaluation  Patient identified by MRN, date of birth, ID band Patient awake    Reviewed: Allergy & Precautions, H&P , NPO status , Patient's Chart, lab work & pertinent test results  Airway Mallampati: II  TM Distance: >3 FB Neck ROM: full    Dental no notable dental hx.    Pulmonary Current Smoker   Pulmonary exam normal        Cardiovascular negative cardio ROS Normal cardiovascular exam     Neuro/Psych  PSYCHIATRIC DISORDERS    Schizophrenia  negative neurological ROS     GI/Hepatic negative GI ROS,,,(+)     substance abuse  alcohol use and cocaine use  Endo/Other  negative endocrine ROS    Renal/GU      Musculoskeletal   Abdominal   Peds  Hematology negative hematology ROS (+)   Anesthesia Other Findings Cocaine Positive on admission  Past Medical History: No date: Alcoholism (HCC) No date: Anxiety No date: Chest pain 11/11/2017: Cocaine abuse with cocaine-induced mood disorder (HCC) No date: Depression No date: Pericarditis No date: Schizophrenia (HCC)  No past surgical history on file.  BMI    Body Mass Index: 20.47 kg/m      Reproductive/Obstetrics negative OB ROS                              Anesthesia Physical Anesthesia Plan  ASA: 3  Anesthesia Plan: General ETT   Post-op Pain Management: Tylenol  PO (pre-op)*, Celebrex PO (pre-op)* and Regional block*   Induction: Intravenous  PONV Risk Score and Plan: 2 and Ondansetron , Dexamethasone  and Midazolam   Airway Management Planned: Oral ETT  Additional Equipment:   Intra-op Plan:   Post-operative Plan: Extubation in OR  Informed Consent:      Dental Advisory Given  Plan Discussed with: CRNA and Surgeon  Anesthesia Plan Comments:          Anesthesia Quick Evaluation  "

## 2024-04-01 NOTE — Transfer of Care (Signed)
 Immediate Anesthesia Transfer of Care Note  Patient: Chris Olson.  Procedure(s) Performed: OPEN REDUCTION INTERNAL FIXATION (ORIF) PATELLA (Right: Knee)  Patient Location: PACU  Anesthesia Type:General  Level of Consciousness: drowsy  Airway & Oxygen Therapy: Patient Spontanous Breathing and Patient connected to face mask oxygen  Post-op Assessment: Report given to RN and Post -op Vital signs reviewed and stable  Post vital signs: Reviewed and stable  Last Vitals:  Vitals Value Taken Time  BP 121/68 04/01/24 15:00  Temp 36.1 1500  Pulse 78 04/01/24 15:01  Resp 18 04/01/24 15:01  SpO2 100 % 04/01/24 15:01  Vitals shown include unfiled device data.  Last Pain:  Vitals:   04/01/24 1114  TempSrc: Temporal  PainSc: 6          Complications: No notable events documented.

## 2024-04-01 NOTE — Anesthesia Procedure Notes (Signed)
 Anesthesia Regional Block: Adductor canal block   Pre-Anesthetic Checklist: , timeout performed,  Correct Patient, Correct Site, Correct Laterality,  Correct Procedure, Correct Position, site marked,  Risks and benefits discussed,  Surgical consent,  Pre-op evaluation,  At surgeon's request and post-op pain management  Laterality: Right and Lower  Prep: chloraprep       Needles:  Injection technique: Single-shot  Needle Type: Echogenic Needle     Needle Length: 10cm  Needle Gauge: 21     Additional Needles:   Procedures:,,,, ultrasound used (permanent image in chart),,    Narrative:  Start time: 04/01/2024 11:57 AM End time: 04/01/2024 11:58 AM Injection made incrementally with aspirations every 5 mL.  Performed by: Personally

## 2024-04-01 NOTE — Anesthesia Postprocedure Evaluation (Signed)
"   Anesthesia Post Note  Patient: Chris Patron Jr.  Procedure(s) Performed: OPEN REDUCTION INTERNAL FIXATION (ORIF) PATELLA (Right: Knee)  Patient location during evaluation: PACU Anesthesia Type: General Level of consciousness: awake and alert Pain management: pain level controlled Vital Signs Assessment: post-procedure vital signs reviewed and stable Respiratory status: spontaneous breathing, nonlabored ventilation, respiratory function stable and patient connected to nasal cannula oxygen Cardiovascular status: blood pressure returned to baseline and stable Postop Assessment: no apparent nausea or vomiting Anesthetic complications: no   No notable events documented.   Last Vitals:  Vitals:   04/01/24 1156 04/01/24 1500  BP: 118/71 121/68  Pulse: 74 79  Resp: 16 18  Temp:  (!) 36.3 C  SpO2: 100% 100%    Last Pain:  Vitals:   04/01/24 1500  TempSrc:   PainSc: Asleep                 Chris Olson      "

## 2024-04-01 NOTE — Plan of Care (Signed)

## 2024-04-01 NOTE — Discharge Instructions (Signed)
 Food Resources  Agency Name: [facility] Agency Address: [address], [place], [place] [address] Phone: [phone] Website: [email or web address] Service(s) Offered: Housing services, self-sufficiency, congregate meal program, weatherization program, Event organiser program, emergency food assistance,  housing counseling, home ownership program, wheels - to work program.  [employer] free for 60 and older at various locations from [employer], Monday-Friday:  [employer], [address] [place], [phone] -[facility], [address], [place] [phone]  -[facility], [address], [place] [phone]  -[address] [place], [phone]  Agency Name: [facility] Address: [id number], [place], [place] [address] Phone: [phone] Website: [email or web address] Service(s) Offered: Home delivered hot, frozen, and emergency  meals. Grocery assistance program which matches  volunteers one-on-one with seniors unable to grocery shop  for themselves. Must be 60 years and older; less than 20  hours of in-home aide service, limited or no driving ability;  live alone or with someone with a disability; live in  [place].  Agency Name: [facility] [employer]) Address: [address] [place], [place] [address] Phone: [phone] Service(s) Offered: Food is served to shut-ins, homeless, elderly, and low income people in the community every Saturday (11:30 am-12:30 pm) and Sunday (12:30 pm-1:30pm). Volunteers also offer help and encouragement in seeking employment,  and spiritual guidance.  Agency Name: [employer] Address: [address] [place], [place] [address] Phone: [phone] Service(s) Offered: Child support services; child welfare services; food stamps; Medicaid; work first family assistance; and aid  with fuel,  rent, food and medicine.  Agency Name: [employer] Address: [address], [place], [place] Phone: [phone] Website: [email or web address] Services Offered: Monday 10:00am-12:00, 8:00pm-9:00pm, and Friday 10:00am-12:00.  Agency Name: [employer] [facility] [place] Address: [address] [place], [place] [address] Phone: [phone] Website: [email or web address] Service(s) Offered: Serves weekday meals, open from 11:30 am- 1:00 pm., and 6:30-7:30pm, Monday-Wednesday-Friday distributes food 3:30-6pm, Monday-Wednesday-Friday.  Agency Name: [facility] Address: [address], [place], [place] Phone: [phone] Website: [email or web address] Services Offered: Distributes food the 4th Saturday of the month, starting at 8:00 am  Agency Name: [facility] Address: [phone], [place], [place] [address] Phone: [phone] Website: [email or web address] Service(s) Offered: Bread of life, weekly food pantry. Open Wednesdays from 10:00am-noon.  Agency Name: [facility] [employer] Address: [address] [place], [place], [place] Phone: [phone] Services Offered: Distributes food 9am-1pm, Monday-Thursday. Call for details.  Agency Name: [facility] Address: [address], [place], [place] [address] Phone: [phone] Website: [email or web address] Service(s) Offered: Games developer. Call for assistance.  Agency Name: [facility] Address: [address] [place], [place] [address] Phone: [phone] Service Offered: Emergency Food Pantry. Call for appointment.  Agency Name: [facility] Address: [address], [place], [place] [address] Phone: [phone] Website: [email or web address] Services Offered: Games developer. Call for details  Agency Name: [facility] Address: [address] [place], [place] Phone:  [phone] Website: [email or web address] Service(s) Offered: Emergency Food Pantry. Call for details.  Agency Name: Holiday representative Address: [address] [place], [place] [address] Phone: [phone] or [phone] Website: [email or web address] Service(s) Offered: Distribute food 9am-11:30 am, Tuesday-Friday, and 1-3:30pm, Monday-Friday. Food pantry Monday-Friday 1pm-3pm, fresh items, Mon.-Wed.-Fri.  Agency Name: [facility] Address: [address] [place], [place] [address] Phone: [phone] Website: [email or web address] Services Offered: Distribute food Tues and Sats from 9:00am-noon.  Closed 1st Saturday of each month. Call for details  Agency Name: [employer] Address: Fayrene Boatman [facility] [address] [place] [address] Phone: [phone]  Services Offered: Delivers meals every Thursday   Do you feel isolated?  [facility] [employer] [facility] offers a [employer] that anyone can call toll free at [phone]. The friendship line is available 24 hours a day  [employer] is a Program of All-inclusive Care for the Elderly (PACE). Their mission is to promote and sustain the independence of seniors wishing to remain in the community. They provide seniors with comprehensive Boehner-term health, social, medical and dietary care. Their program is a safe alternative to nursing home care. [phone]  [facility] Address [facility] [address] [place], [place] [address] Phone: [phone]. . Online zoom yoga class, connect with others without leaving your home [facility] offers [place] dance cardio sessions for individuals via Zoom. This program provides: - Dance fitness activities Please contact program for more information. Servinganyone in need adults 18+ hiv/aids individuals families Call [phone]  Email [email or web address] [email or web address] to get more info  [employer]  offers an online [employer] to individuals where they can receive help to focus on their best health. Whether you're a [employer] member or not, the neighborhood center offers a... Main Serviceshealth education  exercise & fitness  community support services  recreation  virtual support Other Servicessupport groups Servinganyone in need adults young adults teens seniors individuals families [email or web address] to get more info  Schedule on their website  [facility] offers an array of activities for adults age 8 and over. This program provides:- Fitness and health programs- Tech classes- Activity books Main Serviceshealth education  community support services  exercise & fitness  recreation  more education Servingseniors  Call [phone]    For more resources go online to [email or web address].[email or web address].uk and type in you zipcode  [facility]  POST OPERATIVE INSTRUCTIONS FOR DR. ASHLEY AND DR. BAKER [facility] CLINIC PODIATRY DEPARTMENT   Take your medication as prescribed.  Pain medication should be taken only as needed.  Keep the dressing clean, dry and intact.  Keep your foot elevated above the heart level for the first 48 hours.  Walking to the bathroom and brief periods of walking are acceptable, unless we have instructed you to be non-weight bearing.  Always wear your post-op shoe when walking.  Always use your crutches if you are to be non-weight bearing.  Do not take a shower. Baths are permissible as [place] as the foot is kept out of the water.   Every hour you are awake:  Bend your knee 15 times. Flex foot 15 times Massage calf 15 times  Call [facility] [phone]) if any of the following problems occur: You develop a temperature or fever. The bandage becomes saturated with blood. Medication does not stop your pain. Injury of the foot occurs. Any symptoms of infection including  redness, odor, or red streaks running from wound.

## 2024-04-02 LAB — GLUCOSE, CAPILLARY: Glucose-Capillary: 117 mg/dL — ABNORMAL HIGH (ref 70–99)

## 2024-04-02 MED ORDER — OXYCODONE HCL 5 MG PO TABS
5.0000 mg | ORAL_TABLET | ORAL | Status: DC | PRN
  Administered 2024-04-02: 10 mg via ORAL
  Administered 2024-04-03: 5 mg via ORAL
  Administered 2024-04-03: 10 mg via ORAL
  Administered 2024-04-03: 5 mg via ORAL
  Filled 2024-04-02 (×2): qty 2
  Filled 2024-04-02 (×2): qty 1

## 2024-04-02 MED ORDER — OXYCODONE HCL 5 MG PO TABS: 2.5000 mg | ORAL_TABLET | ORAL | 0 refills | Status: DC | PRN

## 2024-04-02 MED ORDER — HYDROMORPHONE HCL 1 MG/ML IJ SOLN: 0.5000 mg | INTRAMUSCULAR | Status: DC | PRN

## 2024-04-02 MED ORDER — KETOROLAC TROMETHAMINE 15 MG/ML IJ SOLN
15.0000 mg | Freq: Four times a day (QID) | INTRAMUSCULAR | Status: DC
  Administered 2024-04-02 – 2024-04-03 (×4): 15 mg via INTRAVENOUS
  Filled 2024-04-02 (×4): qty 1

## 2024-04-02 MED ORDER — ENOXAPARIN SODIUM 40 MG/0.4ML IJ SOSY
40.0000 mg | PREFILLED_SYRINGE | INTRAMUSCULAR | Status: DC
  Administered 2024-04-02 – 2024-04-03 (×2): 40 mg via SUBCUTANEOUS
  Filled 2024-04-02 (×2): qty 0.4

## 2024-04-02 MED ORDER — ASPIRIN 325 MG PO TBEC: 325.0000 mg | DELAYED_RELEASE_TABLET | Freq: Every day | ORAL | Status: AC

## 2024-04-02 MED ORDER — METHOCARBAMOL 500 MG PO TABS
500.0000 mg | ORAL_TABLET | Freq: Three times a day (TID) | ORAL | Status: DC
  Administered 2024-04-02 – 2024-04-03 (×4): 500 mg via ORAL
  Filled 2024-04-02 (×4): qty 1

## 2024-04-02 MED ORDER — TRAMADOL HCL 50 MG PO TABS: 50.0000 mg | ORAL_TABLET | Freq: Four times a day (QID) | ORAL | 0 refills | Status: DC | PRN

## 2024-04-02 NOTE — Progress Notes (Signed)
" °  Subjective: 1 Day Post-Op Procedures (LRB): OPEN REDUCTION INTERNAL FIXATION (ORIF) PATELLA (Right) Patient reports pain as mild.   Patient is well, and has had no acute complaints or problems Plan is to go Home after hospital stay. Negative for chest pain and shortness of breath Fever: no Gastrointestinal: Negative for nausea and vomiting  Objective: Vital signs in last 24 hours: Temp:  [97.4 F (36.3 C)-98.6 F (37 C)] 98.1 F (36.7 C) (02/05 0423) Pulse Rate:  [64-93] 70 (02/05 0423) Resp:  [14-24] 20 (02/05 0423) BP: (118-139)/(68-96) 125/82 (02/05 0423) SpO2:  [98 %-100 %] 100 % (02/05 0423) Weight:  [62.9 kg] 62.9 kg (02/05 0500)  Intake/Output from previous day:  Intake/Output Summary (Last 24 hours) at 04/02/2024 0803 Last data filed at 04/02/2024 0500 Gross per 24 hour  Intake 2081.26 ml  Output 1805 ml  Net 276.26 ml    Intake/Output this shift: No intake/output data recorded.  Labs: Recent Labs    04/01/24 0614  HGB 13.8   Recent Labs    04/01/24 0614  WBC 7.0  RBC 4.10*  HCT 38.8*  PLT 196   Recent Labs    04/01/24 0728  NA 135  K 4.7  CL 99  CO2 28  BUN 14  CREATININE 0.81  GLUCOSE 97  CALCIUM 8.8*   No results for input(s): LABPT, INR in the last 72 hours.   EXAM General - Patient is Alert and Oriented Extremity - Neurovascular intact Sensation intact distally Dorsiflexion/Plantar flexion intact Dressing/Incision - clean, dry, no drainage, with the knee immobilizer locked in extension. Motor Function - intact, moving foot and toes well on exam.   Past Medical History:  Diagnosis Date   Alcoholism (HCC)    Anxiety    Chest pain    Cocaine abuse with cocaine-induced mood disorder (HCC) 11/11/2017   Depression    Pericarditis    Schizophrenia (HCC)     Assessment/Plan: 1 Day Post-Op Procedures (LRB): OPEN REDUCTION INTERNAL FIXATION (ORIF) PATELLA (Right) Principal Problem:   Displaced comminuted fracture of right  patella, initial encounter for closed fracture Active Problems:   Polysubstance abuse (HCC)   Adjustment disorder with mixed anxiety and depressed mood   MDD (major depressive disorder), recurrent severe, without psychosis (HCC)  Estimated body mass index is 21.09 kg/m as calculated from the following:   Height as of 03/28/24: 5' 8 (1.727 m).   Weight as of this encounter: 62.9 kg. Advance diet Up with therapy  Discharge planning: No dressing change at this time.  No showering or getting.  Bandage wet.  Knee immobilizer locked in extension at all times with no flexion.  Weightbearing as tolerated on the right.  Follow[facility]up at [facility] clinic orthopedics in 2 weeks for wound care.  DVT Prophylaxis - Aspirin  Weight-Bearing as tolerated to right leg in the brace locked in extension.  Krystal Doyne, PA-C Orthopaedic Surgery 04/02/2024, 8:03 AM  "

## 2024-04-02 NOTE — TOC CM/SW Note (Signed)
 Transition of Care (TOC) CM/SW Note    Transition of Care [facility]) - Inpatient Brief Assessment   Patient Details  Name: Chris Olson. MRN: [id number] Date of Birth: [birth year]  Transition of Care [facility]) CM/SW Contact:    Alvaro Louder, LCSW Phone Number: 04/02/2024, 9:57 AM   Clinical Narrative:  Per chart review TOC consulted for SNF Placement/ Home health. LCSWA to follow recommendations placed by PT and OT.  TOC to follow for discharge  Transition of Care Asessment: Insurance and Status: Insurance coverage has been reviewed Patient has primary care physician: Yes Home environment has been reviewed: single family home Prior level of function:: Ox4 Prior/Current Home Services: No current home services Social Drivers of Health Review: SDOH reviewed no interventions necessary Readmission risk has been reviewed: Yes Transition of care needs: no transition of care needs at this time
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
## 2024-04-02 NOTE — TOC CM/SW Note (Signed)
 Transition of Care [facility]) CM/SW Note    Occupational Therapy * Physical Therapy * Speech Therapy  DATE 04/02/24 PATIENT NAME Chris Olson.  PATIENT MRN  MRN: [id number]   DIAGNOSIS/DIAGNOSIS CODE   S82.041A  DATE OF DISCHARGE 04/03/24  PRIMARY CARE PHYSICIAN [facility]  PCP [facility]  [phone]    Dear Provider    I certify that I have examined this patient and that occupational/physical/speech therapy is necessary on an outpatient basis.    The patient has expressed interest in completing their recommended course of therapy at your location.  Once a formal order from the patient's primary care physician has been obtained, please contact him/her to schedule an appointment for evaluation at your earliest convenience.  [ x ]  Physical Therapy Evaluate and Treat  [ x ]  Occupational Therapy Evaluate and Treat  [  ]  Speech Therapy Evaluate and Treat  The patient's primary care physician (listed above) must furnish and be responsible for a formal order such that the recommended services may be furnished while under the primary physician's care, and that the plan of care will be established and reviewed every 30 days (or more often if condition necessitates).
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
## 2024-04-03 ENCOUNTER — Encounter: Payer: Self-pay | Admitting: Orthopedic Surgery

## 2024-04-03 ENCOUNTER — Other Ambulatory Visit: Payer: Self-pay

## 2024-04-03 MED ORDER — HYDROXYZINE HCL 25 MG PO TABS
25.0000 mg | ORAL_TABLET | Freq: Three times a day (TID) | ORAL | 0 refills | Status: AC | PRN
  Filled 2024-04-03: qty 30, 10d supply, fill #0

## 2024-04-03 MED ORDER — SENNA 8.6 MG PO TABS
1.0000 | ORAL_TABLET | Freq: Every day | ORAL | 0 refills | Status: AC
  Filled 2024-04-03: qty 30, 30d supply, fill #0

## 2024-04-03 MED ORDER — METHOCARBAMOL 500 MG PO TABS
500.0000 mg | ORAL_TABLET | Freq: Three times a day (TID) | ORAL | 0 refills | Status: AC
  Filled 2024-04-03: qty 30, 10d supply, fill #0

## 2024-04-03 MED ORDER — CARIPRAZINE HCL 3 MG PO CAPS
3.0000 mg | ORAL_CAPSULE | Freq: Every day | ORAL | 0 refills | Status: AC
  Filled 2024-04-03: qty 30, 30d supply, fill #0

## 2024-04-03 MED ORDER — OMEPRAZOLE 20 MG PO CPDR
20.0000 mg | DELAYED_RELEASE_CAPSULE | Freq: Every day | ORAL | 0 refills | Status: AC
  Filled 2024-04-03: qty 30, 30d supply, fill #0

## 2024-04-03 MED ORDER — LAMOTRIGINE 150 MG PO TABS
150.0000 mg | ORAL_TABLET | Freq: Two times a day (BID) | ORAL | 0 refills | Status: AC
  Filled 2024-04-03: qty 60, 30d supply, fill #0

## 2024-04-03 MED ORDER — ACETAMINOPHEN 500 MG PO TABS: 1000.0000 mg | ORAL_TABLET | Freq: Four times a day (QID) | ORAL | Status: AC | PRN

## 2024-04-03 MED ORDER — OXYCODONE HCL 5 MG PO TABS
5.0000 mg | ORAL_TABLET | ORAL | 0 refills | Status: AC | PRN
  Filled 2024-04-03: qty 30, 3d supply, fill #0

## 2024-04-03 MED ORDER — MIRTAZAPINE 7.5 MG PO TABS
7.5000 mg | ORAL_TABLET | Freq: Every day | ORAL | 0 refills | Status: AC
  Filled 2024-04-03: qty 30, 30d supply, fill #0

## 2024-04-03 NOTE — TOC Transition Note (Signed)
 Transition of Care [facility]) - Discharge Note   Patient Details  Name: Chris Olson. MRN: [id number] Date of Birth: 05-28-1990  Transition of Care [facility]) CM/SW Contact:  Alvaro Louder, LCSW Phone Number: 04/03/2024, 4:14 PM   Clinical Narrative:   LCSWA discussed with patient that her  insurance did not cover HH. LCSWA pivoted to OP rehab. LCSWA faxed out OP rehab documentation to start services. The patient reported that she would have her husband come pick him up at discharge. LCSWA discussed PT recommendation  of RW and BSC 3in1. Patient was agreeable, LCSWA reached out to [employer] coordinator to set up equipment delivery.  TOC signing off  Final next level of care: OP Rehab     Patient Goals and CMS Choice            Discharge Placement              Patient chooses bed at:  (Home) Patient to be transferred to facility by: Girlfriend Name of family member notified: Self Patient and family notified of of transfer: 04/03/24  Discharge Plan and Services Additional resources added to the After Visit Summary for                  DME Arranged: Walker rolling, Bedside commode DME Agency: [employer] Date DME Agency Contacted: 04/03/24   Representative spoke with at DME Agency: Thomasina            Social Drivers of Health (SDOH) Interventions SDOH Screenings   Food Insecurity: Food Insecurity Present (03/31/2024)  Housing: Low Risk (03/31/2024)  Recent Concern: Housing - High Risk (03/28/2024)  Transportation Needs: No Transportation Needs (03/31/2024)  Recent Concern: Transportation Needs - Unmet Transportation Needs (03/28/2024)  Utilities: Not At Risk (03/31/2024)  Alcohol Screen: High Risk (03/28/2024)  Depression (PHQ2-9): High Risk (09/13/2021)  Social Connections: Socially Isolated (03/31/2024)  Tobacco Use: High Risk (03/28/2024)     Readmission Risk Interventions     No data to display
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
## 2024-04-03 NOTE — Progress Notes (Signed)
" °  Subjective: 2 Days Post-Op Procedures (LRB): OPEN REDUCTION INTERNAL FIXATION (ORIF) PATELLA (Right) Patient reports pain as mild.   Patient is well, and has had no acute complaints or problems Plan is to go Home after hospital stay. Negative for chest pain and shortness of breath Fever: no Gastrointestinal: Negative for nausea and vomiting  Objective: Vital signs in last 24 hours: Temp:  [98 F (36.7 C)-98.3 F (36.8 C)] 98 F (36.7 C) (02/06 0804) Pulse Rate:  [56-75] 58 (02/06 0804) Resp:  [16-18] 17 (02/06 0804) BP: (119-131)/(69-88) 120/88 (02/06 0804) SpO2:  [99 %-100 %] 100 % (02/06 0804) Weight:  [62.9 kg] 62.9 kg (02/06 0500)  Intake/Output from previous day:  Intake/Output Summary (Last 24 hours) at 04/03/2024 1027 Last data filed at 04/03/2024 0548 Gross per 24 hour  Intake 480 ml  Output 1175 ml  Net -695 ml    Intake/Output this shift: No intake/output data recorded.  Labs: Recent Labs    04/01/24 0614  HGB 13.8   Recent Labs    04/01/24 0614  WBC 7.0  RBC 4.10*  HCT 38.8*  PLT 196   Recent Labs    04/01/24 0728  NA 135  K 4.7  CL 99  CO2 28  BUN 14  CREATININE 0.81  GLUCOSE 97  CALCIUM 8.8*   No results for input(s): LABPT, INR in the last 72 hours.   EXAM General - Patient is Alert and Oriented Extremity - Neurovascular intact Sensation intact distally Dorsiflexion/Plantar flexion intact Dressing/Incision - clean, dry, no drainage, with the knee immobilizer locked in extension. Motor Function - intact, moving foot and toes well on exam. Ambulated 100 feet with PT.    Past Medical History:  Diagnosis Date   Alcoholism (HCC)    Anxiety    Chest pain    Cocaine abuse with cocaine-induced mood disorder (HCC) 11/11/2017   Depression    Pericarditis    Schizophrenia (HCC)     Assessment/Plan: 2 Days Post-Op Procedures (LRB): OPEN REDUCTION INTERNAL FIXATION (ORIF) PATELLA (Right) Principal Problem:   Displaced comminuted  fracture of right patella, initial encounter for closed fracture Active Problems:   Polysubstance abuse (HCC)   Adjustment disorder with mixed anxiety and depressed mood   MDD (major depressive disorder), recurrent severe, without psychosis (HCC)  Estimated body mass index is 21.09 kg/m as calculated from the following:   Height as of 03/28/24: 5' 8 (1.727 m).   Weight as of this encounter: 62.9 kg. Advance diet Up with therapy  Discharge planning: No dressing change at this time.  No showering or getting.  Bandage wet.  Knee immobilizer locked in extension at all times with no flexion.  Weightbearing as tolerated on the right.  Follow-up at [facility] clinic orthopedics in 2 weeks for wound care.  DVT Prophylaxis - Aspirin  Weight-Bearing as tolerated to right leg in the brace locked in extension.  Krystal Doyne, PA-C Orthopaedic Surgery 04/03/2024, 10:27 AM  "
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
## 2024-04-03 NOTE — Plan of Care (Signed)
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
## 2024-04-03 NOTE — TOC CM/SW Note (Signed)
 Transition of Care (TOC) CM/SW Note   The patient has a mobility limitation that significantly impairs their ability to participate in one or more mobility-related activities of daily living in the home. The Patient is able to safety use the walker. The functional mobility deficit can be sufficiently resolved by the use of a walker    Patient is not able to walk the distance required to go the bathroom, or he/she is unable to safely negotiate stairs required to access the bathroom.  A 3in1 BSC will alleviate this problem
